# Patient Record
Sex: Female | Born: 1960 | Race: Black or African American | Hispanic: No | Marital: Single | Smoking: Never smoker
Health system: Southern US, Community
[De-identification: ages and names within clinical notes are randomized; demographics above are authoritative.]

## PROBLEM LIST (undated history)

## (undated) DIAGNOSIS — Z21 Asymptomatic human immunodeficiency virus [HIV] infection status: Secondary | ICD-10-CM

## (undated) DIAGNOSIS — M170 Bilateral primary osteoarthritis of knee: Secondary | ICD-10-CM

## (undated) DIAGNOSIS — I10 Essential (primary) hypertension: Secondary | ICD-10-CM

## (undated) DIAGNOSIS — J45909 Unspecified asthma, uncomplicated: Secondary | ICD-10-CM

## (undated) DIAGNOSIS — E785 Hyperlipidemia, unspecified: Secondary | ICD-10-CM

## (undated) DIAGNOSIS — M549 Dorsalgia, unspecified: Secondary | ICD-10-CM

## (undated) DIAGNOSIS — F419 Anxiety disorder, unspecified: Secondary | ICD-10-CM

## (undated) DIAGNOSIS — B20 Human immunodeficiency virus [HIV] disease: Secondary | ICD-10-CM

## (undated) DIAGNOSIS — R000 Tachycardia, unspecified: Secondary | ICD-10-CM

## (undated) DIAGNOSIS — F32A Depression, unspecified: Secondary | ICD-10-CM

## (undated) DIAGNOSIS — T7840XA Allergy, unspecified, initial encounter: Secondary | ICD-10-CM

## (undated) DIAGNOSIS — K219 Gastro-esophageal reflux disease without esophagitis: Secondary | ICD-10-CM

## (undated) HISTORY — DX: Anxiety disorder, unspecified: F41.9

## (undated) HISTORY — DX: Hyperlipidemia, unspecified: E78.5

## (undated) HISTORY — DX: Essential (primary) hypertension: I10

## (undated) HISTORY — DX: Depression, unspecified: F32.A

## (undated) HISTORY — DX: Allergy, unspecified, initial encounter: T78.40XA

## (undated) HISTORY — DX: Dorsalgia, unspecified: M54.9

## (undated) HISTORY — PX: NO PAST SURGERIES: SHX2092

## (undated) HISTORY — DX: Tachycardia, unspecified: R00.0

## (undated) HISTORY — DX: Human immunodeficiency virus (HIV) disease: B20

## (undated) HISTORY — DX: Asymptomatic human immunodeficiency virus (hiv) infection status: Z21

## (undated) HISTORY — DX: Bilateral primary osteoarthritis of knee: M17.0

---

## 2006-03-19 ENCOUNTER — Ambulatory Visit: Payer: Self-pay | Admitting: Infectious Diseases

## 2006-08-21 ENCOUNTER — Ambulatory Visit: Payer: Self-pay | Admitting: Infectious Diseases

## 2007-01-30 ENCOUNTER — Ambulatory Visit: Payer: Self-pay | Admitting: Infectious Diseases

## 2007-02-26 ENCOUNTER — Encounter: Payer: Self-pay | Admitting: Infectious Diseases

## 2007-02-28 ENCOUNTER — Encounter: Payer: Self-pay | Admitting: Infectious Diseases

## 2007-03-07 ENCOUNTER — Encounter: Admission: RE | Admit: 2007-03-07 | Discharge: 2007-03-07 | Payer: Self-pay | Admitting: Infectious Diseases

## 2007-03-07 ENCOUNTER — Ambulatory Visit: Payer: Self-pay | Admitting: Infectious Diseases

## 2007-03-07 ENCOUNTER — Encounter (INDEPENDENT_AMBULATORY_CARE_PROVIDER_SITE_OTHER): Payer: Self-pay | Admitting: *Deleted

## 2007-03-07 DIAGNOSIS — B20 Human immunodeficiency virus [HIV] disease: Secondary | ICD-10-CM | POA: Insufficient documentation

## 2007-03-07 LAB — CONVERTED CEMR LAB
ALT: 32 units/L (ref 0–35)
AST: 34 units/L (ref 0–37)
Basophils Relative: 0 % (ref 0–1)
CO2: 17 meq/L — ABNORMAL LOW (ref 19–32)
Chlamydia, Swab/Urine, PCR: POSITIVE — AB
Chloride: 107 meq/L (ref 96–112)
Cholesterol: 299 mg/dL — ABNORMAL HIGH (ref 0–200)
Creatinine, Ser: 0.82 mg/dL (ref 0.40–1.20)
HCT: 43.2 % (ref 36.0–46.0)
HIV-2 Ab: UNDETERMINED — AB
Hemoglobin: 14.5 g/dL (ref 12.0–15.0)
Hep B Core Total Ab: POSITIVE — AB
Ketones, ur: NEGATIVE mg/dL
Lymphs Abs: 2.9 10*3/uL (ref 0.7–3.3)
Monocytes Absolute: 0.5 10*3/uL (ref 0.2–0.7)
Monocytes Relative: 7 % (ref 3–11)
Platelets: 322 10*3/uL (ref 150–400)
Protein, ur: NEGATIVE mg/dL
RBC: 4.39 M/uL (ref 3.87–5.11)
RDW: 13.4 % (ref 11.5–14.0)
Specific Gravity, Urine: 1.031 (ref 1.005–1.03)
Total Bilirubin: 0.2 mg/dL — ABNORMAL LOW (ref 0.3–1.2)
Total Protein: 7.6 g/dL (ref 6.0–8.3)
Triglycerides: 379 mg/dL — ABNORMAL HIGH (ref <150)
Urine Glucose: NEGATIVE mg/dL
VLDL: 76 mg/dL — ABNORMAL HIGH (ref 0–40)
pH: 5.5 (ref 5.0–8.0)

## 2007-03-11 ENCOUNTER — Telehealth: Payer: Self-pay

## 2007-03-19 ENCOUNTER — Telehealth: Payer: Self-pay | Admitting: Infectious Diseases

## 2007-04-08 ENCOUNTER — Telehealth: Payer: Self-pay | Admitting: Infectious Diseases

## 2007-04-11 ENCOUNTER — Encounter (INDEPENDENT_AMBULATORY_CARE_PROVIDER_SITE_OTHER): Payer: Self-pay | Admitting: *Deleted

## 2007-04-11 ENCOUNTER — Telehealth: Payer: Self-pay | Admitting: Infectious Diseases

## 2007-04-15 ENCOUNTER — Ambulatory Visit: Payer: Self-pay | Admitting: Infectious Diseases

## 2007-04-15 DIAGNOSIS — I10 Essential (primary) hypertension: Secondary | ICD-10-CM | POA: Insufficient documentation

## 2007-04-15 DIAGNOSIS — F4310 Post-traumatic stress disorder, unspecified: Secondary | ICD-10-CM

## 2007-04-15 DIAGNOSIS — J45909 Unspecified asthma, uncomplicated: Secondary | ICD-10-CM

## 2007-04-15 DIAGNOSIS — F329 Major depressive disorder, single episode, unspecified: Secondary | ICD-10-CM | POA: Insufficient documentation

## 2007-04-17 ENCOUNTER — Telehealth: Payer: Self-pay | Admitting: Infectious Diseases

## 2007-04-23 ENCOUNTER — Telehealth: Payer: Self-pay | Admitting: Infectious Diseases

## 2007-05-07 ENCOUNTER — Ambulatory Visit: Payer: Self-pay

## 2007-05-07 ENCOUNTER — Encounter: Payer: Self-pay | Admitting: Infectious Diseases

## 2007-05-20 ENCOUNTER — Telehealth: Payer: Self-pay | Admitting: Infectious Diseases

## 2007-05-22 ENCOUNTER — Telehealth (INDEPENDENT_AMBULATORY_CARE_PROVIDER_SITE_OTHER): Payer: Self-pay | Admitting: *Deleted

## 2007-05-30 ENCOUNTER — Telehealth: Payer: Self-pay | Admitting: Infectious Diseases

## 2007-06-03 ENCOUNTER — Encounter: Payer: Self-pay | Admitting: Infectious Diseases

## 2007-06-03 ENCOUNTER — Telehealth: Payer: Self-pay | Admitting: Infectious Diseases

## 2007-06-26 ENCOUNTER — Telehealth: Payer: Self-pay | Admitting: Infectious Diseases

## 2007-07-10 ENCOUNTER — Telehealth (INDEPENDENT_AMBULATORY_CARE_PROVIDER_SITE_OTHER): Payer: Self-pay | Admitting: *Deleted

## 2007-07-22 ENCOUNTER — Telehealth: Payer: Self-pay | Admitting: Infectious Diseases

## 2007-07-22 ENCOUNTER — Ambulatory Visit: Payer: Self-pay | Admitting: Infectious Diseases

## 2007-07-22 ENCOUNTER — Encounter: Admission: RE | Admit: 2007-07-22 | Discharge: 2007-07-22 | Payer: Self-pay | Admitting: Infectious Diseases

## 2007-07-22 LAB — CONVERTED CEMR LAB
ALT: 30 units/L (ref 0–35)
AST: 25 units/L (ref 0–37)
Basophils Absolute: 0 10*3/uL (ref 0.0–0.1)
CO2: 23 meq/L (ref 19–32)
Glucose, Bld: 90 mg/dL (ref 70–99)
Lymphs Abs: 2.1 10*3/uL (ref 0.7–3.3)
MCHC: 33.3 g/dL (ref 30.0–36.0)
MCV: 96.4 fL (ref 78.0–100.0)
Monocytes Absolute: 0.3 10*3/uL (ref 0.2–0.7)
Monocytes Relative: 6 % (ref 3–11)
Potassium: 4.7 meq/L (ref 3.5–5.3)
RBC: 4.43 M/uL (ref 3.87–5.11)
RDW: 13.1 % (ref 11.5–14.0)
Sodium: 140 meq/L (ref 135–145)
WBC: 4.9 10*3/uL (ref 4.0–10.5)

## 2007-09-04 ENCOUNTER — Telehealth: Payer: Self-pay | Admitting: Infectious Diseases

## 2007-09-09 ENCOUNTER — Ambulatory Visit: Payer: Self-pay | Admitting: Infectious Diseases

## 2007-09-11 ENCOUNTER — Encounter: Payer: Self-pay | Admitting: Infectious Diseases

## 2007-09-17 ENCOUNTER — Telehealth: Payer: Self-pay | Admitting: Infectious Diseases

## 2007-09-20 ENCOUNTER — Inpatient Hospital Stay: Payer: Self-pay | Admitting: Vascular Surgery

## 2007-09-25 ENCOUNTER — Telehealth: Payer: Self-pay | Admitting: Infectious Diseases

## 2007-10-07 ENCOUNTER — Telehealth: Payer: Self-pay | Admitting: Infectious Diseases

## 2007-10-07 ENCOUNTER — Encounter: Payer: Self-pay | Admitting: Infectious Diseases

## 2007-11-08 ENCOUNTER — Telehealth: Payer: Self-pay | Admitting: Infectious Diseases

## 2007-11-25 ENCOUNTER — Encounter: Payer: Self-pay | Admitting: Infectious Diseases

## 2007-11-26 ENCOUNTER — Encounter (INDEPENDENT_AMBULATORY_CARE_PROVIDER_SITE_OTHER): Payer: Self-pay | Admitting: *Deleted

## 2008-01-29 ENCOUNTER — Ambulatory Visit: Payer: Self-pay | Admitting: Infectious Diseases

## 2008-01-29 ENCOUNTER — Encounter: Admission: RE | Admit: 2008-01-29 | Discharge: 2008-01-29 | Payer: Self-pay | Admitting: Infectious Diseases

## 2008-01-29 LAB — CONVERTED CEMR LAB: HIV-1 RNA Quant, Log: <1.7 (ref <1.70)

## 2008-01-31 LAB — CONVERTED CEMR LAB
AST: 27 units/L (ref 0–37)
Albumin: 4.3 g/dL (ref 3.5–5.2)
Alkaline Phosphatase: 77 units/L (ref 39–117)
BUN: 16 mg/dL (ref 6–23)
Calcium: 9.6 mg/dL (ref 8.4–10.5)
HDL: 89 mg/dL (ref >39)
Hemoglobin: 12.7 g/dL (ref 12.0–15.0)
LDL Cholesterol: 165 mg/dL — ABNORMAL HIGH (ref 0–99)
MCHC: 32.1 g/dL (ref 30.0–36.0)
Monocytes Absolute: 0.3 10*3/uL (ref 0.1–1.0)
Monocytes Relative: 5 % (ref 3–12)
Neutro Abs: 2.8 10*3/uL (ref 1.7–7.7)
Neutrophils Relative %: 51 % (ref 43–77)
Platelets: 305 10*3/uL (ref 150–400)
Total CHOL/HDL Ratio: 3.2
Total Protein: 7.2 g/dL (ref 6.0–8.3)
WBC: 5.5 10*3/uL (ref 4.0–10.5)

## 2008-02-14 ENCOUNTER — Encounter (INDEPENDENT_AMBULATORY_CARE_PROVIDER_SITE_OTHER): Payer: Self-pay | Admitting: *Deleted

## 2008-04-01 ENCOUNTER — Ambulatory Visit: Payer: Self-pay | Admitting: Infectious Diseases

## 2008-04-01 DIAGNOSIS — K219 Gastro-esophageal reflux disease without esophagitis: Secondary | ICD-10-CM

## 2008-04-14 ENCOUNTER — Encounter: Payer: Self-pay | Admitting: Infectious Diseases

## 2008-09-14 ENCOUNTER — Ambulatory Visit: Payer: Self-pay | Admitting: Infectious Diseases

## 2008-09-14 ENCOUNTER — Encounter: Payer: Self-pay | Admitting: Infectious Diseases

## 2008-09-14 ENCOUNTER — Ambulatory Visit: Payer: Self-pay | Admitting: Internal Medicine

## 2008-09-14 LAB — CONVERTED CEMR LAB
ALT: 36 units/L — ABNORMAL HIGH (ref 0–35)
Alkaline Phosphatase: 92 units/L (ref 39–117)
BUN: 14 mg/dL (ref 6–23)
Basophils Relative: 0 % (ref 0–1)
Calcium: 9.5 mg/dL (ref 8.4–10.5)
Chloride: 104 meq/L (ref 96–112)
Eosinophils Absolute: 0.2 10*3/uL (ref 0.0–0.7)
Eosinophils Relative: 3 % (ref 0–5)
Lymphocytes Relative: 50 % — ABNORMAL HIGH (ref 12–46)
MCHC: 34.6 g/dL (ref 30.0–36.0)
MCV: 94.2 fL (ref 78.0–100.0)
Platelets: 336 10*3/uL (ref 150–400)
Potassium: 4.7 meq/L (ref 3.5–5.3)
Total Bilirubin: 0.3 mg/dL (ref 0.3–1.2)

## 2008-09-28 ENCOUNTER — Encounter (INDEPENDENT_AMBULATORY_CARE_PROVIDER_SITE_OTHER): Payer: Self-pay | Admitting: *Deleted

## 2008-09-28 ENCOUNTER — Ambulatory Visit: Payer: Self-pay | Admitting: Infectious Diseases

## 2009-03-02 ENCOUNTER — Encounter: Payer: Self-pay | Admitting: Infectious Diseases

## 2009-04-06 ENCOUNTER — Telehealth: Payer: Self-pay | Admitting: Infectious Diseases

## 2009-04-14 ENCOUNTER — Ambulatory Visit: Payer: Self-pay | Admitting: Infectious Diseases

## 2009-04-14 LAB — CONVERTED CEMR LAB
AST: 34 units/L (ref 0–37)
Alkaline Phosphatase: 87 units/L (ref 39–117)
BUN: 16 mg/dL (ref 6–23)
Basophils Absolute: 0 10*3/uL (ref 0.0–0.1)
Basophils Relative: 0 % (ref 0–1)
Calcium: 9.7 mg/dL (ref 8.4–10.5)
Cholesterol: 281 mg/dL — ABNORMAL HIGH (ref 0–200)
Creatinine, Ser: 0.71 mg/dL (ref 0.40–1.20)
Eosinophils Absolute: 0.2 10*3/uL (ref 0.0–0.7)
GFR calc Af Amer: >60 mL/min (ref >60)
GFR calc non Af Amer: >60 mL/min (ref >60)
HCT: 40.9 % (ref 36.0–46.0)
HDL: 75 mg/dL (ref >39)
HIV 1 RNA Quant: <48 {copies}/mL
HIV-1 RNA Quant, Log: <1.68
LDL Cholesterol: 179 mg/dL — ABNORMAL HIGH (ref 0–99)
MCV: 96.5 fL (ref 78.0–100.0)
Neutro Abs: 3.6 10*3/uL (ref 1.7–7.7)
Platelets: 307 10*3/uL (ref 150–400)
Potassium: 4.9 meq/L (ref 3.5–5.3)
Sodium: 140 meq/L (ref 135–145)
Total Bilirubin: 0.2 mg/dL — ABNORMAL LOW (ref 0.3–1.2)
Total CHOL/HDL Ratio: 3.7
Triglycerides: 135 mg/dL (ref <150)
VLDL: 27 mg/dL (ref 0–40)

## 2009-04-20 ENCOUNTER — Telehealth (INDEPENDENT_AMBULATORY_CARE_PROVIDER_SITE_OTHER): Payer: Self-pay | Admitting: *Deleted

## 2009-05-03 ENCOUNTER — Ambulatory Visit: Payer: Self-pay | Admitting: Infectious Diseases

## 2009-05-06 ENCOUNTER — Telehealth: Payer: Self-pay | Admitting: Infectious Diseases

## 2009-09-24 ENCOUNTER — Encounter: Payer: Self-pay | Admitting: Infectious Diseases

## 2009-09-24 ENCOUNTER — Encounter (INDEPENDENT_AMBULATORY_CARE_PROVIDER_SITE_OTHER): Payer: Self-pay | Admitting: *Deleted

## 2009-09-24 ENCOUNTER — Ambulatory Visit: Payer: Self-pay | Admitting: Internal Medicine

## 2009-09-24 ENCOUNTER — Ambulatory Visit: Payer: Self-pay | Admitting: Infectious Diseases

## 2009-09-27 ENCOUNTER — Telehealth: Payer: Self-pay | Admitting: Infectious Diseases

## 2009-09-28 ENCOUNTER — Encounter: Payer: Self-pay | Admitting: Infectious Diseases

## 2009-09-29 ENCOUNTER — Encounter: Payer: Self-pay | Admitting: Infectious Diseases

## 2009-10-04 ENCOUNTER — Ambulatory Visit: Payer: Self-pay | Admitting: Infectious Diseases

## 2009-10-04 ENCOUNTER — Telehealth: Payer: Self-pay | Admitting: Infectious Diseases

## 2009-10-04 ENCOUNTER — Ambulatory Visit (HOSPITAL_COMMUNITY): Admission: RE | Admit: 2009-10-04 | Discharge: 2009-10-04 | Payer: Self-pay | Admitting: Infectious Diseases

## 2009-10-04 LAB — CONVERTED CEMR LAB
ALT: 25 units/L (ref 0–35)
AST: 19 units/L (ref 0–37)
Albumin: 4.3 g/dL (ref 3.5–5.2)
Alkaline Phosphatase: 83 units/L (ref 39–117)
Basophils Absolute: 0 10*3/uL (ref 0.0–0.1)
Basophils Relative: 0 % (ref 0–1)
Calcium: 9.4 mg/dL (ref 8.4–10.5)
Eosinophils Relative: 2 % (ref 0–5)
HCT: 39.9 % (ref 36.0–46.0)
HIV 1 RNA Quant: 55 copies/mL — ABNORMAL HIGH (ref <48)
Hemoglobin: 13.2 g/dL (ref 12.0–15.0)
MCV: 96.1 fL (ref >=78.0)
Potassium: 4.6 meq/L (ref 3.5–5.3)
RBC: 4.15 M/uL (ref 3.87–5.11)
Total Protein: 7 g/dL (ref 6.0–8.3)
WBC: 10.4 10*3/uL (ref 4.0–10.5)

## 2009-10-05 ENCOUNTER — Encounter (INDEPENDENT_AMBULATORY_CARE_PROVIDER_SITE_OTHER): Payer: Self-pay | Admitting: *Deleted

## 2009-10-15 ENCOUNTER — Telehealth (INDEPENDENT_AMBULATORY_CARE_PROVIDER_SITE_OTHER): Payer: Self-pay | Admitting: *Deleted

## 2009-10-18 ENCOUNTER — Ambulatory Visit: Payer: Self-pay | Admitting: Infectious Diseases

## 2009-10-25 ENCOUNTER — Telehealth: Payer: Self-pay | Admitting: Infectious Diseases

## 2009-10-29 ENCOUNTER — Encounter: Payer: Self-pay | Admitting: Infectious Diseases

## 2009-11-15 ENCOUNTER — Telehealth: Payer: Self-pay | Admitting: Infectious Diseases

## 2009-12-01 ENCOUNTER — Telehealth (INDEPENDENT_AMBULATORY_CARE_PROVIDER_SITE_OTHER): Payer: Self-pay | Admitting: *Deleted

## 2009-12-06 ENCOUNTER — Telehealth (INDEPENDENT_AMBULATORY_CARE_PROVIDER_SITE_OTHER): Payer: Self-pay | Admitting: *Deleted

## 2009-12-07 ENCOUNTER — Encounter: Payer: Self-pay | Admitting: Infectious Diseases

## 2009-12-29 ENCOUNTER — Telehealth (INDEPENDENT_AMBULATORY_CARE_PROVIDER_SITE_OTHER): Payer: Self-pay | Admitting: *Deleted

## 2009-12-31 ENCOUNTER — Encounter: Payer: Self-pay | Admitting: Infectious Diseases

## 2010-03-14 ENCOUNTER — Encounter: Payer: Self-pay | Admitting: Infectious Diseases

## 2010-04-27 ENCOUNTER — Ambulatory Visit: Payer: Self-pay | Admitting: Infectious Diseases

## 2010-04-27 LAB — CONVERTED CEMR LAB
ALT: 27 units/L (ref 0–35)
Albumin: 4.3 g/dL (ref 3.5–5.2)
Alkaline Phosphatase: 68 units/L (ref 39–117)
BUN: 11 mg/dL (ref 6–23)
CO2: 22 meq/L (ref 19–32)
Cholesterol: 297 mg/dL — ABNORMAL HIGH (ref 0–200)
Creatinine, Ser: 0.68 mg/dL (ref 0.40–1.20)
Eosinophils Relative: 3 % (ref 0–5)
Glucose, Bld: 123 mg/dL — ABNORMAL HIGH (ref 70–99)
Hemoglobin: 13 g/dL (ref 12.0–15.0)
Lymphs Abs: 3.3 10*3/uL (ref 0.7–4.0)
MCHC: 33.8 g/dL (ref 30.0–36.0)
MCV: 94.1 fL (ref 78.0–100.0)
Monocytes Relative: 6 % (ref 3–12)
Platelets: 308 10*3/uL (ref 150–400)
RBC: 4.09 M/uL (ref 3.87–5.11)
RDW: 12.7 % (ref 11.5–15.5)
Sodium: 138 meq/L (ref 135–145)
Total CHOL/HDL Ratio: 3.8
Total Protein: 6.8 g/dL (ref 6.0–8.3)
Triglycerides: 152 mg/dL — ABNORMAL HIGH (ref <150)
VLDL: 30 mg/dL (ref 0–40)

## 2010-05-23 ENCOUNTER — Ambulatory Visit: Payer: Self-pay | Admitting: Infectious Diseases

## 2010-05-23 DIAGNOSIS — E785 Hyperlipidemia, unspecified: Secondary | ICD-10-CM | POA: Insufficient documentation

## 2010-05-26 ENCOUNTER — Telehealth (INDEPENDENT_AMBULATORY_CARE_PROVIDER_SITE_OTHER): Payer: Self-pay | Admitting: *Deleted

## 2010-09-12 ENCOUNTER — Ambulatory Visit: Payer: Self-pay | Admitting: Infectious Diseases

## 2010-09-12 LAB — CONVERTED CEMR LAB
ALT: 25 units/L (ref 0–35)
AST: 21 units/L (ref 0–37)
BUN: 12 mg/dL (ref 6–23)
Basophils Absolute: 0 10*3/uL (ref 0.0–0.1)
CO2: 21 meq/L (ref 19–32)
Glucose, Bld: 98 mg/dL (ref 70–99)
HDL: 70 mg/dL (ref >39)
HIV 1 RNA Quant: <20 copies/mL (ref <20)
HIV-1 RNA Quant, Log: <1.3 (ref <1.30)
Hemoglobin: 13.5 g/dL (ref 12.0–15.0)
Lymphocytes Relative: 48 % — ABNORMAL HIGH (ref 12–46)
Lymphs Abs: 2.8 10*3/uL (ref 0.7–4.0)
MCHC: 34.1 g/dL (ref 30.0–36.0)
Monocytes Absolute: 0.4 10*3/uL (ref 0.1–1.0)
Neutrophils Relative %: 42 % — ABNORMAL LOW (ref 43–77)
Platelets: 315 10*3/uL (ref 150–400)
RBC: 4.2 M/uL (ref 3.87–5.11)
RDW: 12.9 % (ref 11.5–15.5)
Total Bilirubin: 0.2 mg/dL — ABNORMAL LOW (ref 0.3–1.2)
Total CHOL/HDL Ratio: 3.9
Total Protein: 6.5 g/dL (ref 6.0–8.3)
Triglycerides: 183 mg/dL — ABNORMAL HIGH (ref <150)
VLDL: 37 mg/dL (ref 0–40)

## 2010-09-16 ENCOUNTER — Ambulatory Visit: Payer: Self-pay | Admitting: Infectious Diseases

## 2010-09-22 ENCOUNTER — Encounter: Payer: Self-pay | Admitting: Infectious Diseases

## 2010-09-28 ENCOUNTER — Ambulatory Visit: Payer: Self-pay | Admitting: Infectious Diseases

## 2010-10-24 ENCOUNTER — Encounter (INDEPENDENT_AMBULATORY_CARE_PROVIDER_SITE_OTHER): Payer: Self-pay | Admitting: *Deleted

## 2010-11-09 ENCOUNTER — Encounter (INDEPENDENT_AMBULATORY_CARE_PROVIDER_SITE_OTHER): Payer: Self-pay | Admitting: *Deleted

## 2010-11-17 ENCOUNTER — Ambulatory Visit (HOSPITAL_COMMUNITY)
Admission: RE | Admit: 2010-11-17 | Discharge: 2010-11-17 | Payer: Self-pay | Source: Home / Self Care | Attending: Infectious Diseases | Admitting: Infectious Diseases

## 2010-12-19 ENCOUNTER — Encounter: Payer: Self-pay | Admitting: Infectious Diseases

## 2010-12-27 NOTE — Letter (Signed)
Summary: Results Follow-up Letter  [facility] for Infectious Disease  [address]   [place], [place] [address]   Phone: [phone]  Fax: [phone]          September 22, 2010  [address] [place], [place]  [address]  Dear Ms. Zegarra,   The following are the results of your recent test(s):  Test     Result     Pap Smear    Normal_XXX___  Not Normal_____  Comments:  Everything was normal.  I will see you in one year for your next PAP smear.    Thank you for coming to the Center for your care.  Sincerely,    Jennet Maduro [facility] for Infectious Disease

## 2010-12-27 NOTE — Consult Note (Signed)
Summary: [facility]   Imported By: Florinda Marker 12/07/2009 14:20:55  _____________________________________________________________________  External Attachment:    Type:   Image     Comment:   External Document

## 2010-12-27 NOTE — Progress Notes (Signed)
Summary: Referral for MNT/dmr  Phone Note Outgoing Call   Summary of Call: Tried calling patient to explain that Medicare benefits do not cover MNT for her diagnosis and invite her to attend free extremem makeover group meeting July 12th. no answer. Mailed letter with appointment day and time for MNT, likelihood of no insurance coverage and flyer for free class as well as [facility] phone number at [facility] for her to check to see if they have any services that could benefit her.

## 2010-12-27 NOTE — Miscellaneous (Signed)
Summary: RW Financial Update  Clinical Lists Changes  Observations: Added new observation of FINASSESSDT: 09/28/2010 (10/24/2010 13:55) Added new observation of YEARLYEXPEN: 0  (10/24/2010 13:55)

## 2010-12-27 NOTE — Assessment & Plan Note (Signed)
Summary: PAP SMEAR + "Cyst-like" growth on inner L thigh, ? Surg. Ref?   Vitals Entered By: Sheri Maduro RN (September 16, 2010 2:13 PM)  Patient Instructions: 1)  Please schedule a follow-up appointment in 1 year. 2)  I will send your results to in about a week. 3)  Thank you for coming to the Center for your care.  Sheri Davis  CC: PAP smear visit. Given condoms.  Given educastional materials re:  HIV and women, self esteem, BSE, diet, exercise and nutrition.  Pt. asked RN to look at "cyst" on her thigh.  It was a problem this summer when she was walking more on her vacation.  RN reminded pt. to talk with Dr. Ninetta Davis about this at her next visit.  Is Patient Diabetic? No  Prior Medications: ATRIPLA 600-200-300 MG TABS (EFAVIRENZ-EMTRICITAB-TENOFOVIR) Take 1 tablet by mouth at bedtime ATENOLOL 25 MG TABS (ATENOLOL) Take 1 tablet by mouth once a day TRIAMCINOLONE ACETONIDE 0.1 % CREA (TRIAMCINOLONE ACETONIDE) two times a day as needed to affected areas CROMOLYN SODIUM 4 % SOLN (CROMOLYN SODIUM) as needed for eye dyscomfort VENTOLIN HFA 108 (90 BASE) MCG/ACT AERS (ALBUTEROL SULFATE) 1 puff qid as needed for shortness of breath FLOVENT HFA 110 MCG/ACT AERO (FLUTICASONE PROPIONATE  HFA) once daily inhaled LORAZEPAM 1 MG  TABS (LORAZEPAM) One tablet by mouth every eight hours as needed PREVACID 30 MG CPDR (LANSOPRAZOLE) take two daily Current Allergies: No known allergies  Orders Added: 1)  T-PAP [facility]) [[id number]] 2)  Est. Patient Level I [[address]]

## 2010-12-27 NOTE — Assessment & Plan Note (Signed)
Summary: F/U [MKJ]   CC:  follow-up visit.  History of Present Illness: 50 yo F with HIV+ 2007.  Has been on atripla since dx. Also taking herbal supplements.  CD4 1100  VL <20, Chol 262 (09-12-10).  "I'm fine, getting fatter and fatter." no problems with ART. Has had cyst on her inner thigh for 20+ years, has not grown. Was irritating to her at her last vacation when she was up walking alot.   Preventive Screening-Counseling & Management  Alcohol-Tobacco     Alcohol drinks/day: 0     Smoking Status: quit     Packs/Day: 1     Year Quit: 2008  Caffeine-Diet-Exercise     Caffeine use/day: herbal tea     Does Patient Exercise: yes     Type of exercise: walking and going to the [facility]     Exercise (avg: min/session): 30-60     Times/week: 3  Hep-HIV-STD-Contraception     HIV Risk: no risk noted     HIV Risk Counseling: not indicated-no HIV risk noted  Safety-Violence-Falls     Seat Belt Use: yes  Comments: given condoms      Sexual History:  n/a.        Drug Use:  never.     Updated Prior Medication List: ATRIPLA 600-200-300 MG TABS (EFAVIRENZ-EMTRICITAB-TENOFOVIR) Take 1 tablet by mouth at bedtime ATENOLOL 25 MG TABS (ATENOLOL) Take 1 tablet by mouth once a day TRIAMCINOLONE ACETONIDE 0.1 % CREA (TRIAMCINOLONE ACETONIDE) two times a day as needed to affected areas CROMOLYN SODIUM 4 % SOLN (CROMOLYN SODIUM) as needed for eye dyscomfort VENTOLIN HFA 108 (90 BASE) MCG/ACT AERS (ALBUTEROL SULFATE) 1 puff qid as needed for shortness of breath FLOVENT HFA 110 MCG/ACT AERO (FLUTICASONE PROPIONATE  HFA) once daily inhaled LORAZEPAM 1 MG  TABS (LORAZEPAM) One tablet by mouth every eight hours as needed PREVACID 30 MG CPDR (LANSOPRAZOLE) take two daily  Current Allergies (reviewed today): No known allergies  Past History:  Past medical, surgical, family and social histories (including risk factors) reviewed, and no changes noted (except as noted below).  Past Medical  History: Reviewed history from 05/23/2010 and no changes required. Depression HIV disease Asthma Hypertension GERD Hyperlipidemia  Family History: Reviewed history from 10/18/2009 and no changes required. mother deceased with CHF, depression, bipolar. diabetes.   Social History: Reviewed history from 05/03/2009 and no changes required. Alcohol use-no Drug use-no Former Smoker Sexual History:  n/a Drug Use:  never  Vital Signs:  Patient profile:   50 year old female Menstrual status:  postmenopausal Height:      65 inches (165.10 cm) Weight:      233.25 pounds (106.02 kg) BMI:     38.96 Temp:     99.0 degrees F (37.22 degrees C) oral Pulse rate:   80 / minute BP sitting:   117 / 81  (right arm) Cuff size:   large  Vitals Entered By: Jennet Maduro RN (September 28, 2010 4:19 PM) CC: follow-up visit Is Patient Diabetic? No Pain Assessment Patient in pain? no      Nutritional Status BMI of > 30 = obese Nutritional Status Detail appetite "great"  Have you ever been in a relationship where you felt threatened, hurt or afraid?No   Does patient need assistance? Functional Status Self care Ambulation Normal Comments noi missed doses of rxes   Physical Exam  General:  well-developed, well-nourished, and well-hydrated.   Eyes:  pupils equal, pupils round, and pupils reactive  to light.   Mouth:  pharynx pink and moist and no exudates.   Neck:  no masses.   Lungs:  normal respiratory effort and normal breath sounds.   Heart:  normal rate, regular rhythm, and no murmur.   Abdomen:  soft, non-tender, and normal bowel sounds.   Skin:  2-3 cm protruding lesion on her L upper thigh. it is reducible and has a cystic feel. there is  no increase in heat or tenderness.         Medication Adherence: 09/28/2010   Adherence to medications reviewed with patient. Counseling to provide adequate adherence provided   Prevention For Positives: 09/28/2010   Safe sex practices  discussed with patient. Condoms offered.                             Impression & Recommendations:  Problem # 1:  HIV DISEASE (ICD-042)  she is doing well. she is given condoms ("i'm celebate"). will cont her ART. will refer her to surgery for eval of the lesion on her leg. return to clinic 5-6 months.   Orders: Surgical Referral (Surgery)Future Orders: T-TSH ([id number]) ... 12/27/2010  Problem # 2:  HYPERLIPIDEMIA (ICD-272.4)  she asks about wt loss- i encouraged her to exercise and watch her diet. cont to watch her lipids. wants TSH checked at next blood draw.    Future Orders: T-TSH [id number]) ... 12/27/2010  Problem # 3:  HYPERTENSION (ICD-401.9) he BP rx is refilled. will cont to watch.  Her updated medication list for this problem includes:    Atenolol 25 Mg Tabs (Atenolol) .Marland Kitchen... Take 1 tablet by mouth once a day  Problem # 4:  G E R D (ICD-530.81) her prevacid is refilled.  Her updated medication list for this problem includes:    Prevacid 30 Mg Cpdr (Lansoprazole) .Marland Kitchen... Take two daily  Medications Added to Medication List This Visit: 1)  Atenolol 25 Mg Tabs (Atenolol) .... Take 1 tablet by mouth once a day  Other Orders: Est. Patient Level IV (284[address]) Future Orders: T-CD4SP ([facility]) (CD4SP) ... 12/27/2010 T-HIV Viral Load [id number]) ... 12/27/2010 T-Comprehensive Metabolic Panel [id number]) ... 12/27/2010 T-CBC w/Diff ([id number]) ... 12/27/2010 T-RPR (Syphilis) [id number]) ... 12/27/2010 T-Lipid Profile [id number]) ... 12/27/2010  Prescriptions: ATENOLOL 25 MG TABS (ATENOLOL) Take 1 tablet by mouth once a day  #90 x 3   Entered and Authorized by:   Johny Sax MD   Signed by:   Johny Sax MD on 09/28/2010   Method used:   Electronically to        [facility] (retail)       [address]       [place], [place]  [address]       Ph: [phone]       Fax: [phone]   RxID:    [id number] ATRIPLA 600-200-300 MG TABS (EFAVIRENZ-EMTRICITAB-TENOFOVIR) Take 1 tablet by mouth at bedtime  #90 x 3   Entered and Authorized by:   Johny Sax MD   Signed by:   Johny Sax MD on 09/28/2010   Method used:   Electronically to        [facility] (retail)       [address]       [place], [place]  [address]       Ph: [phone]  Fax: [phone]   RxID:   [id number] PREVACID 30 MG CPDR (LANSOPRAZOLE) take two daily  #180 x 3   Entered and Authorized by:   Johny Sax MD   Signed by:   Johny Sax MD on 09/28/2010   Method used:   Electronically to        [facility] (retail)       [address]       [place], [place]  [address]       Ph: [phone]       Fax: [phone]   RxID:   [id number] LORAZEPAM 1 MG  TABS (LORAZEPAM) One tablet by mouth every eight hours as needed  #30 x 2   Entered by:   Jennet Maduro RN   Authorized by:   Johny Sax MD   Signed by:   Jennet Maduro RN on 09/28/2010   Method used:   Telephoned to ...       [facility] (retail)       [address]       [place], [place]  [address]       Ph: [phone]       Fax: [phone]   RxID:   [id number] FLOVENT HFA 110 MCG/ACT AERO (FLUTICASONE PROPIONATE  HFA) once daily inhaled  #30 days x 6   Entered by:   Jennet Maduro RN   Authorized by:   Johny Sax MD   Signed by:   Jennet Maduro RN on 09/28/2010   Method used:   Electronically to        [facility] (retail)       [address]       [place], [place]  [address]       Ph: [phone]       Fax: [phone]   RxID:   [id number] VENTOLIN HFA 108 (90 BASE) MCG/ACT AERS (ALBUTEROL SULFATE) 1 puff qid as needed for shortness of breath  #30 days x 6   Entered by:   Jennet Maduro RN   Authorized by:   Johny Sax MD    Signed by:   Jennet Maduro RN on 09/28/2010   Method used:   Electronically to        [facility] (retail)       [address]       [place], [place]  [address]       Ph: [phone]       Fax: [phone]   RxID:   [id number] TRIAMCINOLONE ACETONIDE 0.1 % CREA (TRIAMCINOLONE ACETONIDE) two times a day as needed to affected areas  #30 days x 1   Entered by:   Jennet Maduro RN   Authorized by:   Johny Sax MD   Signed by:   Jennet Maduro RN on 09/28/2010   Method used:   Electronically to        [facility] (retail)       [address]       [place], [place]  [address]       Ph: [phone]       Fax: [phone]   RxID:   [id number]   Process Orders Check Orders Results:     [facility]: Check successful  Tests Sent for requisitioning (September 28, 2010 4:57 PM):     12/27/2010: [facility] -- T-HIV Viral Load [id number] (signed)     12/27/2010: [facility] -- T-Comprehensive Metabolic Panel [[id number]] (signed)     12/27/2010: [facility] -- T-CBC w/Diff [[id number]] (signed)     12/27/2010: [facility] -- T-RPR (Syphilis) [id number] (signed)     12/27/2010: [facility] -- T-Lipid Profile [phone] (signed)     12/27/2010: [facility] -- T-TSH [phone] (signed)     Influenza Immunization History:    Influenza # 1:  Historical (09/12/2010)

## 2010-12-27 NOTE — Assessment & Plan Note (Signed)
Summary: 5 MONTH F/U VS   CC:  5 month follow up.  History of Present Illness: 50 yo F with HIV+ 2007.  Has been on atripla since dx. Also taking herbal supplements.  CD4 1260  VL <48, Chol 297, Glc 123 (05-25-10). Worried about wt gain, has started walking, is doing water aerobics, not watching diet.  worried her part time job will make her lose her benefits.     Preventive Screening-Counseling & Management  Alcohol-Tobacco     Alcohol drinks/day: 0     Smoking Status: quit     Packs/Day: 1     Year Quit: 2008  Caffeine-Diet-Exercise     Caffeine use/day: herbal tea     Does Patient Exercise: yes     Type of exercise: walking and going to the [facility]     Exercise (avg: min/session): 30-60     Times/week: 3  Safety-Violence-Falls     Seat Belt Use: yes  Allergies (verified): No Known Drug Allergies    Updated Prior Medication List: ATRIPLA 600-200-300 MG TABS (EFAVIRENZ-EMTRICITAB-TENOFOVIR) Take 1 tablet by mouth at bedtime ATENOLOL 25 MG TABS (ATENOLOL) Take 1 tablet by mouth once a day TRIAMCINOLONE ACETONIDE 0.1 % CREA (TRIAMCINOLONE ACETONIDE) two times a day as needed to affected areas CROMOLYN SODIUM 4 % SOLN (CROMOLYN SODIUM) as needed for eye dyscomfort ALBUTEROL  AERS (ALBUTEROL AERS) inhaled every 4-6 hours as needed FLOVENT HFA 110 MCG/ACT AERO (FLUTICASONE PROPIONATE  HFA) once daily inhaled LORAZEPAM 1 MG  TABS (LORAZEPAM) One tablet by mouth every eight hours as needed ACIPHEX 20 MG TBEC (RABEPRAZOLE SODIUM) Take 1 tablet by mouth once a day PREVACID 30 MG CPDR (LANSOPRAZOLE) take two daily  Current Allergies (reviewed today): No known allergies  Past History:  Past Medical History: Depression HIV disease Asthma Hypertension GERD Hyperlipidemia  Review of Systems       wt up 17#  Vital Signs:  Patient profile:   50 year old female Menstrual status:  postmenopausal Height:      65 inches (165.10 cm) Weight:      233.0 pounds (105.91  kg) BMI:     38.91 Temp:     98.4 degrees F (36.89 degrees C) oral Pulse rate:   79 / minute BP sitting:   104 / 65  (left arm)  Vitals Entered By: Baxter Hire) (May 23, 2010 3:55 PM)                    CC: 5 month follow up Pain Assessment Patient in pain? no      Nutritional Status BMI of > 30 = obese Nutritional Status Detail appetite is good per patient  Have you ever been in a relationship where you felt threatened, hurt or afraid?No   Does patient need assistance? Functional Status Self care Ambulation Normal        Medication Adherence: 05/23/2010   Adherence to medications reviewed with patient. Counseling to provide adequate adherence provided   Prevention For Positives: 05/23/2010   Safe sex practices discussed with patient. Condoms offered.                             Physical Exam  General:  well-developed, well-nourished, and overweight-appearing.   Eyes:  pupils equal, pupils round, and pupils reactive to light.   Mouth:  pharynx pink and moist and no exudates.   Neck:  no masses.   Lungs:  normal  respiratory effort and normal breath sounds.   Heart:  normal rate, regular rhythm, and no murmur.   Abdomen:  soft, non-tender, and normal bowel sounds.   Extremities:  no edema   Impression & Recommendations:  Problem # 1:  HIV DISEASE (ICD-042)  not sexually active. cont her currnet meds. doing well except for wt gain. get pap/mammo in October with flu shot. return to clinic 6 months with labs prior.   Orders: Nutrition Referral (Nutrition)  Problem # 2:  HYPERLIPIDEMIA (ICD-272.4)  refer to nutrition.cont exerecise.   Orders: Nutrition Referral (Nutrition)  Problem # 3:  GERD (ICD-530.81) will cont on PPI The following medications were removed from the medication list:    Aciphex 20 Mg Tbec (Rabeprazole sodium) .Marland Kitchen... Take 1 tablet by mouth once a day Her updated medication list for this problem includes:     Prevacid 30 Mg Cpdr (Lansoprazole) .Marland Kitchen... Take two daily  Problem # 4:  HYPERTENSION (ICD-401.9) she is juicing celery. taking atenolol every other day. bp well controlled Her updated medication list for this problem includes:    Atenolol 25 Mg Tabs (Atenolol) .Marland Kitchen... Take 1 tablet by mouth once a day  Problem # 5:  DEPRESSION (ICD-311) states that this is better while she is working . not using SSRI anymore.  Her updated medication list for this problem includes:    Lorazepam 1 Mg Tabs (Lorazepam) ..... One tablet by mouth every eight hours as needed  Medications Added to Medication List This Visit: 1)  Ventolin Hfa 108 (90 Base) Mcg/act Aers (Albuterol sulfate) .Marland Kitchen.. 1 puff qid as needed for shortness of breath  Other Orders: Est. Patient Level IV ([address]) Future Orders: T-CD4SP ([facility]) (CD4SP) ... 11/19/2010 T-HIV Viral Load [id number]) ... 11/19/2010 T-Comprehensive Metabolic Panel [id number]) ... 11/19/2010 T-CBC w/Diff ([id number]) ... 11/19/2010 T-Lipid Profile [id number]) ... 11/19/2010  Prescriptions: VENTOLIN HFA 108 (90 BASE) MCG/ACT AERS (ALBUTEROL SULFATE) 1 puff qid as needed for shortness of breath  #30 days x 3   Entered and Authorized by:   Johny Sax MD   Signed by:   Johny Sax MD on 05/23/2010   Method used:   Electronically to        [facility] (retail)       [address]       [place], [place]  [address]       Ph: [phone]       Fax: [phone]   RxID:   [id number] PREVACID 30 MG CPDR (LANSOPRAZOLE) take two daily  #60 x 11   Entered and Authorized by:   Johny Sax MD   Signed by:   Johny Sax MD on 05/23/2010   Method used:   Electronically to        [facility] (retail)       [address]       [place], [place]  [address]       Ph: [phone]       Fax: [phone]   RxID:   [id number] FLOVENT HFA 110 MCG/ACT AERO (FLUTICASONE  PROPIONATE  HFA) once daily inhaled  #30 days x 5   Entered and Authorized by:   Johny Sax MD   Signed by:   Johny Sax MD on 05/23/2010   Method used:   Electronically to        [facility] (retail)  [address]       [place], [place]  [address]       Ph: [phone]       Fax: [phone]   RxID:   [id number] CROMOLYN SODIUM 4 % SOLN (CROMOLYN SODIUM) as needed for eye dyscomfort  #30 days x 12   Entered and Authorized by:   Johny Sax MD   Signed by:   Johny Sax MD on 05/23/2010   Method used:   Electronically to        [facility] (retail)       [address]       [place], [place]  [address]       Ph: [phone]       Fax: [phone]   RxID:   [id number] TRIAMCINOLONE ACETONIDE 0.1 % CREA (TRIAMCINOLONE ACETONIDE) two times a day as needed to affected areas  #30 days x 1   Entered and Authorized by:   Johny Sax MD   Signed by:   Johny Sax MD on 05/23/2010   Method used:   Electronically to        [facility] (retail)       [address]       [place], [place]  [address]       Ph: [phone]       Fax: [phone]   RxID:   [id number] ATENOLOL 25 MG TABS (ATENOLOL) Take 1 tablet by mouth once a day  #60 x 33   Entered and Authorized by:   Johny Sax MD   Signed by:   Johny Sax MD on 05/23/2010   Method used:   Electronically to        [facility] (retail)       [address]       [place], [place]  [address]       Ph: [phone]       Fax: [phone]   RxID:   [id number]

## 2010-12-27 NOTE — Progress Notes (Signed)
  Phone Note Call from Patient   Caller: Patient Summary of Call: Patient called and left message stating that [facility] did not take the type insurance that the patient has. Spoke with patient on 12-01-09 @ 1:25 pm and told her that we received her message and called to verify and was told that they do take the patient insurance and to keep the appointment that has been already scheduled. Kathi Simpers [facility])  December 01, 2009 2:28 PM

## 2010-12-27 NOTE — Letter (Signed)
Summary: Pt. Assistance Program: Drug  Pt. Assistance Program: Drug   Imported By: Florinda Marker 10/10/2010 15:42:13  _____________________________________________________________________  External Attachment:    Type:   Image     Comment:   External Document

## 2010-12-27 NOTE — Progress Notes (Signed)
Summary: Prior authorization denied for Gen. Prevacid  Phone Note From Pharmacy   Caller: [facility] Request: Needs authorization from insurer Summary of Call: Received a fax prior authorization request for the MD office to contact patient's insurance company to get Lansoprazole 30mg  approved for taking 2 capsules a day.  Normally,medication is taken 1 capsule once a day.  Please call [employer] at [phone]. Her ID # is [id number].  Please contact pharmacy and let them know once its been approved or denied. Initial call taken by: Paulo Fruit  BS,CPht II,MPH,  December 29, 2009 3:24 PM  Follow-up for Phone Call        Tried to call patient's insurance company. Was told that wait time would be longer than normal due to inclement weather. After being on hold for 20 min, Was finally transferred to obtian the PA phone number that I need to call to get her medication approved. The phone number is [phone].  the representative transferred me to that number.   Representative gave a confirmation# [id number]. We will see a fax with the status according to rep within  24-48 hours. via fax to [phone]. Follow-up by: Paulo Fruit  BS,CPht II,MPH,  December 29, 2009 4:46 PM  Additional Follow-up for Phone Call Additional follow up Details #1::        Received the response from patient's insurance company that they denied the prior authorization for the Lansoprazole 30mg .  Her pharmacy was notified verbally [phone] and the denial scanned into EMR. Additional Follow-up by: Paulo Fruit  BS,CPht II,MPH,  January 04, 2010 10:40 AM

## 2010-12-27 NOTE — Miscellaneous (Signed)
Summary: labs  Clinical Lists Changes  Problems: Added new problem of ENCOUNTER FOR LONG-TERM USE OF OTHER MEDICATIONS (ICD-V58.69) Orders: Added new Test order of T-Lipid Profile [id number]) - Signed     Process Orders Check Orders Results:     [facility]: Check successful Order queued for requisitioning for [facility]: September 28, 2010 5:00 PM  Tests Sent for requisitioning (September 28, 2010 5:00 PM):     09/28/2010: [facility] -- T-Lipid Profile [phone] (signed)

## 2010-12-27 NOTE — Letter (Signed)
Summary: [facility]: Notice Of Denial Medicare RX  [facility]: Notice Of Denial Medicare RX   Imported By: Florinda Marker 01/05/2010 15:06:09  _____________________________________________________________________  External Attachment:    Type:   Image     Comment:   External Document

## 2010-12-27 NOTE — Miscellaneous (Signed)
Summary: [facility] Providers  HomeCare Providers   Imported By: Florinda Marker 03/22/2010 16:16:09  _____________________________________________________________________  External Attachment:    Type:   Image     Comment:   External Document

## 2010-12-27 NOTE — Progress Notes (Signed)
Summary: Correspondence from [facility]  Phone Note From Other Clinic   Summary of Call: Received fax from [facility] stating that patient was a "NO SHOW" for 12-03-09 appointment. Fax also states that they called patient and left message for patient to call and reschedule. Kathi Simpers [facility])  December 06, 2009 12:41 PM

## 2010-12-29 NOTE — Miscellaneous (Signed)
Summary: Probllem list update  Clinical Lists Changes  Problems: Added new problem of LONG-TERM (CURRENT) USE OF OTHER MEDICATIONS (ICD-V58.69)

## 2011-02-08 LAB — T-HELPER CELL (CD4) - (RCID CLINIC ONLY): CD4 T Cell Abs: 1100 uL (ref 400–2700)

## 2011-03-07 LAB — T-HELPER CELL (CD4) - (RCID CLINIC ONLY): CD4 T Cell Abs: 930 uL (ref 400–2700)

## 2011-03-15 ENCOUNTER — Other Ambulatory Visit: Payer: Self-pay

## 2011-03-16 ENCOUNTER — Other Ambulatory Visit (INDEPENDENT_AMBULATORY_CARE_PROVIDER_SITE_OTHER): Payer: Medicare PPO

## 2011-03-16 DIAGNOSIS — B20 Human immunodeficiency virus [HIV] disease: Secondary | ICD-10-CM

## 2011-03-16 DIAGNOSIS — R635 Abnormal weight gain: Secondary | ICD-10-CM

## 2011-03-16 DIAGNOSIS — Z113 Encounter for screening for infections with a predominantly sexual mode of transmission: Secondary | ICD-10-CM

## 2011-03-16 DIAGNOSIS — Z79899 Other long term (current) drug therapy: Secondary | ICD-10-CM

## 2011-03-17 LAB — LIPID PANEL
Cholesterol: 284 mg/dL — ABNORMAL HIGH (ref 0–200)
Total CHOL/HDL Ratio: 4.3 Ratio
Triglycerides: 204 mg/dL — ABNORMAL HIGH (ref <150)
VLDL: 41 mg/dL — ABNORMAL HIGH (ref 0–40)

## 2011-03-17 LAB — CBC WITH DIFFERENTIAL/PLATELET
Basophils Relative: 0 % (ref 0–1)
Eosinophils Absolute: 0.2 10*3/uL (ref 0.0–0.7)
HCT: 38.8 % (ref 36.0–46.0)
Hemoglobin: 13 g/dL (ref 12.0–15.0)
Monocytes Relative: 5 % (ref 3–12)
Neutro Abs: 3.3 10*3/uL (ref 1.7–7.7)
Neutrophils Relative %: 49 % (ref 43–77)
Platelets: 337 10*3/uL (ref 150–400)
RDW: 12.8 % (ref 11.5–15.5)
WBC: 6.7 10*3/uL (ref 4.0–10.5)

## 2011-03-17 LAB — COMPLETE METABOLIC PANEL WITH GFR
ALT: 26 U/L (ref 0–35)
BUN: 12 mg/dL (ref 6–23)
CO2: 24 mEq/L (ref 19–32)
Chloride: 105 mEq/L (ref 96–112)
Creat: 0.72 mg/dL (ref 0.40–1.20)
Sodium: 138 mEq/L (ref 135–145)
Total Bilirubin: 0.3 mg/dL (ref 0.3–1.2)

## 2011-03-17 LAB — T-HELPER CELL (CD4) - (RCID CLINIC ONLY): CD4 % Helper T Cell: 42 % (ref 33–55)

## 2011-03-17 LAB — HIV-1 RNA QUANT-NO REFLEX-BLD: HIV 1 RNA Quant: <20 copies/mL (ref <20)

## 2011-03-29 ENCOUNTER — Ambulatory Visit: Payer: Self-pay | Admitting: Infectious Diseases

## 2011-03-29 ENCOUNTER — Encounter: Payer: Self-pay | Admitting: Infectious Diseases

## 2011-03-29 ENCOUNTER — Ambulatory Visit (INDEPENDENT_AMBULATORY_CARE_PROVIDER_SITE_OTHER): Payer: Medicare PPO | Admitting: Infectious Diseases

## 2011-03-29 DIAGNOSIS — J45909 Unspecified asthma, uncomplicated: Secondary | ICD-10-CM

## 2011-03-29 DIAGNOSIS — E785 Hyperlipidemia, unspecified: Secondary | ICD-10-CM

## 2011-03-29 DIAGNOSIS — K219 Gastro-esophageal reflux disease without esophagitis: Secondary | ICD-10-CM

## 2011-03-29 DIAGNOSIS — Z531 Procedure and treatment not carried out because of patient's decision for reasons of belief and group pressure: Secondary | ICD-10-CM | POA: Insufficient documentation

## 2011-03-29 DIAGNOSIS — B20 Human immunodeficiency virus [HIV] disease: Secondary | ICD-10-CM

## 2011-03-29 NOTE — Progress Notes (Signed)
  Subjective:    Patient ID: Sheri Davis, female    DOB: [birth year], 50 y.o.   MRN: [id number]  HPI 50 yo F with HIV+ 2007.  Has been on atripla since dx. Also taking herbal supplements.  CD4 1210  VL <20, Chol 284 (03-16-11).  Wt is up, exercising "a little bit".   Review of Systems  Constitutional: Negative for appetite change and unexpected weight change.  HENT: Positive for postnasal drip and sinus pressure.   Respiratory: Negative for chest tightness and shortness of breath.   Gastrointestinal: Negative for diarrhea and constipation.  Genitourinary: Negative for difficulty urinating.       Objective:   Physical Exam  Constitutional: She appears well-developed and well-nourished.  Eyes: EOM are normal. Pupils are equal, round, and reactive to light.  Neck: Neck supple.  Cardiovascular: Normal rate, regular rhythm and normal heart sounds.   Pulmonary/Chest: Effort normal and breath sounds normal.  Abdominal: Soft. Bowel sounds are normal. She exhibits no distension.          Assessment & Plan:

## 2011-03-29 NOTE — Assessment & Plan Note (Signed)
Well controlled with bananas and prevacid.

## 2011-03-29 NOTE — Assessment & Plan Note (Signed)
Encouraged her to diet and exercise. She is going to restart water aerobics.

## 2011-03-29 NOTE — Assessment & Plan Note (Signed)
Well controlled 

## 2011-03-29 NOTE — Assessment & Plan Note (Signed)
She is doing very well. Offered condoms. She is not sexually active. Does not smoke or drink. Will see her back in 4-5 months , labs prior.

## 2011-05-09 ENCOUNTER — Other Ambulatory Visit: Payer: Self-pay | Admitting: *Deleted

## 2011-05-09 DIAGNOSIS — F419 Anxiety disorder, unspecified: Secondary | ICD-10-CM

## 2011-05-09 MED ORDER — LORAZEPAM 1 MG PO TABS: 1.0000 mg | ORAL_TABLET | Freq: Three times a day (TID) | ORAL | Status: DC | PRN

## 2011-08-21 LAB — T-HELPER CELL (CD4) - (RCID CLINIC ONLY)
CD4 % Helper T Cell: 36
CD4 T Cell Abs: 710

## 2011-09-08 ENCOUNTER — Other Ambulatory Visit (INDEPENDENT_AMBULATORY_CARE_PROVIDER_SITE_OTHER): Payer: Medicare PPO

## 2011-09-08 ENCOUNTER — Ambulatory Visit (INDEPENDENT_AMBULATORY_CARE_PROVIDER_SITE_OTHER): Payer: Medicare PPO | Admitting: *Deleted

## 2011-09-08 DIAGNOSIS — B20 Human immunodeficiency virus [HIV] disease: Secondary | ICD-10-CM

## 2011-09-08 DIAGNOSIS — Z23 Encounter for immunization: Secondary | ICD-10-CM

## 2011-09-08 LAB — T-HELPER CELL (CD4) - (RCID CLINIC ONLY)
CD4 % Helper T Cell: 42 % (ref 33–55)
CD4 % Helper T Cell: 33
CD4 T Cell Abs: 770

## 2011-09-08 LAB — CBC WITH DIFFERENTIAL/PLATELET
Eosinophils Relative: 4 % (ref 0–5)
HCT: 38 % (ref 36.0–46.0)
Lymphocytes Relative: 50 % — ABNORMAL HIGH (ref 12–46)
Lymphs Abs: 3.3 10*3/uL (ref 0.7–4.0)
MCV: 94.3 fL (ref 78.0–100.0)
Monocytes Absolute: 0.4 10*3/uL (ref 0.1–1.0)
Neutro Abs: 2.7 10*3/uL (ref 1.7–7.7)
Platelets: 324 10*3/uL (ref 150–400)
RBC: 4.03 MIL/uL (ref 3.87–5.11)
WBC: 6.6 10*3/uL (ref 4.0–10.5)

## 2011-09-08 LAB — COMPREHENSIVE METABOLIC PANEL
Albumin: 4.2 g/dL (ref 3.5–5.2)
Alkaline Phosphatase: 78 U/L (ref 39–117)
BUN: 18 mg/dL (ref 6–23)
Creat: 0.71 mg/dL (ref 0.50–1.10)
Glucose, Bld: 79 mg/dL (ref 70–99)
Potassium: 4.6 mEq/L (ref 3.5–5.3)
Total Bilirubin: 0.1 mg/dL — ABNORMAL LOW (ref 0.3–1.2)

## 2011-09-11 LAB — HIV-1 RNA QUANT-NO REFLEX-BLD
HIV 1 RNA Quant: <20 copies/mL (ref <20)
HIV-1 RNA Quant, Log: <1.3 {Log} (ref <1.30)

## 2011-09-22 ENCOUNTER — Ambulatory Visit: Payer: Medicare PPO

## 2011-09-22 ENCOUNTER — Other Ambulatory Visit: Payer: Self-pay | Admitting: *Deleted

## 2011-09-22 ENCOUNTER — Other Ambulatory Visit (HOSPITAL_COMMUNITY)
Admission: RE | Admit: 2011-09-22 | Discharge: 2011-09-22 | Disposition: A | Discharge status: Home / Self Care | Payer: Medicare PPO | Source: Ambulatory Visit | Attending: Infectious Diseases | Admitting: Infectious Diseases

## 2011-09-22 ENCOUNTER — Other Ambulatory Visit: Payer: Medicare PPO | Admitting: Infectious Diseases

## 2011-09-22 ENCOUNTER — Other Ambulatory Visit: Payer: Self-pay | Admitting: Infectious Diseases

## 2011-09-22 ENCOUNTER — Other Ambulatory Visit: Payer: Medicare PPO

## 2011-09-22 ENCOUNTER — Ambulatory Visit (INDEPENDENT_AMBULATORY_CARE_PROVIDER_SITE_OTHER): Payer: Medicare PPO | Admitting: Infectious Diseases

## 2011-09-22 ENCOUNTER — Encounter: Payer: Self-pay | Admitting: Infectious Diseases

## 2011-09-22 DIAGNOSIS — Z79899 Other long term (current) drug therapy: Secondary | ICD-10-CM

## 2011-09-22 DIAGNOSIS — B20 Human immunodeficiency virus [HIV] disease: Secondary | ICD-10-CM

## 2011-09-22 DIAGNOSIS — Z124 Encounter for screening for malignant neoplasm of cervix: Secondary | ICD-10-CM

## 2011-09-22 DIAGNOSIS — Z113 Encounter for screening for infections with a predominantly sexual mode of transmission: Secondary | ICD-10-CM

## 2011-09-22 DIAGNOSIS — F419 Anxiety disorder, unspecified: Secondary | ICD-10-CM

## 2011-09-22 DIAGNOSIS — Z789 Other specified health status: Secondary | ICD-10-CM

## 2011-09-22 MED ORDER — CROMOLYN SODIUM 4 % OP SOLN: 1.0000 [drp] | Freq: Four times a day (QID) | OPHTHALMIC | Status: DC | PRN

## 2011-09-22 MED ORDER — TRIAMCINOLONE ACETONIDE 0.1 % EX CREA: TOPICAL_CREAM | Freq: Two times a day (BID) | CUTANEOUS | Status: DC

## 2011-09-22 MED ORDER — LORAZEPAM 1 MG PO TABS: 1.0000 mg | ORAL_TABLET | Freq: Three times a day (TID) | ORAL | Status: DC | PRN

## 2011-09-22 NOTE — Progress Notes (Signed)
  Subjective:     Sheri Davis is a 50 y.o. woman who comes in today for a  pap smear.    Review of Systems   Objective:    There were no vitals taken for this visit. Pelvic Exam: . Pap smear obtained.  No menses for 5 years.  Assessment:    Screening pap smear.   Plan:    Follow up in one year, or as indicated by Pap results.  Subjective:     Sheri Davis is a 50 y.o. woman who comes in today for a  pap smear only.   Review of Systems    Objective:    There were no vitals taken for this visit. Pelvic Exam: . Pap smear obtained.   Assessment:    Screening pap smear.   Plan:    Follow up in one year, or as indicated by Pap results.

## 2011-09-22 NOTE — Assessment & Plan Note (Signed)
Will continue on prevacid.

## 2011-09-22 NOTE — Assessment & Plan Note (Signed)
BP well controlled today. Will continue to monitor.

## 2011-09-22 NOTE — Assessment & Plan Note (Signed)
She is doing very well. Will continue her current rx. Her PNVX and flu are uptodate. Offered condoms, not sexually active. Will see her back in 5-6 months with labs prior.

## 2011-09-22 NOTE — Progress Notes (Signed)
  Subjective:    Patient ID: Sheri Davis, female    DOB: [birth year], 50 y.o.   MRN: [id number]  HPI 50 yo F with HIV+ 2007.  Has been on atripla since dx. Also taking herbal supplements.  CD4 1380 VL <20 (09-08-11), Chol 284 (03-16-11). Has been feeling well, no problems with medicine.     Review of Systems  Constitutional: Negative for appetite change and unexpected weight change.  Gastrointestinal: Negative for nausea and diarrhea.  Genitourinary: Negative for dysuria.       Ammenorrhea, has hot flashes.        Objective:   Physical Exam  Constitutional: She appears well-developed and well-nourished.  Eyes: EOM are normal. Pupils are equal, round, and reactive to light.  Neck: Neck supple.  Cardiovascular: Normal rate, regular rhythm and normal heart sounds.   Pulmonary/Chest: Effort normal and breath sounds normal.  Abdominal: Soft. Bowel sounds are normal. There is no tenderness.  Lymphadenopathy:    She has no cervical adenopathy.          Assessment & Plan:

## 2011-09-22 NOTE — Assessment & Plan Note (Signed)
Continues to use prn ativan, refilled

## 2011-09-22 NOTE — Patient Instructions (Signed)
  Pt given educational materials re:  HIV and women, BSE, nutrition, diet, exercise, self-esteem and safe sex.  Pt given condoms.

## 2011-09-25 ENCOUNTER — Other Ambulatory Visit: Payer: Self-pay | Admitting: Infectious Diseases

## 2011-09-25 DIAGNOSIS — Z1231 Encounter for screening mammogram for malignant neoplasm of breast: Secondary | ICD-10-CM

## 2011-09-29 ENCOUNTER — Ambulatory Visit: Payer: Medicare PPO

## 2011-09-29 ENCOUNTER — Encounter: Payer: Self-pay | Admitting: *Deleted

## 2011-10-16 ENCOUNTER — Other Ambulatory Visit: Payer: Self-pay | Admitting: *Deleted

## 2011-10-16 DIAGNOSIS — K219 Gastro-esophageal reflux disease without esophagitis: Secondary | ICD-10-CM

## 2011-10-16 MED ORDER — LANSOPRAZOLE 30 MG PO CPDR: 30.0000 mg | DELAYED_RELEASE_CAPSULE | Freq: Two times a day (BID) | ORAL | Status: DC

## 2011-10-17 ENCOUNTER — Other Ambulatory Visit: Payer: Self-pay | Admitting: *Deleted

## 2011-10-17 DIAGNOSIS — K219 Gastro-esophageal reflux disease without esophagitis: Secondary | ICD-10-CM

## 2011-10-17 MED ORDER — LANSOPRAZOLE 30 MG PO CPDR: 30.0000 mg | DELAYED_RELEASE_CAPSULE | Freq: Two times a day (BID) | ORAL | Status: DC

## 2011-10-23 ENCOUNTER — Other Ambulatory Visit: Payer: Self-pay | Admitting: *Deleted

## 2011-10-23 DIAGNOSIS — J45909 Unspecified asthma, uncomplicated: Secondary | ICD-10-CM

## 2011-10-23 MED ORDER — FLUTICASONE PROPIONATE HFA 110 MCG/ACT IN AERO: 1.0000 | INHALATION_SPRAY | Freq: Every day | RESPIRATORY_TRACT | Status: DC

## 2011-11-07 ENCOUNTER — Other Ambulatory Visit: Payer: Self-pay | Admitting: Infectious Diseases

## 2011-11-07 DIAGNOSIS — Z1231 Encounter for screening mammogram for malignant neoplasm of breast: Secondary | ICD-10-CM

## 2011-11-30 ENCOUNTER — Ambulatory Visit
Admission: RE | Admit: 2011-11-30 | Discharge: 2011-11-30 | Disposition: A | Discharge status: Home / Self Care | Payer: Medicare PPO | Source: Ambulatory Visit | Attending: Infectious Diseases | Admitting: Infectious Diseases

## 2011-11-30 DIAGNOSIS — Z1231 Encounter for screening mammogram for malignant neoplasm of breast: Secondary | ICD-10-CM

## 2011-12-04 ENCOUNTER — Ambulatory Visit (HOSPITAL_COMMUNITY): Payer: Medicare PPO

## 2012-01-10 ENCOUNTER — Other Ambulatory Visit: Payer: Medicare PPO

## 2012-01-10 DIAGNOSIS — Z113 Encounter for screening for infections with a predominantly sexual mode of transmission: Secondary | ICD-10-CM

## 2012-01-10 DIAGNOSIS — Z79899 Other long term (current) drug therapy: Secondary | ICD-10-CM

## 2012-01-10 DIAGNOSIS — B20 Human immunodeficiency virus [HIV] disease: Secondary | ICD-10-CM

## 2012-01-10 LAB — CBC
HCT: 39.3 % (ref 36.0–46.0)
Hemoglobin: 13.1 g/dL (ref 12.0–15.0)
WBC: 6.6 10*3/uL (ref 4.0–10.5)

## 2012-01-10 LAB — LIPID PANEL
Cholesterol: 276 mg/dL — ABNORMAL HIGH (ref 0–200)
HDL: 69 mg/dL (ref >39)
Triglycerides: 164 mg/dL — ABNORMAL HIGH (ref <150)

## 2012-01-10 LAB — COMPREHENSIVE METABOLIC PANEL
Albumin: 4.1 g/dL (ref 3.5–5.2)
BUN: 17 mg/dL (ref 6–23)
Calcium: 9.4 mg/dL (ref 8.4–10.5)
Chloride: 106 mEq/L (ref 96–112)
Glucose, Bld: 92 mg/dL (ref 70–99)
Potassium: 4.9 mEq/L (ref 3.5–5.3)

## 2012-01-11 LAB — T-HELPER CELL (CD4) - (RCID CLINIC ONLY): CD4 % Helper T Cell: 41 % (ref 33–55)

## 2012-01-12 ENCOUNTER — Other Ambulatory Visit: Payer: Self-pay | Admitting: *Deleted

## 2012-01-12 DIAGNOSIS — B20 Human immunodeficiency virus [HIV] disease: Secondary | ICD-10-CM

## 2012-01-12 LAB — HIV-1 RNA QUANT-NO REFLEX-BLD: HIV-1 RNA Quant, Log: <1.3 {Log} (ref <1.30)

## 2012-01-12 MED ORDER — EFAVIRENZ-EMTRICITAB-TENOFOVIR 600-200-300 MG PO TABS: 1.0000 | ORAL_TABLET | Freq: Every day | ORAL | Status: DC

## 2012-01-15 ENCOUNTER — Other Ambulatory Visit: Payer: Medicare PPO

## 2012-01-29 ENCOUNTER — Other Ambulatory Visit: Payer: Self-pay | Admitting: *Deleted

## 2012-01-29 ENCOUNTER — Ambulatory Visit (INDEPENDENT_AMBULATORY_CARE_PROVIDER_SITE_OTHER): Payer: Medicare PPO | Admitting: Infectious Diseases

## 2012-01-29 ENCOUNTER — Encounter: Payer: Self-pay | Admitting: Infectious Diseases

## 2012-01-29 VITALS — BP 135/86 | HR 67 | Temp 97.8°F | Ht 65.0 in | Wt 222.5 lb

## 2012-01-29 DIAGNOSIS — B20 Human immunodeficiency virus [HIV] disease: Secondary | ICD-10-CM

## 2012-01-29 DIAGNOSIS — E785 Hyperlipidemia, unspecified: Secondary | ICD-10-CM

## 2012-01-29 DIAGNOSIS — Z789 Other specified health status: Secondary | ICD-10-CM

## 2012-01-29 DIAGNOSIS — F419 Anxiety disorder, unspecified: Secondary | ICD-10-CM

## 2012-01-29 DIAGNOSIS — Z23 Encounter for immunization: Secondary | ICD-10-CM

## 2012-01-29 DIAGNOSIS — J45909 Unspecified asthma, uncomplicated: Secondary | ICD-10-CM

## 2012-01-29 DIAGNOSIS — K219 Gastro-esophageal reflux disease without esophagitis: Secondary | ICD-10-CM

## 2012-01-29 MED ORDER — ATENOLOL 25 MG PO TABS: 25.0000 mg | ORAL_TABLET | Freq: Every day | ORAL | Status: DC

## 2012-01-29 MED ORDER — LORAZEPAM 1 MG PO TABS: 1.0000 mg | ORAL_TABLET | Freq: Three times a day (TID) | ORAL | Status: DC | PRN

## 2012-01-29 MED ORDER — PRAVASTATIN SODIUM 20 MG PO TABS: 20.0000 mg | ORAL_TABLET | Freq: Every evening | ORAL | Status: DC

## 2012-01-29 MED ORDER — CROMOLYN SODIUM 4 % OP SOLN: 1.0000 [drp] | Freq: Four times a day (QID) | OPHTHALMIC | Status: DC | PRN

## 2012-01-29 MED ORDER — EFAVIRENZ-EMTRICITAB-TENOFOVIR 600-200-300 MG PO TABS: 1.0000 | ORAL_TABLET | Freq: Every day | ORAL | Status: DC

## 2012-01-29 MED ORDER — FLUTICASONE PROPIONATE HFA 110 MCG/ACT IN AERO: 1.0000 | INHALATION_SPRAY | Freq: Every day | RESPIRATORY_TRACT | Status: DC

## 2012-01-29 MED ORDER — TRIAMCINOLONE ACETONIDE 0.1 % EX CREA: TOPICAL_CREAM | Freq: Two times a day (BID) | CUTANEOUS | Status: DC

## 2012-01-29 MED ORDER — LANSOPRAZOLE 30 MG PO CPDR: 30.0000 mg | DELAYED_RELEASE_CAPSULE | Freq: Two times a day (BID) | ORAL | Status: DC

## 2012-01-29 MED ORDER — ALBUTEROL SULFATE HFA 108 (90 BASE) MCG/ACT IN AERS: 1.0000 | INHALATION_SPRAY | RESPIRATORY_TRACT | Status: DC | PRN

## 2012-01-29 NOTE — Progress Notes (Signed)
  Subjective:    Patient ID: Sheri Davis, female    DOB: [birth year], 51 y.o.   MRN: [id number]  HPI 51 yo F with HIV+ 2007.  Has been on atripla since dx. Also taking herbal supplements.  CD4 1400 VL <20, Chol 276  (01-10-12). PAP normal (09-22-11), Mammo (11-30-11).  Has lost 14#, has cut back eating (portion control, less fried food). Would like to loose more. Has quit taking prozac and feels better. Still uses prn ativan.      Review of Systems  Constitutional: Negative for appetite change and unexpected weight change.  Gastrointestinal: Negative for diarrhea and constipation.  Genitourinary: Negative for dysuria.  Neurological: Negative for headaches.       Objective:   Physical Exam  Constitutional: She appears well-developed and well-nourished.  Eyes: EOM are normal. Pupils are equal, round, and reactive to light.  Neck: Neck supple.  Cardiovascular: Normal rate, regular rhythm and normal heart sounds.   Pulmonary/Chest: Effort normal and breath sounds normal.  Abdominal: Soft. Bowel sounds are normal. She exhibits no distension. There is no tenderness.  Lymphadenopathy:    She has no cervical adenopathy.          Assessment & Plan:

## 2012-01-29 NOTE — Progress Notes (Signed)
Addended by: Wendall Mola A on: 01/29/2012 12:41 PM   Modules accepted: Orders

## 2012-01-29 NOTE — Assessment & Plan Note (Signed)
Offer to start her on statin.

## 2012-01-29 NOTE — Assessment & Plan Note (Signed)
She is doing very well, offered condoms. Refuses. She wishes to get colonoscopy, wants to get PNVX shot early.  rtc 6 months.

## 2012-02-06 ENCOUNTER — Other Ambulatory Visit: Payer: Self-pay | Admitting: *Deleted

## 2012-02-06 DIAGNOSIS — E785 Hyperlipidemia, unspecified: Secondary | ICD-10-CM

## 2012-02-06 MED ORDER — ATENOLOL 25 MG PO TABS: 25.0000 mg | ORAL_TABLET | Freq: Every day | ORAL | Status: DC

## 2012-03-11 ENCOUNTER — Other Ambulatory Visit: Payer: Self-pay | Admitting: Licensed Clinical Social Worker

## 2012-03-11 ENCOUNTER — Telehealth: Payer: Self-pay | Admitting: *Deleted

## 2012-03-11 DIAGNOSIS — Z889 Allergy status to unspecified drugs, medicaments and biological substances status: Secondary | ICD-10-CM

## 2012-03-11 DIAGNOSIS — J302 Other seasonal allergic rhinitis: Secondary | ICD-10-CM

## 2012-03-11 MED ORDER — CROMOLYN SODIUM 5.2 MG/ACT NA AERS: 1.0000 | INHALATION_SPRAY | Freq: Three times a day (TID) | NASAL | Status: DC

## 2012-03-11 MED ORDER — FLUTICASONE PROPIONATE 50 MCG/ACT NA SUSP: 1.0000 | Freq: Every day | NASAL | Status: DC

## 2012-03-11 NOTE — Telephone Encounter (Signed)
Pt has been using old rx for "NasalChrom" for seasonal allergies.  This rx does not appear in EPIC or Cent[facility].  Pt requesting prescription for NasalChrom not NasalCort.  MD please advise.

## 2012-03-11 NOTE — Telephone Encounter (Signed)
Verbal order from Dr. Johny Sax for NasalCrom received.  Pt notified.

## 2012-03-13 ENCOUNTER — Encounter: Payer: Self-pay | Admitting: *Deleted

## 2012-03-13 NOTE — Progress Notes (Signed)
Patient ID: Sheri Davis, female   DOB: 1961/02/08, 51 y.o.   MRN: [id number]  Pt has appointment with Dr. Laural Benes at [facility] @ [address] on Monday, Apr 08, 2012 @ 11:00 am. Have sent letter to patient and left message as reminder of appointment. Tacey Heap RN

## 2012-03-25 ENCOUNTER — Other Ambulatory Visit: Payer: Self-pay | Admitting: Licensed Clinical Social Worker

## 2012-03-25 DIAGNOSIS — Z889 Allergy status to unspecified drugs, medicaments and biological substances status: Secondary | ICD-10-CM

## 2012-03-25 DIAGNOSIS — K219 Gastro-esophageal reflux disease without esophagitis: Secondary | ICD-10-CM

## 2012-03-25 DIAGNOSIS — E785 Hyperlipidemia, unspecified: Secondary | ICD-10-CM

## 2012-03-25 MED ORDER — PRAVASTATIN SODIUM 20 MG PO TABS: 20.0000 mg | ORAL_TABLET | Freq: Every evening | ORAL | Status: DC

## 2012-03-25 MED ORDER — FLUTICASONE PROPIONATE 50 MCG/ACT NA SUSP: 1.0000 | Freq: Every day | NASAL | Status: DC

## 2012-03-25 MED ORDER — LANSOPRAZOLE 30 MG PO CPDR: 30.0000 mg | DELAYED_RELEASE_CAPSULE | Freq: Two times a day (BID) | ORAL | Status: DC

## 2012-05-02 NOTE — Progress Notes (Signed)
Patient ID: Sheri Davis, female   DOB: December 11, 1960, 51 y.o.   MRN: [id number]  Pt no showed for this appt. Tacey Heap RN

## 2012-07-22 ENCOUNTER — Other Ambulatory Visit: Payer: Self-pay | Admitting: Infectious Diseases

## 2012-08-13 ENCOUNTER — Other Ambulatory Visit: Payer: Self-pay | Admitting: Infectious Diseases

## 2012-08-13 DIAGNOSIS — Z113 Encounter for screening for infections with a predominantly sexual mode of transmission: Secondary | ICD-10-CM

## 2012-08-14 ENCOUNTER — Ambulatory Visit (INDEPENDENT_AMBULATORY_CARE_PROVIDER_SITE_OTHER): Payer: Medicare PPO | Admitting: *Deleted

## 2012-08-14 ENCOUNTER — Other Ambulatory Visit: Payer: Medicare PPO

## 2012-08-14 ENCOUNTER — Other Ambulatory Visit (HOSPITAL_COMMUNITY)
Admission: RE | Admit: 2012-08-14 | Discharge: 2012-08-14 | Disposition: A | Discharge status: Home / Self Care | Payer: Medicare PPO | Source: Ambulatory Visit | Attending: Infectious Disease | Admitting: Infectious Disease

## 2012-08-14 ENCOUNTER — Other Ambulatory Visit (HOSPITAL_COMMUNITY)
Admission: RE | Admit: 2012-08-14 | Discharge: 2012-08-14 | Disposition: A | Discharge status: Home / Self Care | Payer: Medicare PPO | Source: Ambulatory Visit | Attending: Infectious Diseases | Admitting: Infectious Diseases

## 2012-08-14 DIAGNOSIS — Z23 Encounter for immunization: Secondary | ICD-10-CM

## 2012-08-14 DIAGNOSIS — Z124 Encounter for screening for malignant neoplasm of cervix: Secondary | ICD-10-CM | POA: Insufficient documentation

## 2012-08-14 DIAGNOSIS — Z113 Encounter for screening for infections with a predominantly sexual mode of transmission: Secondary | ICD-10-CM | POA: Insufficient documentation

## 2012-08-14 DIAGNOSIS — Z0000 Encounter for general adult medical examination without abnormal findings: Secondary | ICD-10-CM

## 2012-08-14 DIAGNOSIS — B20 Human immunodeficiency virus [HIV] disease: Secondary | ICD-10-CM

## 2012-08-14 LAB — COMPREHENSIVE METABOLIC PANEL
ALT: 25 U/L (ref 0–35)
AST: 22 U/L (ref 0–37)
CO2: 25 mEq/L (ref 19–32)
Chloride: 105 mEq/L (ref 96–112)
Sodium: 140 mEq/L (ref 135–145)
Total Bilirubin: 0.3 mg/dL (ref 0.3–1.2)
Total Protein: 7.3 g/dL (ref 6.0–8.3)

## 2012-08-14 LAB — CBC
MCH: 31.4 pg (ref 26.0–34.0)
Platelets: 348 10*3/uL (ref 150–400)
RBC: 4.3 MIL/uL (ref 3.87–5.11)
RDW: 13.3 % (ref 11.5–15.5)
WBC: 6.4 10*3/uL (ref 4.0–10.5)

## 2012-08-14 LAB — LIPID PANEL
LDL Cholesterol: 198 mg/dL — ABNORMAL HIGH (ref 0–99)
VLDL: 31 mg/dL (ref 0–40)

## 2012-08-14 NOTE — Progress Notes (Signed)
  Subjective:     Sheri Davis is a 51 y.o. woman who comes in today for a  pap smear only. Contraception: condoms  Objective:    There were no vitals taken for this visit. Pelvic Exam:Pap smear obtained.   Assessment:    Screening pap smear.   Plan:    Follow up in one year, or as indicated by Pap results.  Pt given condoms. Pt given educational materials re: HIV and women, heart disease, nutrition, diet, exercise, PAP smears, self-esteem, and partner protection. BSE card given.

## 2012-08-14 NOTE — Patient Instructions (Addendum)
Thank you for coming to the Center for your care.  Your results will be ready in about a week.  Dr. Ninetta Lights will share them with you at your visit on 09/02/12.  Angelique Blonder, RN

## 2012-08-15 LAB — HIV-1 RNA QUANT-NO REFLEX-BLD: HIV 1 RNA Quant: <20 copies/mL (ref <20)

## 2012-08-16 ENCOUNTER — Ambulatory Visit: Payer: Medicare PPO

## 2012-08-23 ENCOUNTER — Ambulatory Visit: Payer: Medicare PPO

## 2012-08-28 ENCOUNTER — Ambulatory Visit: Payer: Medicare PPO | Admitting: Infectious Diseases

## 2012-09-02 ENCOUNTER — Ambulatory Visit (INDEPENDENT_AMBULATORY_CARE_PROVIDER_SITE_OTHER): Payer: Medicare PPO | Admitting: Infectious Diseases

## 2012-09-02 ENCOUNTER — Other Ambulatory Visit: Payer: Self-pay | Admitting: Licensed Clinical Social Worker

## 2012-09-02 ENCOUNTER — Encounter: Payer: Self-pay | Admitting: Infectious Diseases

## 2012-09-02 VITALS — BP 170/104 | HR 85 | Temp 97.6°F | Ht 65.0 in | Wt 222.0 lb

## 2012-09-02 DIAGNOSIS — F419 Anxiety disorder, unspecified: Secondary | ICD-10-CM

## 2012-09-02 DIAGNOSIS — K219 Gastro-esophageal reflux disease without esophagitis: Secondary | ICD-10-CM

## 2012-09-02 DIAGNOSIS — F329 Major depressive disorder, single episode, unspecified: Secondary | ICD-10-CM

## 2012-09-02 DIAGNOSIS — Z531 Procedure and treatment not carried out because of patient's decision for reasons of belief and group pressure: Secondary | ICD-10-CM

## 2012-09-02 DIAGNOSIS — J45909 Unspecified asthma, uncomplicated: Secondary | ICD-10-CM

## 2012-09-02 DIAGNOSIS — E785 Hyperlipidemia, unspecified: Secondary | ICD-10-CM

## 2012-09-02 DIAGNOSIS — Z789 Other specified health status: Secondary | ICD-10-CM

## 2012-09-02 DIAGNOSIS — B20 Human immunodeficiency virus [HIV] disease: Secondary | ICD-10-CM

## 2012-09-02 DIAGNOSIS — I10 Essential (primary) hypertension: Secondary | ICD-10-CM

## 2012-09-02 DIAGNOSIS — Z889 Allergy status to unspecified drugs, medicaments and biological substances status: Secondary | ICD-10-CM

## 2012-09-02 MED ORDER — CROMOLYN SODIUM 4 % OP SOLN: 1.0000 [drp] | Freq: Four times a day (QID) | OPHTHALMIC | Status: DC | PRN

## 2012-09-02 MED ORDER — EFAVIRENZ-EMTRICITAB-TENOFOVIR 600-200-300 MG PO TABS: 1.0000 | ORAL_TABLET | Freq: Every day | ORAL | Status: DC

## 2012-09-02 MED ORDER — ATENOLOL 25 MG PO TABS: 25.0000 mg | ORAL_TABLET | Freq: Every day | ORAL | Status: DC

## 2012-09-02 MED ORDER — TRIAMCINOLONE ACETONIDE 0.1 % EX CREA: TOPICAL_CREAM | Freq: Two times a day (BID) | CUTANEOUS | Status: DC

## 2012-09-02 MED ORDER — FLUTICASONE PROPIONATE HFA 110 MCG/ACT IN AERO: 1.0000 | INHALATION_SPRAY | Freq: Every day | RESPIRATORY_TRACT | Status: DC

## 2012-09-02 MED ORDER — ALBUTEROL SULFATE HFA 108 (90 BASE) MCG/ACT IN AERS: 1.0000 | INHALATION_SPRAY | RESPIRATORY_TRACT | Status: DC | PRN

## 2012-09-02 MED ORDER — LANSOPRAZOLE 30 MG PO CPDR: 30.0000 mg | DELAYED_RELEASE_CAPSULE | Freq: Two times a day (BID) | ORAL | Status: DC

## 2012-09-02 MED ORDER — LORAZEPAM 1 MG PO TABS: 1.0000 mg | ORAL_TABLET | Freq: Three times a day (TID) | ORAL | Status: DC | PRN

## 2012-09-02 MED ORDER — FLUTICASONE PROPIONATE 50 MCG/ACT NA SUSP: 1.0000 | Freq: Every day | NASAL | Status: DC

## 2012-09-02 NOTE — Assessment & Plan Note (Signed)
She re-iterates this today.

## 2012-09-02 NOTE — Assessment & Plan Note (Signed)
She is doing very well. Will cont her current art. She gets flu shot today. She is offered/refuses condoms. I agreed to be her PCP. Will see her back in 3-4 months.

## 2012-09-02 NOTE — Assessment & Plan Note (Signed)
She has quit taking her pravastatin as she is afraid it will give her DM. Will try to get her to restart in future.

## 2012-09-02 NOTE — Assessment & Plan Note (Signed)
She becomes easily tearful today with sadness that her mother has passed (she was her sole support for her HIV and she feels isolated). I offered to refer her for counseling. She declines.

## 2012-09-02 NOTE — Progress Notes (Signed)
  Subjective:    Patient ID: Sheri Davis, female    DOB: Jun 12, 1961, 51 y.o.   MRN: [id number]  HPI 51 yo F with HIV+ 2007.  Has been on atripla since dx. Also taking herbal supplements Did not take her anti-htn rx this am. Thinks her BP may be up due to rushing to get here today.  Having issues with scratchy throat due to weather.   HIV 1 RNA Quant (copies/mL)  Date Value  08/14/2012 <20   01/10/2012 <20   09/08/2011 <20      CD4 T Cell Abs (cmm)  Date Value  08/14/2012 1160   01/10/2012 1400   09/08/2011 1380    Lab Results  Component Value Date   CHOL 302* 08/14/2012   HDL 73 08/14/2012   LDLCALC 198* 08/14/2012   TRIG 157* 08/14/2012   CHOLHDL 4.1 08/14/2012      Would like me to her PCP stressful time of year for her- 7 years ago her mom died, she found out she had HIV at same time, 12 years ago her niece died.  Has been taking ibuprofen for muscle/shoulder pain.  Has not been taking pravastatin.  Hasn't been getting good rest.  Review of Systems  Respiratory: Negative for shortness of breath.   Cardiovascular: Negative for chest pain and leg swelling.  Neurological: Negative for headaches.  Psychiatric/Behavioral: Positive for dysphoric mood.       Objective:   Physical Exam  Constitutional: She appears well-developed and well-nourished.  HENT:  Mouth/Throat: No oropharyngeal exudate.  Eyes: EOM are normal. Pupils are equal, round, and reactive to light.  Neck: Neck supple.  Cardiovascular: Normal rate, regular rhythm and normal heart sounds.   Pulmonary/Chest: Effort normal and breath sounds normal.  Abdominal: Soft. Bowel sounds are normal. There is no tenderness.  Lymphadenopathy:    She has no cervical adenopathy.          Assessment & Plan:

## 2012-09-02 NOTE — Assessment & Plan Note (Signed)
She is asx, is aware she needs to take her rx. She will restart. Will see her back in 3-4 months for BP f/u.

## 2012-10-01 ENCOUNTER — Encounter: Payer: Self-pay | Admitting: Infectious Diseases

## 2012-10-01 ENCOUNTER — Ambulatory Visit (INDEPENDENT_AMBULATORY_CARE_PROVIDER_SITE_OTHER): Payer: Medicare PPO | Admitting: Infectious Diseases

## 2012-10-01 ENCOUNTER — Telehealth: Payer: Self-pay | Admitting: *Deleted

## 2012-10-01 VITALS — BP 131/86 | HR 77 | Temp 98.5°F | Wt 222.0 lb

## 2012-10-01 DIAGNOSIS — I10 Essential (primary) hypertension: Secondary | ICD-10-CM

## 2012-10-01 DIAGNOSIS — E785 Hyperlipidemia, unspecified: Secondary | ICD-10-CM

## 2012-10-01 DIAGNOSIS — B20 Human immunodeficiency virus [HIV] disease: Secondary | ICD-10-CM

## 2012-10-01 MED ORDER — PRAVASTATIN SODIUM 20 MG PO TABS: 20.0000 mg | ORAL_TABLET | Freq: Every day | ORAL | Status: DC

## 2012-10-01 NOTE — Assessment & Plan Note (Signed)
Well controlled. No change in meds, she believes that her BP is being controlled by her herbals.

## 2012-10-01 NOTE — Assessment & Plan Note (Signed)
She is doing very well. Will continue her current ART. She is offered/refused condoms. Will see her back in January with labs, as scheduled.

## 2012-10-01 NOTE — Telephone Encounter (Signed)
Patient called and advised she has an abcess in her mouth causing her lots of pain and neausea. She is on the waiting list for dental and did speak to them this morning. They are trying to get her in this month but she is in pain and wants to be seen. Offered her an appt with Dr Ninetta Lights for 10/01/12 at 315 and she accepted the appt.

## 2012-10-01 NOTE — Assessment & Plan Note (Signed)
Will restart her statin as she now agrees to take.

## 2012-10-01 NOTE — Progress Notes (Signed)
  Subjective:    Patient ID: Sheri Davis, female    DOB: May 20, 1961, 51 y.o.   MRN: [id number]  HPI 51 yo F with HIV+ 2007, HTN, hyperlipidemia, depression.   Has been on atripla since dx. Also taking herbal supplements (hawthorne berry, garlic, horsetail, mytaki/shitaki mushroom, astragulis root).   Has been taking atenolol.  HIV 1 RNA Quant (copies/mL)  Date Value  08/14/2012 <20   01/10/2012 <20   09/08/2011 <20      CD4 T Cell Abs (cmm)  Date Value  08/14/2012 1160   01/10/2012 1400   09/08/2011 1380     Has area in the roof of her mouth that she is worried is an abscess. Does not hurt.  Not problems with atripla. Not sexually active.  Mood has been fine. Had PAP, will have mammo in January.    Review of Systems     Objective:   Physical Exam  Constitutional: She appears well-developed and well-nourished.  HENT:  Mouth/Throat: No oropharyngeal exudate.    Eyes: EOM are normal. Pupils are equal, round, and reactive to light.  Cardiovascular: Normal rate, regular rhythm and normal heart sounds.   Pulmonary/Chest: Effort normal and breath sounds normal.  Abdominal: Soft. Bowel sounds are normal. She exhibits no distension. There is no tenderness.          Assessment & Plan:

## 2012-12-04 ENCOUNTER — Other Ambulatory Visit (HOSPITAL_COMMUNITY)
Admission: RE | Admit: 2012-12-04 | Discharge: 2012-12-04 | Disposition: A | Discharge status: Home / Self Care | Payer: Medicare PPO | Source: Ambulatory Visit | Attending: Infectious Diseases | Admitting: Infectious Diseases

## 2012-12-04 ENCOUNTER — Other Ambulatory Visit (INDEPENDENT_AMBULATORY_CARE_PROVIDER_SITE_OTHER): Payer: Medicare PPO

## 2012-12-04 DIAGNOSIS — Z79899 Other long term (current) drug therapy: Secondary | ICD-10-CM

## 2012-12-04 DIAGNOSIS — Z113 Encounter for screening for infections with a predominantly sexual mode of transmission: Secondary | ICD-10-CM | POA: Insufficient documentation

## 2012-12-04 DIAGNOSIS — R82998 Other abnormal findings in urine: Secondary | ICD-10-CM

## 2012-12-04 DIAGNOSIS — B20 Human immunodeficiency virus [HIV] disease: Secondary | ICD-10-CM

## 2012-12-04 LAB — CBC WITH DIFFERENTIAL/PLATELET
Basophils Absolute: 0 10*3/uL (ref 0.0–0.1)
Basophils Relative: 0 % (ref 0–1)
Eosinophils Relative: 4 % (ref 0–5)
HCT: 38 % (ref 36.0–46.0)
Lymphocytes Relative: 36 % (ref 12–46)
MCHC: 35.5 g/dL (ref 30.0–36.0)
Monocytes Absolute: 0.3 10*3/uL (ref 0.1–1.0)
Neutro Abs: 3.6 10*3/uL (ref 1.7–7.7)
Platelets: 301 10*3/uL (ref 150–400)
RDW: 13.7 % (ref 11.5–15.5)
WBC: 6.6 10*3/uL (ref 4.0–10.5)

## 2012-12-04 LAB — LIPID PANEL
HDL: 91 mg/dL (ref >39)
LDL Cholesterol: 151 mg/dL — ABNORMAL HIGH (ref 0–99)
Total CHOL/HDL Ratio: 3 Ratio

## 2012-12-04 LAB — RPR

## 2012-12-04 LAB — COMPREHENSIVE METABOLIC PANEL
ALT: 466 U/L — ABNORMAL HIGH (ref 0–35)
AST: 109 U/L — ABNORMAL HIGH (ref 0–37)
Alkaline Phosphatase: 214 U/L — ABNORMAL HIGH (ref 39–117)
BUN: 15 mg/dL (ref 6–23)
Calcium: 9.6 mg/dL (ref 8.4–10.5)
Chloride: 107 mEq/L (ref 96–112)
Creat: 0.67 mg/dL (ref 0.50–1.10)
Potassium: 4.7 mEq/L (ref 3.5–5.3)

## 2012-12-05 LAB — T-HELPER CELL (CD4) - (RCID CLINIC ONLY): CD4 % Helper T Cell: 41 % (ref 33–55)

## 2012-12-05 LAB — HIV-1 RNA QUANT-NO REFLEX-BLD: HIV 1 RNA Quant: <20 copies/mL (ref <20)

## 2012-12-09 ENCOUNTER — Other Ambulatory Visit: Payer: Self-pay | Admitting: Infectious Diseases

## 2012-12-09 DIAGNOSIS — Z1231 Encounter for screening mammogram for malignant neoplasm of breast: Secondary | ICD-10-CM

## 2012-12-18 ENCOUNTER — Ambulatory Visit: Payer: Medicare PPO | Admitting: Infectious Diseases

## 2013-01-01 ENCOUNTER — Ambulatory Visit (HOSPITAL_COMMUNITY): Payer: Medicare PPO

## 2013-01-08 ENCOUNTER — Encounter: Payer: Self-pay | Admitting: Infectious Diseases

## 2013-01-08 ENCOUNTER — Other Ambulatory Visit: Payer: Self-pay | Admitting: *Deleted

## 2013-01-08 ENCOUNTER — Ambulatory Visit
Admission: RE | Admit: 2013-01-08 | Discharge: 2013-01-08 | Disposition: A | Discharge status: Home / Self Care | Payer: Medicare PPO | Source: Ambulatory Visit | Attending: Infectious Diseases | Admitting: Infectious Diseases

## 2013-01-08 ENCOUNTER — Ambulatory Visit (INDEPENDENT_AMBULATORY_CARE_PROVIDER_SITE_OTHER): Payer: Medicare PPO | Admitting: Infectious Diseases

## 2013-01-08 VITALS — BP 121/82 | HR 69 | Temp 98.2°F | Ht 64.0 in | Wt 227.0 lb

## 2013-01-08 DIAGNOSIS — S93409A Sprain of unspecified ligament of unspecified ankle, initial encounter: Secondary | ICD-10-CM | POA: Insufficient documentation

## 2013-01-08 DIAGNOSIS — R739 Hyperglycemia, unspecified: Secondary | ICD-10-CM | POA: Insufficient documentation

## 2013-01-08 DIAGNOSIS — B20 Human immunodeficiency virus [HIV] disease: Secondary | ICD-10-CM

## 2013-01-08 DIAGNOSIS — F419 Anxiety disorder, unspecified: Secondary | ICD-10-CM

## 2013-01-08 DIAGNOSIS — Z9109 Other allergy status, other than to drugs and biological substances: Secondary | ICD-10-CM

## 2013-01-08 DIAGNOSIS — J45909 Unspecified asthma, uncomplicated: Secondary | ICD-10-CM

## 2013-01-08 DIAGNOSIS — Z21 Asymptomatic human immunodeficiency virus [HIV] infection status: Secondary | ICD-10-CM

## 2013-01-08 DIAGNOSIS — Z889 Allergy status to unspecified drugs, medicaments and biological substances status: Secondary | ICD-10-CM

## 2013-01-08 DIAGNOSIS — E785 Hyperlipidemia, unspecified: Secondary | ICD-10-CM

## 2013-01-08 DIAGNOSIS — I10 Essential (primary) hypertension: Secondary | ICD-10-CM

## 2013-01-08 DIAGNOSIS — K219 Gastro-esophageal reflux disease without esophagitis: Secondary | ICD-10-CM

## 2013-01-08 DIAGNOSIS — R7309 Other abnormal glucose: Secondary | ICD-10-CM

## 2013-01-08 DIAGNOSIS — F411 Generalized anxiety disorder: Secondary | ICD-10-CM

## 2013-01-08 LAB — COMPLETE METABOLIC PANEL WITH GFR
ALT: 31 U/L (ref 0–35)
CO2: 25 mEq/L (ref 19–32)
Calcium: 9.7 mg/dL (ref 8.4–10.5)
Chloride: 106 mEq/L (ref 96–112)
Creat: 0.68 mg/dL (ref 0.50–1.10)
GFR, Est African American: >89 mL/min
Total Protein: 6.9 g/dL (ref 6.0–8.3)

## 2013-01-08 LAB — HEMOGLOBIN A1C: Mean Plasma Glucose: 134 mg/dL — ABNORMAL HIGH (ref <117)

## 2013-01-08 MED ORDER — EFAVIRENZ-EMTRICITAB-TENOFOVIR 600-200-300 MG PO TABS: 1.0000 | ORAL_TABLET | Freq: Every day | ORAL | Status: DC

## 2013-01-08 MED ORDER — LANSOPRAZOLE 30 MG PO CPDR: 30.0000 mg | DELAYED_RELEASE_CAPSULE | Freq: Two times a day (BID) | ORAL | Status: DC

## 2013-01-08 MED ORDER — ATENOLOL 25 MG PO TABS: 25.0000 mg | ORAL_TABLET | Freq: Every day | ORAL | Status: DC

## 2013-01-08 MED ORDER — LORAZEPAM 1 MG PO TABS: 1.0000 mg | ORAL_TABLET | Freq: Three times a day (TID) | ORAL | Status: DC | PRN

## 2013-01-08 MED ORDER — FLUTICASONE PROPIONATE HFA 110 MCG/ACT IN AERO: 1.0000 | INHALATION_SPRAY | Freq: Every day | RESPIRATORY_TRACT | Status: DC

## 2013-01-08 MED ORDER — ALBUTEROL SULFATE HFA 108 (90 BASE) MCG/ACT IN AERS: 1.0000 | INHALATION_SPRAY | RESPIRATORY_TRACT | Status: DC | PRN

## 2013-01-08 MED ORDER — FLUTICASONE PROPIONATE 50 MCG/ACT NA SUSP: 1.0000 | Freq: Every day | NASAL | Status: DC

## 2013-01-08 NOTE — Assessment & Plan Note (Signed)
Will recheck Glc, check HgBA1C

## 2013-01-08 NOTE — Assessment & Plan Note (Signed)
Will check her plain films. Continue OTCs, ice

## 2013-01-08 NOTE — Progress Notes (Signed)
  Subjective:    Patient ID: Sheri Davis, female    DOB: 05/27/1961, 52 y.o.   MRN: [id number]  HPI 52 yo F with HIV+ 2007, HTN (on atenolol), hyperlipidemia (on pravastatin), depression.  Has been on atripla since dx. Also taking herbal supplements (hawthorne berry, garlic, horsetail, mytaki/shitaki mushroom, astragulis root).   At prev visit, her labs showed Glc 154 and elevated LFTs. She has not been taking statin. Has pain in L ankle ater falling down some steps 6 days ago. Has been swollen and painful. Ibuprofen has been upsetting her stomach. BP elevated today.   HIV 1 RNA Quant (copies/mL)  Date Value  12/04/2012 <20   08/14/2012 <20   01/10/2012 <20      CD4 T Cell Abs (cmm)  Date Value  12/04/2012 1000   08/14/2012 1160   01/10/2012 1400       Review of Systems  Constitutional: Negative for fever, chills, appetite change, fatigue and unexpected weight change.  Cardiovascular: Negative for chest pain.  Gastrointestinal: Negative for diarrhea and constipation.  Endocrine: Negative for polyuria.  Genitourinary: Negative for dysuria.  Musculoskeletal: Negative for arthralgias.  Neurological: Negative for headaches.       Objective:   Physical Exam  Constitutional: She appears well-developed and well-nourished.  HENT:  Mouth/Throat: No oropharyngeal exudate.  Eyes: EOM are normal. Pupils are equal, round, and reactive to light.  Neck: Neck supple.  Cardiovascular: Normal rate, regular rhythm and normal heart sounds.   Pulmonary/Chest: Effort normal and breath sounds normal. No respiratory distress.  Abdominal: Soft. Bowel sounds are normal. There is no tenderness.  Musculoskeletal:       Feet:  Lymphadenopathy:    She has no cervical adenopathy.          Assessment & Plan:

## 2013-01-08 NOTE — Assessment & Plan Note (Signed)
Has improved, she attributes this to herbals. Not taking statin.

## 2013-01-08 NOTE — Assessment & Plan Note (Signed)
Improves on re-check in clinic. Continue on atenolol.

## 2013-01-08 NOTE — Assessment & Plan Note (Addendum)
She appears to be doing well. Counseled her about her drop in CD4 (non-significant). Offered condoms. Offered colonoscopy (refuses, too scared). Will see her back in 3 months. Will recheck her LFTs

## 2013-01-08 NOTE — Addendum Note (Signed)
Addended by: Azarah Dacy C on: 01/08/2013 10:54 AM   Modules accepted: Orders

## 2013-01-14 ENCOUNTER — Telehealth: Payer: Self-pay | Admitting: *Deleted

## 2013-01-14 NOTE — Telephone Encounter (Signed)
Called patient and notified of appt with PCP, Dr. Nehemiah Settle, [name] at [facility] for 02/12/13 at 11:00 AM. Also her nurse visit for the colonoscopy with Dr. Laural Benes also at [facility] for 03/05/13 at 2:45 pm.  I let her know that if she no shows, Dr. Laural Benes will not see her again as she has had 2 prior no shows. Sheri Davis

## 2013-01-22 ENCOUNTER — Ambulatory Visit (HOSPITAL_COMMUNITY)
Admission: RE | Admit: 2013-01-22 | Discharge: 2013-01-22 | Disposition: A | Discharge status: Home / Self Care | Payer: Medicare PPO | Source: Ambulatory Visit | Attending: Infectious Diseases | Admitting: Infectious Diseases

## 2013-01-22 DIAGNOSIS — Z1231 Encounter for screening mammogram for malignant neoplasm of breast: Secondary | ICD-10-CM | POA: Insufficient documentation

## 2013-01-27 ENCOUNTER — Other Ambulatory Visit: Payer: Self-pay | Admitting: Infectious Diseases

## 2013-02-11 ENCOUNTER — Other Ambulatory Visit: Payer: Self-pay | Admitting: *Deleted

## 2013-02-11 DIAGNOSIS — Z789 Other specified health status: Secondary | ICD-10-CM

## 2013-02-11 MED ORDER — CROMOLYN SODIUM 4 % OP SOLN: 1.0000 [drp] | Freq: Four times a day (QID) | OPHTHALMIC | Status: DC | PRN

## 2013-02-13 ENCOUNTER — Other Ambulatory Visit: Payer: Self-pay | Admitting: *Deleted

## 2013-02-13 DIAGNOSIS — Z789 Other specified health status: Secondary | ICD-10-CM

## 2013-02-13 DIAGNOSIS — J45909 Unspecified asthma, uncomplicated: Secondary | ICD-10-CM

## 2013-02-13 DIAGNOSIS — F419 Anxiety disorder, unspecified: Secondary | ICD-10-CM

## 2013-02-13 DIAGNOSIS — Z889 Allergy status to unspecified drugs, medicaments and biological substances status: Secondary | ICD-10-CM

## 2013-02-13 DIAGNOSIS — K219 Gastro-esophageal reflux disease without esophagitis: Secondary | ICD-10-CM

## 2013-02-13 MED ORDER — LANSOPRAZOLE 30 MG PO CPDR: 30.0000 mg | DELAYED_RELEASE_CAPSULE | Freq: Two times a day (BID) | ORAL | Status: DC

## 2013-02-13 MED ORDER — LORAZEPAM 1 MG PO TABS: 1.0000 mg | ORAL_TABLET | Freq: Three times a day (TID) | ORAL | Status: DC | PRN

## 2013-02-13 MED ORDER — FLUTICASONE PROPIONATE 50 MCG/ACT NA SUSP: 1.0000 | Freq: Every day | NASAL | Status: DC

## 2013-02-13 MED ORDER — CROMOLYN SODIUM 4 % OP SOLN: 1.0000 [drp] | Freq: Four times a day (QID) | OPHTHALMIC | Status: DC | PRN

## 2013-02-13 MED ORDER — FLUTICASONE PROPIONATE HFA 110 MCG/ACT IN AERO: 1.0000 | INHALATION_SPRAY | Freq: Every day | RESPIRATORY_TRACT | Status: DC

## 2013-03-05 ENCOUNTER — Other Ambulatory Visit: Payer: Self-pay | Admitting: Gastroenterology

## 2013-03-28 ENCOUNTER — Encounter (HOSPITAL_COMMUNITY): Payer: Self-pay | Admitting: *Deleted

## 2013-04-02 ENCOUNTER — Other Ambulatory Visit: Payer: Medicare PPO

## 2013-04-02 ENCOUNTER — Other Ambulatory Visit: Payer: Self-pay | Admitting: Infectious Diseases

## 2013-04-02 ENCOUNTER — Other Ambulatory Visit: Payer: Self-pay | Admitting: *Deleted

## 2013-04-02 DIAGNOSIS — Z79899 Other long term (current) drug therapy: Secondary | ICD-10-CM

## 2013-04-02 DIAGNOSIS — B20 Human immunodeficiency virus [HIV] disease: Secondary | ICD-10-CM

## 2013-04-02 LAB — CBC WITH DIFFERENTIAL/PLATELET
Basophils Absolute: 0 10*3/uL (ref 0.0–0.1)
Basophils Relative: 0 % (ref 0–1)
Eosinophils Absolute: 0.2 10*3/uL (ref 0.0–0.7)
Eosinophils Relative: 3 % (ref 0–5)
HCT: 35.6 % — ABNORMAL LOW (ref 36.0–46.0)
MCHC: 34.6 g/dL (ref 30.0–36.0)
MCV: 89.4 fL (ref 78.0–100.0)
Monocytes Absolute: 0.4 10*3/uL (ref 0.1–1.0)
Platelets: 303 10*3/uL (ref 150–400)
RDW: 13.5 % (ref 11.5–15.5)

## 2013-04-02 LAB — HEPATIC FUNCTION PANEL
ALT: 120 U/L — ABNORMAL HIGH (ref 0–35)
AST: 24 U/L (ref 0–37)
Albumin: 4.1 g/dL (ref 3.5–5.2)
Alkaline Phosphatase: 131 U/L — ABNORMAL HIGH (ref 39–117)
Total Protein: 6.7 g/dL (ref 6.0–8.3)

## 2013-04-02 LAB — COMPLETE METABOLIC PANEL WITH GFR
AST: 24 U/L (ref 0–37)
Albumin: 4.1 g/dL (ref 3.5–5.2)
Alkaline Phosphatase: 131 U/L — ABNORMAL HIGH (ref 39–117)
Calcium: 9 mg/dL (ref 8.4–10.5)
Chloride: 105 mEq/L (ref 96–112)
Glucose, Bld: 98 mg/dL (ref 70–99)
Potassium: 5 mEq/L (ref 3.5–5.3)
Sodium: 136 mEq/L (ref 135–145)
Total Protein: 6.7 g/dL (ref 6.0–8.3)

## 2013-04-09 ENCOUNTER — Encounter (HOSPITAL_COMMUNITY): Payer: Self-pay | Admitting: Pharmacy Technician

## 2013-04-16 ENCOUNTER — Encounter: Payer: Self-pay | Admitting: Infectious Diseases

## 2013-04-16 ENCOUNTER — Ambulatory Visit (INDEPENDENT_AMBULATORY_CARE_PROVIDER_SITE_OTHER): Payer: Medicare PPO | Admitting: Infectious Diseases

## 2013-04-16 VITALS — BP 119/84 | HR 58 | Temp 98.2°F | Ht 65.0 in | Wt 226.0 lb

## 2013-04-16 DIAGNOSIS — E785 Hyperlipidemia, unspecified: Secondary | ICD-10-CM

## 2013-04-16 DIAGNOSIS — R739 Hyperglycemia, unspecified: Secondary | ICD-10-CM

## 2013-04-16 DIAGNOSIS — I10 Essential (primary) hypertension: Secondary | ICD-10-CM

## 2013-04-16 DIAGNOSIS — B20 Human immunodeficiency virus [HIV] disease: Secondary | ICD-10-CM

## 2013-04-16 DIAGNOSIS — R7309 Other abnormal glucose: Secondary | ICD-10-CM

## 2013-04-16 DIAGNOSIS — S93409A Sprain of unspecified ligament of unspecified ankle, initial encounter: Secondary | ICD-10-CM

## 2013-04-16 NOTE — Progress Notes (Signed)
  Subjective:    Patient ID: Sheri Davis, female    DOB: 1961/11/15, 52 y.o.   MRN: [id number]  HPI 52 yo F with HIV+ 2007, HTN (on atenolol), hyperlipidemia (off pravachol), depression.  Has been on atripla since dx. Also taking herbal supplements (hawthorne berry, garlic, horsetail, mytaki/shitaki mushroom, astragulis root).  Has been noted to have increased ALT and Alk Phos at last labs. She has also had increased Glc, A1C (6.3%). Has been trying to watch her diet and exercise more.  Is scheduled for colonoscopy 04-22-13.   HIV 1 RNA Quant (copies/mL)  Date Value  04/02/2013 <20   12/04/2012 <20   08/14/2012 <20      CD4 T Cell Abs (cmm)  Date Value  04/02/2013 1160   12/04/2012 1000   08/14/2012 1160       Review of Systems  Constitutional: Negative for appetite change and unexpected weight change.  Eyes: Negative for visual disturbance.  Gastrointestinal: Negative for diarrhea and constipation.  Genitourinary: Negative for difficulty urinating and menstrual problem.       Amenorrheic  Neurological: Negative for headaches.       Objective:   Physical Exam  Constitutional: She appears well-developed and well-nourished.  HENT:  Mouth/Throat: No oropharyngeal exudate.  Eyes: EOM are normal. Pupils are equal, round, and reactive to light.  Neck: Neck supple.  Cardiovascular: Normal rate, regular rhythm and normal heart sounds.   Pulmonary/Chest: Effort normal and breath sounds normal.  Abdominal: Soft. Bowel sounds are normal. There is no tenderness. There is no rebound.  Musculoskeletal: She exhibits edema.  Trace edema.   Lymphadenopathy:    She has no cervical adenopathy.          Assessment & Plan:

## 2013-04-16 NOTE — Assessment & Plan Note (Signed)
Well controlled today.

## 2013-04-16 NOTE — Assessment & Plan Note (Signed)
Still some swelling but her films were (-). Will mark as resolved.

## 2013-04-16 NOTE — Assessment & Plan Note (Signed)
She has f/u appt with Dr Nehemiah Settle. I greatly appreciate him partnering with [place]. I encouraged her to diet and exercise.

## 2013-04-16 NOTE — Assessment & Plan Note (Signed)
She is doing very well. She is concerned that the [place] is increasing her LFTs. I asked her to stop taking the supplements and we will recheck her LFTs after that. She will rtc in 6 months. She is offered/refuses condoms.

## 2013-04-16 NOTE — Assessment & Plan Note (Signed)
She is off statin. Her Cholesterol has improved despite this.

## 2013-04-22 ENCOUNTER — Encounter (HOSPITAL_COMMUNITY)
Admission: RE | Disposition: A | Discharge status: Home / Self Care | Payer: Self-pay | Source: Ambulatory Visit | Attending: Gastroenterology

## 2013-04-22 ENCOUNTER — Encounter (HOSPITAL_COMMUNITY): Payer: Self-pay | Admitting: Registered Nurse

## 2013-04-22 ENCOUNTER — Encounter (HOSPITAL_COMMUNITY): Payer: Self-pay | Admitting: *Deleted

## 2013-04-22 ENCOUNTER — Ambulatory Visit (HOSPITAL_COMMUNITY)
Admission: RE | Admit: 2013-04-22 | Discharge: 2013-04-22 | Disposition: A | Discharge status: Home / Self Care | Payer: Medicare PPO | Source: Ambulatory Visit | Attending: Gastroenterology | Admitting: Gastroenterology

## 2013-04-22 ENCOUNTER — Ambulatory Visit (HOSPITAL_COMMUNITY): Payer: Medicare PPO | Admitting: Registered Nurse

## 2013-04-22 DIAGNOSIS — Z79899 Other long term (current) drug therapy: Secondary | ICD-10-CM | POA: Insufficient documentation

## 2013-04-22 DIAGNOSIS — J45909 Unspecified asthma, uncomplicated: Secondary | ICD-10-CM | POA: Insufficient documentation

## 2013-04-22 DIAGNOSIS — Z1211 Encounter for screening for malignant neoplasm of colon: Secondary | ICD-10-CM | POA: Insufficient documentation

## 2013-04-22 DIAGNOSIS — E669 Obesity, unspecified: Secondary | ICD-10-CM | POA: Insufficient documentation

## 2013-04-22 DIAGNOSIS — Z21 Asymptomatic human immunodeficiency virus [HIV] infection status: Secondary | ICD-10-CM | POA: Insufficient documentation

## 2013-04-22 DIAGNOSIS — K219 Gastro-esophageal reflux disease without esophagitis: Secondary | ICD-10-CM | POA: Insufficient documentation

## 2013-04-22 DIAGNOSIS — I10 Essential (primary) hypertension: Secondary | ICD-10-CM | POA: Insufficient documentation

## 2013-04-22 HISTORY — DX: Gastro-esophageal reflux disease without esophagitis: K21.9

## 2013-04-22 HISTORY — DX: Unspecified asthma, uncomplicated: J45.909

## 2013-04-22 HISTORY — PX: COLONOSCOPY WITH PROPOFOL: SHX5780

## 2013-04-22 SURGERY — COLONOSCOPY WITH PROPOFOL
Anesthesia: Monitor Anesthesia Care

## 2013-04-22 MED ORDER — SODIUM CHLORIDE 0.9 % IV SOLN: INTRAVENOUS | Status: DC

## 2013-04-22 MED ORDER — LACTATED RINGERS IV SOLN
INTRAVENOUS | Status: DC | PRN
  Administered 2013-04-22: 07:00:00 via INTRAVENOUS

## 2013-04-22 MED ORDER — FENTANYL CITRATE 0.05 MG/ML IJ SOLN
INTRAMUSCULAR | Status: DC | PRN
  Administered 2013-04-22: 50 ug via INTRAVENOUS

## 2013-04-22 MED ORDER — PROPOFOL INFUSION 10 MG/ML OPTIME
INTRAVENOUS | Status: DC | PRN
  Administered 2013-04-22: 75 ug/kg/min via INTRAVENOUS

## 2013-04-22 MED ORDER — LIDOCAINE HCL (CARDIAC) 20 MG/ML IV SOLN
INTRAVENOUS | Status: DC | PRN
  Administered 2013-04-22: 100 mg via INTRAVENOUS

## 2013-04-22 MED ORDER — KETAMINE HCL 10 MG/ML IJ SOLN
INTRAMUSCULAR | Status: DC | PRN
  Administered 2013-04-22: 30 mg via INTRAVENOUS

## 2013-04-22 MED ORDER — MIDAZOLAM HCL 5 MG/5ML IJ SOLN
INTRAMUSCULAR | Status: DC | PRN
  Administered 2013-04-22: 2 mg via INTRAVENOUS

## 2013-04-22 SURGICAL SUPPLY — 22 items

## 2013-04-22 NOTE — Transfer of Care (Signed)
Immediate Anesthesia Transfer of Care Note  Patient: Sheri Davis  Procedure(s) Performed: Procedure(s): COLONOSCOPY WITH PROPOFOL (N/A)  Patient Location: PACU and Endoscopy Unit  Anesthesia Type:MAC  Level of Consciousness: awake, alert , oriented and patient cooperative  Airway & Oxygen Therapy: Patient Spontanous Breathing and Patient connected to face mask oxygen  Post-op Assessment: Report given to PACU RN, Post -op Vital signs reviewed and stable and Patient moving all extremities  Post vital signs: Reviewed and stable  Complications: No apparent anesthesia complications

## 2013-04-22 NOTE — Op Note (Signed)
Procedure: Baseline screening colonoscopy  Endoscopist: Danise Edge  Premedication: Propofol administered by anesthesia  Procedure: The patient was placed in the left lateral decubitus position. Anal inspection and digital rectal exam were normal. The Pentax pediatric colonoscope was introduced into the rectum and easily advanced to the cecum. A normal-appearing ileocecal valve and appendiceal orifice were identified. Colonic preparation for the exam today was good.  Rectum. Normal. Retroflexed view of the distal rectum normal.  Sigmoid colon and descending colon. Normal.  Splenic flexure. Normal.  Transverse colon. Normal.  Hepatic flexure. Normal.  Ascending colon. Normal.  Cecum and ileocecal valve. Normal.  Assessment: Normal baseline screening proctocolonoscopy to the cecum  Recommendations: Schedule repeat screening colonoscopy in 10 years.

## 2013-04-22 NOTE — Preoperative (Signed)
Beta Blockers   Reason not to administer Beta Blockers:Atenolol taken 2300 04-21-13

## 2013-04-22 NOTE — Anesthesia Preprocedure Evaluation (Addendum)
Anesthesia Evaluation  Patient identified by MRN, date of birth, ID band Patient awake    Reviewed: Allergy & Precautions, H&P , NPO status , Patient's Chart, lab work & pertinent test results, reviewed documented beta blocker date and time   Airway Mallampati: II TM Distance: >3 FB Neck ROM: full    Dental no notable dental hx. (+) Teeth Intact and Dental Advisory Given   Pulmonary asthma ,  breath sounds clear to auscultation  Pulmonary exam normal       Cardiovascular hypertension, Pt. on home beta blockers Rhythm:regular Rate:Normal     Neuro/Psych negative neurological ROS  negative psych ROS   GI/Hepatic negative GI ROS, Neg liver ROS, GERD-  Medicated and Controlled,  Endo/Other  negative endocrine ROS  Renal/GU negative Renal ROS  negative genitourinary   Musculoskeletal   Abdominal   Peds  Hematology  (+) HIV, JEHOVAH'S WITNESS  Anesthesia Other Findings   Reproductive/Obstetrics negative OB ROS                        Anesthesia Physical Anesthesia Plan  ASA: III  Anesthesia Plan: MAC   Post-op Pain Management:    Induction:   Airway Management Planned:   Additional Equipment:   Intra-op Plan:   Post-operative Plan:   Informed Consent: I have reviewed the patients History and Physical, chart, labs and discussed the procedure including the risks, benefits and alternatives for the proposed anesthesia with the patient or authorized representative who has indicated his/her understanding and acceptance.   Dental Advisory Given  Plan Discussed with: CRNA and Surgeon  Anesthesia Plan Comments:         Anesthesia Quick Evaluation

## 2013-04-22 NOTE — Anesthesia Postprocedure Evaluation (Signed)
  Anesthesia Post-op Note  Patient: Sheri Davis  Procedure(s) Performed: Procedure(s) (LRB): COLONOSCOPY WITH PROPOFOL (N/A)  Patient Location: PACU  Anesthesia Type: MAC  Level of Consciousness: awake and alert   Airway and Oxygen Therapy: Patient Spontanous Breathing  Post-op Pain: mild  Post-op Assessment: Post-op Vital signs reviewed, Patient's Cardiovascular Status Stable, Respiratory Function Stable, Patent Airway and No signs of Nausea or vomiting  Last Vitals:  Filed Vitals:   04/22/13 0705  BP: 149/95  Pulse: 57  Temp: 36.7 C  Resp: 10    Post-op Vital Signs: stable   Complications: No apparent anesthesia complications

## 2013-04-22 NOTE — H&P (Signed)
  Procedure: Colonoscopy  History: The patient is a 52 year old female born 15-Aug-1961. The patient is scheduled to undergo her first screening colonoscopy with polypectomy to prevent colon cancer.   Chronic medications: Atenolol. Lorazepam. Atripla. AcipHex. Albuterol. Flonase. Prevacid. Pravachol.  Past medical and surgical history: HIV positive since 2007. Hypertension. Asthma. Gastroesophageal reflux. Depression. Obesity.  Habits: The patient is never smoked cigarettes. She consumes alcohol in moderation.  Exam: The patient is alert and lying comfortably on the endoscopy stretcher. Abdomen is soft, flat, and nontender to palpation in all quadrants. Cardiac exam reveals a regular rhythm. Lungs are clear to auscultation.  Plan: Proceed with baseline screening colonoscopy.

## 2013-05-19 ENCOUNTER — Telehealth: Payer: Self-pay | Admitting: *Deleted

## 2013-05-19 NOTE — Telephone Encounter (Signed)
Pt continues to have left ankle pain and swelling from injury in February 2014.  Requesting appt.

## 2013-05-27 ENCOUNTER — Ambulatory Visit: Payer: Medicare PPO | Admitting: Internal Medicine

## 2013-06-24 ENCOUNTER — Other Ambulatory Visit: Payer: Self-pay | Admitting: Infectious Diseases

## 2013-06-25 ENCOUNTER — Other Ambulatory Visit: Payer: Self-pay | Admitting: Licensed Clinical Social Worker

## 2013-06-25 DIAGNOSIS — Z789 Other specified health status: Secondary | ICD-10-CM

## 2013-06-25 DIAGNOSIS — F419 Anxiety disorder, unspecified: Secondary | ICD-10-CM

## 2013-06-25 MED ORDER — TRIAMCINOLONE ACETONIDE 0.1 % EX CREA: TOPICAL_CREAM | Freq: Two times a day (BID) | CUTANEOUS | Status: DC

## 2013-06-25 MED ORDER — LORAZEPAM 1 MG PO TABS: 1.0000 mg | ORAL_TABLET | Freq: Three times a day (TID) | ORAL | Status: DC | PRN

## 2013-07-08 ENCOUNTER — Other Ambulatory Visit: Payer: Self-pay | Admitting: Infectious Diseases

## 2013-07-21 ENCOUNTER — Other Ambulatory Visit: Payer: Self-pay | Admitting: Infectious Diseases

## 2013-08-06 ENCOUNTER — Other Ambulatory Visit: Payer: Self-pay | Admitting: Infectious Diseases

## 2013-08-06 DIAGNOSIS — Z789 Other specified health status: Secondary | ICD-10-CM

## 2013-09-01 ENCOUNTER — Ambulatory Visit: Payer: Medicare PPO

## 2013-09-02 ENCOUNTER — Ambulatory Visit: Payer: Medicare PPO

## 2013-09-04 ENCOUNTER — Telehealth: Payer: Self-pay | Admitting: *Deleted

## 2013-09-04 NOTE — Telephone Encounter (Signed)
Made new appt for 09/29/13.

## 2013-09-09 ENCOUNTER — Other Ambulatory Visit: Payer: Self-pay | Admitting: Infectious Diseases

## 2013-09-10 ENCOUNTER — Other Ambulatory Visit: Payer: Self-pay | Admitting: Infectious Diseases

## 2013-09-10 DIAGNOSIS — F411 Generalized anxiety disorder: Secondary | ICD-10-CM

## 2013-09-27 ENCOUNTER — Other Ambulatory Visit: Payer: Self-pay | Admitting: Infectious Diseases

## 2013-09-29 ENCOUNTER — Other Ambulatory Visit (INDEPENDENT_AMBULATORY_CARE_PROVIDER_SITE_OTHER): Payer: Medicare PPO

## 2013-09-29 ENCOUNTER — Other Ambulatory Visit (HOSPITAL_COMMUNITY)
Admission: RE | Admit: 2013-09-29 | Discharge: 2013-09-29 | Disposition: A | Discharge status: Home / Self Care | Payer: Medicare PPO | Source: Ambulatory Visit | Attending: Infectious Diseases | Admitting: Infectious Diseases

## 2013-09-29 ENCOUNTER — Ambulatory Visit (INDEPENDENT_AMBULATORY_CARE_PROVIDER_SITE_OTHER): Payer: Medicare PPO | Admitting: *Deleted

## 2013-09-29 DIAGNOSIS — H6120 Impacted cerumen, unspecified ear: Secondary | ICD-10-CM

## 2013-09-29 DIAGNOSIS — H6123 Impacted cerumen, bilateral: Secondary | ICD-10-CM

## 2013-09-29 DIAGNOSIS — B20 Human immunodeficiency virus [HIV] disease: Secondary | ICD-10-CM

## 2013-09-29 DIAGNOSIS — Z124 Encounter for screening for malignant neoplasm of cervix: Secondary | ICD-10-CM | POA: Insufficient documentation

## 2013-09-29 LAB — COMPREHENSIVE METABOLIC PANEL
ALT: 56 U/L — ABNORMAL HIGH (ref 0–35)
AST: 19 U/L (ref 0–37)
BUN: 16 mg/dL (ref 6–23)
CO2: 24 mEq/L (ref 19–32)
Calcium: 9.6 mg/dL (ref 8.4–10.5)
Chloride: 103 mEq/L (ref 96–112)
Creat: 0.67 mg/dL (ref 0.50–1.10)
Total Bilirubin: 0.2 mg/dL — ABNORMAL LOW (ref 0.3–1.2)

## 2013-09-29 LAB — CBC
Hemoglobin: 13.5 g/dL (ref 12.0–15.0)
MCH: 32.1 pg (ref 26.0–34.0)
MCHC: 35.2 g/dL (ref 30.0–36.0)
MCV: 91.2 fL (ref 78.0–100.0)
RBC: 4.21 MIL/uL (ref 3.87–5.11)

## 2013-09-29 NOTE — Progress Notes (Signed)
  Subjective:     Sheri Davis is a 52 y.o. woman who comes in today for a  pap smear only.  Previous abnormal Pap smears: no. Contraception: condoms.  Pt states that her right ear is "popping and seems like there is water in it.  Left ear has wax."  Assessed ear canal.  Wax present. Objective:    There were no vitals taken for this visit. Pelvic Exam:  Pap smear obtained.   Assessment:    Screening pap smear.   Plan:    Follow up in one year, or as indicated by Pap results.  Pt given educational materials re: HIV and women, self-esteem, nutrition and diet management, PAP smears and partner safety. Pt given condoms.   Obtained order for ear wax removal from Dr. Ninetta Lights.

## 2013-09-30 LAB — HIV-1 RNA QUANT-NO REFLEX-BLD: HIV 1 RNA Quant: <20 copies/mL (ref <20)

## 2013-09-30 LAB — T-HELPER CELL (CD4) - (RCID CLINIC ONLY)
CD4 % Helper T Cell: 44 % (ref 33–55)
CD4 T Cell Abs: 1420 /uL (ref 400–2700)

## 2013-10-01 ENCOUNTER — Encounter: Payer: Self-pay | Admitting: *Deleted

## 2013-10-08 ENCOUNTER — Other Ambulatory Visit: Payer: Medicare PPO

## 2013-10-20 ENCOUNTER — Encounter: Payer: Self-pay | Admitting: Infectious Diseases

## 2013-10-20 ENCOUNTER — Other Ambulatory Visit: Payer: Self-pay | Admitting: *Deleted

## 2013-10-20 ENCOUNTER — Ambulatory Visit (INDEPENDENT_AMBULATORY_CARE_PROVIDER_SITE_OTHER): Payer: Medicare PPO | Admitting: Infectious Diseases

## 2013-10-20 VITALS — BP 126/86 | HR 60 | Temp 98.0°F | Ht 64.5 in | Wt 224.0 lb

## 2013-10-20 DIAGNOSIS — F4310 Post-traumatic stress disorder, unspecified: Secondary | ICD-10-CM

## 2013-10-20 DIAGNOSIS — Z79899 Other long term (current) drug therapy: Secondary | ICD-10-CM

## 2013-10-20 DIAGNOSIS — F411 Generalized anxiety disorder: Secondary | ICD-10-CM

## 2013-10-20 DIAGNOSIS — J45909 Unspecified asthma, uncomplicated: Secondary | ICD-10-CM

## 2013-10-20 DIAGNOSIS — B20 Human immunodeficiency virus [HIV] disease: Secondary | ICD-10-CM

## 2013-10-20 DIAGNOSIS — I10 Essential (primary) hypertension: Secondary | ICD-10-CM

## 2013-10-20 DIAGNOSIS — Z113 Encounter for screening for infections with a predominantly sexual mode of transmission: Secondary | ICD-10-CM

## 2013-10-20 MED ORDER — LORAZEPAM 1 MG PO TABS: ORAL_TABLET | ORAL | Status: DC

## 2013-10-20 MED ORDER — ATENOLOL 25 MG PO TABS: ORAL_TABLET | ORAL | Status: DC

## 2013-10-20 MED ORDER — TEMAZEPAM 15 MG PO CAPS: 15.0000 mg | ORAL_CAPSULE | Freq: Every evening | ORAL | Status: DC | PRN

## 2013-10-20 MED ORDER — FLUTICASONE PROPIONATE HFA 110 MCG/ACT IN AERO: 1.0000 | INHALATION_SPRAY | Freq: Every day | RESPIRATORY_TRACT | Status: DC | PRN

## 2013-10-20 MED ORDER — ALBUTEROL SULFATE HFA 108 (90 BASE) MCG/ACT IN AERS: 1.0000 | INHALATION_SPRAY | RESPIRATORY_TRACT | Status: DC | PRN

## 2013-10-20 NOTE — Progress Notes (Signed)
  Subjective:    Patient ID: Sheri Davis, female    DOB: 1961/10/10, 52 y.o.   MRN: [id number]  HPI 52 yo F with HIV+ 2007, HTN (on atenolol), hyperlipidemia (off pravachol), depression.  Has been on atripla since dx. Also taking herbal supplements (hawthorne berry, garlic, horsetail, mytaki/shitaki mushroom, astragulis root).  Missed clinic appts, was cancelled by her PMD.  No problems with ART. Has lost 3#.   HIV 1 RNA Quant (copies/mL)  Date Value  09/29/2013 <20   04/02/2013 <20   12/04/2012 <20      CD4 T Cell Abs (/uL)  Date Value  09/29/2013 1420   04/02/2013 1160   12/04/2012 1000     Review of Systems  Constitutional: Negative for appetite change and unexpected weight change.  Gastrointestinal: Negative for diarrhea and constipation.  Genitourinary: Negative for difficulty urinating.  normal PAP 09-2013.  Normal colonoscopy 03-2013 Mammogram due January.      Objective:   Physical Exam  Constitutional: She appears well-developed and well-nourished.  HENT:  Mouth/Throat: No oropharyngeal exudate.  Eyes: EOM are normal. Pupils are equal, round, and reactive to light.  Neck: Neck supple.  Cardiovascular: Normal rate, regular rhythm and normal heart sounds.   Pulmonary/Chest: Effort normal and breath sounds normal.  Abdominal: Soft. Bowel sounds are normal. She exhibits no distension. There is no tenderness.  Lymphadenopathy:    She has no cervical adenopathy.          Assessment & Plan:

## 2013-10-20 NOTE — Assessment & Plan Note (Signed)
Will refill her benzo, add restoril as well as this appears to be her major issue (sleep).

## 2013-10-20 NOTE — Assessment & Plan Note (Signed)
Continue beta blocker, she will try to get back into PCP.

## 2013-10-20 NOTE — Assessment & Plan Note (Signed)
She is doing well. vax are up to date (she does need PNVX til 66). Offered/refused condoms. Will see her back in 6 months. Has mamo scheduled.

## 2013-10-20 NOTE — Assessment & Plan Note (Signed)
Will refill her meds, try to get back into PCP.

## 2013-10-22 ENCOUNTER — Ambulatory Visit: Payer: Medicare PPO | Admitting: Infectious Diseases

## 2013-11-04 ENCOUNTER — Other Ambulatory Visit: Payer: Self-pay | Admitting: Infectious Diseases

## 2013-11-04 DIAGNOSIS — B20 Human immunodeficiency virus [HIV] disease: Secondary | ICD-10-CM

## 2013-12-10 ENCOUNTER — Other Ambulatory Visit: Payer: Self-pay | Admitting: Infectious Diseases

## 2013-12-10 DIAGNOSIS — F32A Depression, unspecified: Secondary | ICD-10-CM

## 2013-12-10 DIAGNOSIS — F329 Major depressive disorder, single episode, unspecified: Secondary | ICD-10-CM

## 2014-01-07 ENCOUNTER — Other Ambulatory Visit: Payer: Self-pay | Admitting: Infectious Diseases

## 2014-01-18 ENCOUNTER — Emergency Department: Payer: Self-pay | Admitting: Emergency Medicine

## 2014-02-02 ENCOUNTER — Other Ambulatory Visit: Payer: Self-pay | Admitting: Infectious Diseases

## 2014-02-02 DIAGNOSIS — F411 Generalized anxiety disorder: Secondary | ICD-10-CM

## 2014-02-09 ENCOUNTER — Other Ambulatory Visit: Payer: Self-pay | Admitting: Infectious Diseases

## 2014-02-13 ENCOUNTER — Other Ambulatory Visit: Payer: Self-pay | Admitting: Licensed Clinical Social Worker

## 2014-02-13 DIAGNOSIS — Z789 Other specified health status: Secondary | ICD-10-CM

## 2014-02-13 MED ORDER — CROMOLYN SODIUM 4 % OP SOLN
OPHTHALMIC | Status: DC
Start: 2014-02-13 — End: 2014-03-04

## 2014-03-02 ENCOUNTER — Other Ambulatory Visit: Payer: Self-pay | Admitting: Infectious Diseases

## 2014-03-04 ENCOUNTER — Other Ambulatory Visit: Payer: Self-pay | Admitting: Infectious Diseases

## 2014-03-23 ENCOUNTER — Other Ambulatory Visit: Payer: Self-pay | Admitting: Infectious Diseases

## 2014-03-25 ENCOUNTER — Other Ambulatory Visit: Payer: Self-pay | Admitting: Infectious Diseases

## 2014-03-25 DIAGNOSIS — Z1231 Encounter for screening mammogram for malignant neoplasm of breast: Secondary | ICD-10-CM

## 2014-04-01 ENCOUNTER — Ambulatory Visit (HOSPITAL_COMMUNITY): Payer: Medicare PPO

## 2014-04-16 ENCOUNTER — Ambulatory Visit (HOSPITAL_COMMUNITY): Payer: Medicare PPO

## 2014-04-23 ENCOUNTER — Ambulatory Visit (HOSPITAL_COMMUNITY): Payer: Medicare PPO

## 2014-04-27 ENCOUNTER — Other Ambulatory Visit: Payer: Self-pay | Admitting: Infectious Diseases

## 2014-04-27 MED ORDER — LANSOPRAZOLE 30 MG PO CPDR: 30.0000 mg | DELAYED_RELEASE_CAPSULE | Freq: Every day | ORAL | Status: DC

## 2014-04-28 ENCOUNTER — Other Ambulatory Visit: Payer: Self-pay | Admitting: Infectious Diseases

## 2014-04-29 ENCOUNTER — Other Ambulatory Visit: Payer: Medicare PPO

## 2014-04-30 ENCOUNTER — Ambulatory Visit (HOSPITAL_COMMUNITY): Payer: Medicare PPO

## 2014-04-30 ENCOUNTER — Other Ambulatory Visit (INDEPENDENT_AMBULATORY_CARE_PROVIDER_SITE_OTHER): Payer: Medicare PPO

## 2014-04-30 ENCOUNTER — Ambulatory Visit (HOSPITAL_COMMUNITY)
Admission: RE | Admit: 2014-04-30 | Discharge: 2014-04-30 | Disposition: A | Discharge status: Home / Self Care | Payer: Medicare PPO | Source: Ambulatory Visit | Attending: Infectious Diseases | Admitting: Infectious Diseases

## 2014-04-30 DIAGNOSIS — Z113 Encounter for screening for infections with a predominantly sexual mode of transmission: Secondary | ICD-10-CM

## 2014-04-30 DIAGNOSIS — Z1231 Encounter for screening mammogram for malignant neoplasm of breast: Secondary | ICD-10-CM | POA: Insufficient documentation

## 2014-04-30 DIAGNOSIS — Z79899 Other long term (current) drug therapy: Secondary | ICD-10-CM

## 2014-04-30 DIAGNOSIS — B20 Human immunodeficiency virus [HIV] disease: Secondary | ICD-10-CM

## 2014-04-30 LAB — COMPLETE METABOLIC PANEL WITH GFR
ALBUMIN: 4.3 g/dL (ref 3.5–5.2)
ALT: 20 U/L (ref 0–35)
AST: 19 U/L (ref 0–37)
Alkaline Phosphatase: 73 U/L (ref 39–117)
BUN: 13 mg/dL (ref 6–23)
CALCIUM: 9.6 mg/dL (ref 8.4–10.5)
CO2: 25 meq/L (ref 19–32)
CREATININE: 0.65 mg/dL (ref 0.50–1.10)
Chloride: 104 mEq/L (ref 96–112)
GFR, Est African American: >89 mL/min
GFR, Est Non African American: >89 mL/min
Glucose, Bld: 96 mg/dL (ref 70–99)
Potassium: 4.4 mEq/L (ref 3.5–5.3)
Sodium: 139 mEq/L (ref 135–145)
Total Bilirubin: 0.3 mg/dL (ref 0.2–1.2)
Total Protein: 7.1 g/dL (ref 6.0–8.3)

## 2014-04-30 LAB — LIPID PANEL
Cholesterol: 274 mg/dL — ABNORMAL HIGH (ref 0–200)
HDL: 64 mg/dL (ref >39)
LDL Cholesterol: 173 mg/dL — ABNORMAL HIGH (ref 0–99)
Total CHOL/HDL Ratio: 4.3 Ratio
Triglycerides: 185 mg/dL — ABNORMAL HIGH (ref <150)
VLDL: 37 mg/dL (ref 0–40)

## 2014-04-30 LAB — CBC WITH DIFFERENTIAL/PLATELET
Basophils Absolute: 0 10*3/uL (ref 0.0–0.1)
Basophils Relative: 0 % (ref 0–1)
EOS PCT: 2 % (ref 0–5)
Eosinophils Absolute: 0.1 10*3/uL (ref 0.0–0.7)
HEMATOCRIT: 39.2 % (ref 36.0–46.0)
Hemoglobin: 13.5 g/dL (ref 12.0–15.0)
LYMPHS PCT: 46 % (ref 12–46)
Lymphs Abs: 2.7 10*3/uL (ref 0.7–4.0)
MCH: 31.5 pg (ref 26.0–34.0)
MCHC: 34.4 g/dL (ref 30.0–36.0)
MCV: 91.4 fL (ref 78.0–100.0)
Monocytes Absolute: 0.3 10*3/uL (ref 0.1–1.0)
Monocytes Relative: 5 % (ref 3–12)
Neutro Abs: 2.8 10*3/uL (ref 1.7–7.7)
Neutrophils Relative %: 47 % (ref 43–77)
Platelets: 319 10*3/uL (ref 150–400)
RBC: 4.29 MIL/uL (ref 3.87–5.11)
RDW: 13.2 % (ref 11.5–15.5)
WBC: 5.9 10*3/uL (ref 4.0–10.5)

## 2014-05-01 LAB — T-HELPER CELL (CD4) - (RCID CLINIC ONLY)
CD4 T CELL ABS: 1140 /uL (ref 400–2700)
CD4 T CELL HELPER: 44 % (ref 33–55)

## 2014-05-01 LAB — RPR

## 2014-05-02 LAB — HIV-1 RNA QUANT-NO REFLEX-BLD
HIV 1 RNA QUANT: 23 {copies}/mL — AB (ref <20)
HIV-1 RNA QUANT, LOG: 1.36 {Log} — AB (ref <1.30)

## 2014-05-07 ENCOUNTER — Other Ambulatory Visit: Payer: Self-pay | Admitting: Infectious Diseases

## 2014-05-18 ENCOUNTER — Ambulatory Visit (INDEPENDENT_AMBULATORY_CARE_PROVIDER_SITE_OTHER): Payer: Medicare PPO | Admitting: Infectious Diseases

## 2014-05-18 ENCOUNTER — Encounter: Payer: Self-pay | Admitting: Infectious Diseases

## 2014-05-18 VITALS — BP 142/96 | HR 76 | Temp 98.3°F | Ht 65.0 in | Wt 230.2 lb

## 2014-05-18 DIAGNOSIS — F411 Generalized anxiety disorder: Secondary | ICD-10-CM

## 2014-05-18 DIAGNOSIS — L309 Dermatitis, unspecified: Secondary | ICD-10-CM

## 2014-05-18 DIAGNOSIS — B20 Human immunodeficiency virus [HIV] disease: Secondary | ICD-10-CM

## 2014-05-18 DIAGNOSIS — I10 Essential (primary) hypertension: Secondary | ICD-10-CM

## 2014-05-18 DIAGNOSIS — R739 Hyperglycemia, unspecified: Secondary | ICD-10-CM

## 2014-05-18 DIAGNOSIS — E785 Hyperlipidemia, unspecified: Secondary | ICD-10-CM

## 2014-05-18 DIAGNOSIS — J45909 Unspecified asthma, uncomplicated: Secondary | ICD-10-CM

## 2014-05-18 DIAGNOSIS — R7309 Other abnormal glucose: Secondary | ICD-10-CM

## 2014-05-18 DIAGNOSIS — F4310 Post-traumatic stress disorder, unspecified: Secondary | ICD-10-CM

## 2014-05-18 DIAGNOSIS — L259 Unspecified contact dermatitis, unspecified cause: Secondary | ICD-10-CM

## 2014-05-18 MED ORDER — LORAZEPAM 1 MG PO TABS: ORAL_TABLET | ORAL | Status: DC

## 2014-05-18 MED ORDER — CROMOLYN SODIUM 4 % OP SOLN: OPHTHALMIC | Status: DC

## 2014-05-18 MED ORDER — ALBUTEROL SULFATE HFA 108 (90 BASE) MCG/ACT IN AERS: INHALATION_SPRAY | RESPIRATORY_TRACT | Status: DC

## 2014-05-18 MED ORDER — EFAVIRENZ-EMTRICITAB-TENOFOVIR 600-200-300 MG PO TABS: ORAL_TABLET | ORAL | Status: DC

## 2014-05-18 MED ORDER — FLUTICASONE PROPIONATE HFA 110 MCG/ACT IN AERO: 1.0000 | INHALATION_SPRAY | Freq: Every day | RESPIRATORY_TRACT | Status: DC | PRN

## 2014-05-18 MED ORDER — ATENOLOL 25 MG PO TABS: ORAL_TABLET | ORAL | Status: DC

## 2014-05-18 MED ORDER — LANSOPRAZOLE 30 MG PO CPDR: 30.0000 mg | DELAYED_RELEASE_CAPSULE | Freq: Every day | ORAL | Status: DC

## 2014-05-18 MED ORDER — TRIAMCINOLONE ACETONIDE 0.1 % EX CREA: TOPICAL_CREAM | CUTANEOUS | Status: DC

## 2014-05-18 MED ORDER — FLUTICASONE PROPIONATE 50 MCG/ACT NA SUSP: NASAL | Status: DC

## 2014-05-18 NOTE — Addendum Note (Signed)
Addended by: HATCHER, JEFFREY C on: 05/18/2014 04:05 PM   Modules accepted: Orders

## 2014-05-18 NOTE — Assessment & Plan Note (Signed)
Will refill her benzodiazepine.

## 2014-05-18 NOTE — Assessment & Plan Note (Signed)
She is doing very well. Will have PAP in fall. She is offered/refused condoms. Hep B immune 2008. Will see her back in 6 months with labs prior.

## 2014-05-18 NOTE — Assessment & Plan Note (Signed)
Last Glc normal.  

## 2014-05-18 NOTE — Progress Notes (Signed)
   Subjective:    Patient ID: Sheri Davis, female    DOB: 10/20/1961, 53 y.o.   MRN: [id number]  HPI 53 yo F with HIV+ 2007, HTN (on atenolol), hyperlipidemia (off pravachol), depression.  Has been on atripla since dx. Denies missed ART. Previously taking herbal supplements (hawthorne berry, garlic, horsetail, mytaki/shitaki mushroom, astragulis root). Had elevated LFTs and advised to stop. Has been juicing and cleansing.   HIV 1 RNA Quant (copies/mL)  Date Value  04/30/2014 23*  09/29/2013 <20   04/02/2013 <20      CD4 T Cell Abs (/uL)  Date Value  04/30/2014 1140   09/29/2013 1420   04/02/2013 1160    Lab Results  Component Value Date   CHOL 274* 04/30/2014   HDL 64 04/30/2014   LDLCALC 782173* 04/30/2014   TRIG 185* 04/30/2014   CHOLHDL 4.3 04/30/2014     Has gained 6#, which she does not like.  Has been stressed from her neighbors being too loud.  Had colonoscopy 2014 (nl per pt).  Will have PAP in October, Mammo 04-2014 (-).   Review of Systems  Constitutional: Negative for appetite change and unexpected weight change.  Gastrointestinal: Negative for diarrhea and constipation.  Genitourinary: Negative for difficulty urinating.       Objective:   Physical Exam  Constitutional: She appears well-developed and well-nourished.  HENT:  Mouth/Throat: No oropharyngeal exudate.  Eyes: EOM are normal. Pupils are equal, round, and reactive to light.  Neck: Neck supple.  Cardiovascular: Normal rate, regular rhythm and normal heart sounds.   Pulmonary/Chest: Effort normal and breath sounds normal.  Abdominal: Soft. Bowel sounds are normal. She exhibits no distension. There is no tenderness.  Lymphadenopathy:    She has no cervical adenopathy.          Assessment & Plan:

## 2014-05-18 NOTE — Assessment & Plan Note (Signed)
She does not want to take statin. Will continue to monitor.

## 2014-05-18 NOTE — Assessment & Plan Note (Addendum)
Will get her into new PCP. Has been doing well.

## 2014-05-20 ENCOUNTER — Ambulatory Visit: Payer: Medicare PPO | Admitting: Infectious Diseases

## 2014-06-17 ENCOUNTER — Other Ambulatory Visit: Payer: Self-pay | Admitting: *Deleted

## 2014-06-17 DIAGNOSIS — J45909 Unspecified asthma, uncomplicated: Secondary | ICD-10-CM

## 2014-06-17 MED ORDER — CROMOLYN SODIUM 4 % OP SOLN: OPHTHALMIC | Status: DC

## 2014-06-18 ENCOUNTER — Other Ambulatory Visit: Payer: Self-pay | Admitting: *Deleted

## 2014-06-18 DIAGNOSIS — J45909 Unspecified asthma, uncomplicated: Secondary | ICD-10-CM

## 2014-06-18 MED ORDER — CROMOLYN SODIUM 4 % OP SOLN: OPHTHALMIC | Status: DC

## 2014-06-19 ENCOUNTER — Ambulatory Visit: Payer: Medicare PPO | Admitting: Family Medicine

## 2014-06-22 ENCOUNTER — Other Ambulatory Visit: Payer: Self-pay | Admitting: *Deleted

## 2014-06-22 DIAGNOSIS — J45909 Unspecified asthma, uncomplicated: Secondary | ICD-10-CM

## 2014-06-30 ENCOUNTER — Ambulatory Visit: Payer: Medicare PPO | Admitting: Family Medicine

## 2014-07-21 ENCOUNTER — Other Ambulatory Visit: Payer: Self-pay | Admitting: Licensed Clinical Social Worker

## 2014-07-21 DIAGNOSIS — J45909 Unspecified asthma, uncomplicated: Secondary | ICD-10-CM

## 2014-07-21 MED ORDER — CROMOLYN SODIUM 4 % OP SOLN: OPHTHALMIC | Status: DC

## 2014-07-28 ENCOUNTER — Other Ambulatory Visit: Payer: Self-pay | Admitting: Licensed Clinical Social Worker

## 2014-07-28 DIAGNOSIS — J45909 Unspecified asthma, uncomplicated: Secondary | ICD-10-CM

## 2014-07-28 MED ORDER — CROMOLYN SODIUM 4 % OP SOLN: OPHTHALMIC | Status: DC

## 2014-08-17 ENCOUNTER — Other Ambulatory Visit: Payer: Self-pay | Admitting: *Deleted

## 2014-08-17 DIAGNOSIS — I10 Essential (primary) hypertension: Secondary | ICD-10-CM

## 2014-08-17 MED ORDER — ATENOLOL 25 MG PO TABS: ORAL_TABLET | ORAL | Status: DC

## 2014-08-18 ENCOUNTER — Other Ambulatory Visit: Payer: Self-pay | Admitting: Infectious Diseases

## 2014-08-19 ENCOUNTER — Other Ambulatory Visit: Payer: Self-pay | Admitting: *Deleted

## 2014-08-19 DIAGNOSIS — I10 Essential (primary) hypertension: Secondary | ICD-10-CM

## 2014-08-19 MED ORDER — ATENOLOL 25 MG PO TABS: ORAL_TABLET | ORAL | Status: DC

## 2014-08-26 ENCOUNTER — Encounter: Payer: Self-pay | Admitting: Family Medicine

## 2014-08-26 ENCOUNTER — Ambulatory Visit (INDEPENDENT_AMBULATORY_CARE_PROVIDER_SITE_OTHER): Payer: Medicare PPO | Admitting: Family Medicine

## 2014-08-26 VITALS — BP 129/77 | HR 65 | Temp 98.6°F | Resp 16 | Ht 64.75 in | Wt 222.0 lb

## 2014-08-26 DIAGNOSIS — J45909 Unspecified asthma, uncomplicated: Secondary | ICD-10-CM

## 2014-08-26 DIAGNOSIS — H938X1 Other specified disorders of right ear: Secondary | ICD-10-CM

## 2014-08-26 DIAGNOSIS — F329 Major depressive disorder, single episode, unspecified: Secondary | ICD-10-CM

## 2014-08-26 DIAGNOSIS — E785 Hyperlipidemia, unspecified: Secondary | ICD-10-CM

## 2014-08-26 DIAGNOSIS — J302 Other seasonal allergic rhinitis: Secondary | ICD-10-CM

## 2014-08-26 DIAGNOSIS — B20 Human immunodeficiency virus [HIV] disease: Secondary | ICD-10-CM

## 2014-08-26 DIAGNOSIS — F3289 Other specified depressive episodes: Secondary | ICD-10-CM

## 2014-08-26 DIAGNOSIS — I10 Essential (primary) hypertension: Secondary | ICD-10-CM

## 2014-08-26 DIAGNOSIS — K219 Gastro-esophageal reflux disease without esophagitis: Secondary | ICD-10-CM

## 2014-08-26 DIAGNOSIS — J309 Allergic rhinitis, unspecified: Secondary | ICD-10-CM

## 2014-08-26 DIAGNOSIS — Z23 Encounter for immunization: Secondary | ICD-10-CM

## 2014-08-26 DIAGNOSIS — Z0000 Encounter for general adult medical examination without abnormal findings: Secondary | ICD-10-CM

## 2014-08-26 DIAGNOSIS — H938X9 Other specified disorders of ear, unspecified ear: Secondary | ICD-10-CM

## 2014-08-26 MED ORDER — CETIRIZINE HCL 10 MG PO TABS: 10.0000 mg | ORAL_TABLET | Freq: Every day | ORAL | Status: DC

## 2014-08-26 NOTE — Patient Instructions (Addendum)
Low carbohydrate, lowfat diet divided over 6 small meals Recommend 3 days cardiovascular  DASH Eating Plan DASH stands for "Dietary Approaches to Stop Hypertension." The DASH eating plan is a healthy eating plan that has been shown to reduce high blood pressure (hypertension). Additional health benefits may include reducing the risk of type 2 diabetes mellitus, heart disease, and stroke. The DASH eating plan may also help with weight loss. WHAT DO I NEED TO KNOW ABOUT THE DASH EATING PLAN? For the DASH eating plan, you will follow these general guidelines:  Choose foods with a percent daily value for sodium of less than 5% (as listed on the food label).  Use salt-free seasonings or herbs instead of table salt or sea salt.  Check with your health care provider or pharmacist before using salt substitutes.  Eat lower-sodium products, often labeled as "lower sodium" or "no salt added."  Eat fresh foods.  Eat more vegetables, fruits, and low-fat dairy products.  Choose whole grains. Look for the word "whole" as the first word in the ingredient list.  Choose fish and skinless chicken or [place] more often than red meat. Limit fish, poultry, and meat to 6 oz (170 g) each day.  Limit sweets, desserts, sugars, and sugary drinks.  Choose heart-healthy fats.  Limit cheese to 1 oz (28 g) per day.  Eat more home-cooked food and less restaurant, buffet, and fast food.  Limit fried foods.  Cook foods using methods other than frying.  Limit canned vegetables. If you do use them, rinse them well to decrease the sodium.  When eating at a restaurant, ask that your food be prepared with less salt, or no salt if possible. WHAT FOODS CAN I EAT? Seek help from a dietitian for individual calorie needs. Grains Whole grain or whole wheat bread. Brown rice. Whole grain or whole wheat pasta. Quinoa, bulgur, and whole grain cereals. Low-sodium cereals. Corn or whole wheat flour tortillas. Whole grain  cornbread. Whole grain crackers. Low-sodium crackers. Vegetables Fresh or frozen vegetables (raw, steamed, roasted, or grilled). Low-sodium or reduced-sodium tomato and vegetable juices. Low-sodium or reduced-sodium tomato sauce and paste. Low-sodium or reduced-sodium canned vegetables.  Fruits All fresh, canned (in natural juice), or frozen fruits. Meat and Other Protein Products Ground beef (85% or leaner), grass-fed beef, or beef trimmed of fat. Skinless chicken or [place]. Ground chicken or [place]. Pork trimmed of fat. All fish and seafood. Eggs. Dried beans, peas, or lentils. Unsalted nuts and seeds. Unsalted canned beans. Dairy Low-fat dairy products, such as skim or 1% milk, 2% or reduced-fat cheeses, low-fat ricotta or cottage cheese, or plain low-fat yogurt. Low-sodium or reduced-sodium cheeses. Fats and Oils Tub margarines without trans fats. Light or reduced-fat mayonnaise and salad dressings (reduced sodium). Avocado. Safflower, olive, or canola oils. Natural peanut or almond butter. Other Unsalted popcorn and pretzels. The items listed above may not be a complete list of recommended foods or beverages. Contact your dietitian for more options. WHAT FOODS ARE NOT RECOMMENDED? Grains White bread. White pasta. White rice. Refined cornbread. Bagels and croissants. Crackers that contain trans fat. Vegetables Creamed or fried vegetables. Vegetables in a cheese sauce. Regular canned vegetables. Regular canned tomato sauce and paste. Regular tomato and vegetable juices. Fruits Dried fruits. Canned fruit in light or heavy syrup. Fruit juice. Meat and Other Protein Products Fatty cuts of meat. Ribs, chicken wings, bacon, sausage, bologna, salami, chitterlings, fatback, hot dogs, bratwurst, and packaged luncheon meats. Salted nuts and seeds. Canned beans with salt. Dairy  Whole or 2% milk, cream, half-and-half, and cream cheese. Whole-fat or sweetened yogurt. Full-fat cheeses or blue cheese.  Nondairy creamers and whipped toppings. Processed cheese, cheese spreads, or cheese curds. Condiments Onion and garlic salt, seasoned salt, table salt, and sea salt. Canned and packaged gravies. Worcestershire sauce. Tartar sauce. Barbecue sauce. Teriyaki sauce. Soy sauce, including reduced sodium. Steak sauce. Fish sauce. Oyster sauce. Cocktail sauce. Horseradish. Ketchup and mustard. Meat flavorings and tenderizers. Bouillon cubes. Hot sauce. Tabasco sauce. Marinades. Taco seasonings. Relishes. Fats and Oils Butter, stick margarine, lard, shortening, ghee, and bacon fat. Coconut, palm kernel, or palm oils. Regular salad dressings. Other Pickles and olives. Salted popcorn and pretzels. The items listed above may not be a complete list of foods and beverages to avoid. Contact your dietitian for more information. WHERE CAN I FIND MORE INFORMATION? National Heart, Lung, and Blood Institute: [employer] Document Released: 11/02/2011 Document Revised: 03/30/2014 Document Reviewed: 09/17/2013 [facility] Patient Information 2015 [place], [place]. This information is not intended to replace advice given to you by your health care provider. Make sure you discuss any questions you have with your health care provider. Hypertension Hypertension, commonly called high blood pressure, is when the force of blood pumping through your arteries is too strong. Your arteries are the blood vessels that carry blood from your heart throughout your body. A blood pressure reading consists of a higher number over a lower number, such as 110/72. The higher number (systolic) is the pressure inside your arteries when your heart pumps. The lower number (diastolic) is the pressure inside your arteries when your heart relaxes. Ideally you want your blood pressure below 120/80. Hypertension forces your heart to work harder to pump blood. Your arteries may become narrow or stiff. Having hypertension  puts you at risk for heart disease, stroke, and other problems.  RISK FACTORS Some risk factors for high blood pressure are controllable. Others are not.  Risk factors you cannot control include:   Race. You may be at higher risk if you are African American.  Age. Risk increases with age.  Gender. Men are at higher risk than women before age 87 years. After age 4, women are at higher risk than men. Risk factors you can control include:  Not getting enough exercise or physical activity.  Being overweight.  Getting too much fat, sugar, calories, or salt in your diet.  Drinking too much alcohol. SIGNS AND SYMPTOMS Hypertension does not usually cause signs or symptoms. Extremely high blood pressure (hypertensive crisis) may cause headache, anxiety, shortness of breath, and nosebleed. DIAGNOSIS  To check if you have hypertension, your health care provider will measure your blood pressure while you are seated, with your arm held at the level of your heart. It should be measured at least twice using the same arm. Certain conditions can cause a difference in blood pressure between your right and left arms. A blood pressure reading that is higher than normal on one occasion does not mean that you need treatment. If one blood pressure reading is high, ask your health care provider about having it checked again. TREATMENT  Treating high blood pressure includes making lifestyle changes and possibly taking medicine. Living a healthy lifestyle can help lower high blood pressure. You may need to change some of your habits. Lifestyle changes may include:  Following the DASH diet. This diet is high in fruits, vegetables, and whole grains. It is low in salt, red meat, and added sugars.  Getting at least 2 hours of brisk  physical activity every week.  Losing weight if necessary.  Not smoking.  Limiting alcoholic beverages.  Learning ways to reduce stress. If lifestyle changes are not enough to  get your blood pressure under control, your health care provider may prescribe medicine. You may need to take more than one. Work closely with your health care provider to understand the risks and benefits. HOME CARE INSTRUCTIONS  Have your blood pressure rechecked as directed by your health care provider.   Take medicines only as directed by your health care provider. Follow the directions carefully. Blood pressure medicines must be taken as prescribed. The medicine does not work as well when you skip doses. Skipping doses also puts you at risk for problems.   Do not smoke.   Monitor your blood pressure at home as directed by your health care provider. SEEK MEDICAL CARE IF:   You think you are having a reaction to medicines taken.  You have recurrent headaches or feel dizzy.  You have swelling in your ankles.  You have trouble with your vision. SEEK IMMEDIATE MEDICAL CARE IF:  You develop a severe headache or confusion.  You have unusual weakness, numbness, or feel faint.  You have severe chest or abdominal pain.  You vomit repeatedly.  You have trouble breathing. MAKE SURE YOU:   Understand these instructions.  Will watch your condition.  Will get help right away if you are not doing well or get worse. Document Released: 11/13/2005 Document Revised: 03/30/2014 Document Reviewed: 09/05/2013 [facility] Patient Information 2015 [place]. This information is not intended to replace advice given to you by your health care provider. Make sure you discuss any questions you have with your health care provider.

## 2014-08-26 NOTE — Progress Notes (Signed)
Subjective:    Patient ID: Sheri Davis, female    DOB: 1961-01-07, 53 y.o.   MRN: [id number]  HPI Sheri Davis  presents to establish care. She states that she has been followed by Sheri Davis for HIV since Apr 13, 2005. She states that she feels well and consistently takes medications. Her last appointment was in June and she is typically followed every 6 months.  She denies fever, fatigue, skin changes, nausea, vomiting, or diarrhea.   Patient here for follow-up of elevated blood pressure. She is not exercising and is not adherent to low salt diet.  Blood pressure is well controlled at home.  Patient denies chest pain, dyspnea, fatigue, lower extremity edema, near-syncope, palpitations and syncope.  Cardiovascular risk factors: hypertension, obesity (BMI >= 30 kg/m2) and sedentary lifestyle.   Past Medical History  Diagnosis Date  . HIV infection   . Hypertension   . Hyperlipidemia   . GERD (gastroesophageal reflux disease)   . Asthma       Review of Systems  Constitutional: Negative for fatigue.  HENT: Positive for sinus pressure. Negative for ear discharge.   Eyes: Negative.   Respiratory: Negative.   Cardiovascular: Negative.   Gastrointestinal: Negative.   Musculoskeletal: Negative.   Allergic/Immunologic: Negative.   Neurological: Negative.   Hematological: Negative.   Psychiatric/Behavioral: Negative.        Objective:   Physical Exam  Constitutional: She is oriented to person, place, and time. She appears well-developed and well-nourished.  HENT:  Head: Normocephalic and atraumatic.  Right Ear: Hearing and external ear normal. No drainage. A middle ear effusion is present. No decreased hearing is noted.  Left Ear: External ear normal. No drainage. No decreased hearing is noted.  Mouth/Throat: Oropharynx is clear and moist.  Right TM-Dull gray with minimal fluid   Eyes: Conjunctivae are normal. Pupils are equal, round, and reactive to light.  Neck: Normal  range of motion. Neck supple.  Cardiovascular: Normal rate, regular rhythm and normal heart sounds.   Abdominal: Soft. Bowel sounds are normal.  Musculoskeletal: Normal range of motion.  Neurological: She is alert and oriented to person, place, and time.  Skin: Skin is warm and dry.  Psychiatric: She has a normal mood and affect. Her behavior is normal. Judgment and thought content normal.         BP 129/77  Pulse 65  Temp(Src) 98.6 F (37 C) (Oral)  Resp 16  Ht 5' 4.75" (1.645 m)  Wt 222 lb (100.699 kg)  BMI 37.21 kg/m2 Assessment & Plan:   1. HIV DISEASE Stable on current medication regimen. She states that is followed every 6 months.    2. Seasonal allergies - cetirizine (ZYRTEC) 10 MG tablet; Take 1 tablet (10 mg total) by mouth daily.  Dispense: 30 tablet; Refill: 2  3. Ear pressure, right  - cetirizine (ZYRTEC) 10 MG tablet; Take 1 tablet (10 mg total) by mouth daily.  Dispense: 30 tablet; Refill: 2 4. Hypertension Stable on current medication regimen  5. Gastroesophageal reflux disease without esophagitis -Stable on current medication regimen  6. DEPRESSION Stable; She states that she typically gets depressed in the month of October due to the fact that her mother passed away in Apr 13, 2005. Also, niece was killed in October.   7. ASTHMA Stable; She states that she had to use albuterol inhaler 2 weeks ago. She had a sinus infection.   8. HYPERLIPIDEMIA Reviewed lipid panel from June.   9. Need for immunization against influenza  -  Flu Vaccine QUAD 36+ mos IM (Fluarix)  10. Need for Tdap vaccination  - Tdap vaccine greater than or equal to 7yo IM   11. Annual physical exam  - TSH; Future - Hemoglobin A1c; Future - COMPLETE METABOLIC PANEL WITH GFR; Future - CBC with Differential; Future - Lipid Panel; Future   Mammogram up to date Pap smear: November 2014 Last eye exam: March 2015 Last colonoscopy: 1 year ago RTC: CPE w/pap with Sheri Davis.

## 2014-09-29 ENCOUNTER — Other Ambulatory Visit: Payer: Medicare PPO

## 2014-10-05 ENCOUNTER — Other Ambulatory Visit: Payer: Self-pay | Admitting: Infectious Diseases

## 2014-10-05 ENCOUNTER — Ambulatory Visit: Payer: Medicare PPO | Admitting: Internal Medicine

## 2014-10-07 ENCOUNTER — Other Ambulatory Visit: Payer: Medicare PPO

## 2014-10-09 ENCOUNTER — Other Ambulatory Visit (INDEPENDENT_AMBULATORY_CARE_PROVIDER_SITE_OTHER): Payer: Medicare PPO

## 2014-10-09 DIAGNOSIS — E785 Hyperlipidemia, unspecified: Secondary | ICD-10-CM

## 2014-10-09 LAB — CBC WITH DIFFERENTIAL/PLATELET
BASOS ABS: 0 10*3/uL (ref 0.0–0.1)
Basophils Relative: 0 % (ref 0–1)
Eosinophils Absolute: 0.2 10*3/uL (ref 0.0–0.7)
Eosinophils Relative: 3 % (ref 0–5)
HEMATOCRIT: 38.7 % (ref 36.0–46.0)
Hemoglobin: 13.2 g/dL (ref 12.0–15.0)
LYMPHS PCT: 52 % — AB (ref 12–46)
Lymphs Abs: 2.8 10*3/uL (ref 0.7–4.0)
MCH: 31.6 pg (ref 26.0–34.0)
MCHC: 34.1 g/dL (ref 30.0–36.0)
MCV: 92.6 fL (ref 78.0–100.0)
Monocytes Absolute: 0.3 10*3/uL (ref 0.1–1.0)
Monocytes Relative: 6 % (ref 3–12)
NEUTROS ABS: 2.1 10*3/uL (ref 1.7–7.7)
Neutrophils Relative %: 39 % — ABNORMAL LOW (ref 43–77)
PLATELETS: 320 10*3/uL (ref 150–400)
RBC: 4.18 MIL/uL (ref 3.87–5.11)
RDW: 13.2 % (ref 11.5–15.5)
WBC: 5.3 10*3/uL (ref 4.0–10.5)

## 2014-10-09 LAB — TSH: TSH: 0.469 u[IU]/mL (ref 0.350–4.500)

## 2014-10-10 LAB — COMPLETE METABOLIC PANEL WITH GFR
ALBUMIN: 4.3 g/dL (ref 3.5–5.2)
ALK PHOS: 68 U/L (ref 39–117)
ALT: 24 U/L (ref 0–35)
AST: 22 U/L (ref 0–37)
BUN: 16 mg/dL (ref 6–23)
CALCIUM: 9.3 mg/dL (ref 8.4–10.5)
CO2: 20 mEq/L (ref 19–32)
Chloride: 105 mEq/L (ref 96–112)
Creat: 0.64 mg/dL (ref 0.50–1.10)
GFR, Est African American: >89 mL/min
GLUCOSE: 79 mg/dL (ref 70–99)
POTASSIUM: 4.4 meq/L (ref 3.5–5.3)
Sodium: 138 mEq/L (ref 135–145)
Total Bilirubin: 0.3 mg/dL (ref 0.2–1.2)
Total Protein: 7 g/dL (ref 6.0–8.3)

## 2014-10-10 LAB — HEMOGLOBIN A1C
Hgb A1c MFr Bld: 6.5 % — ABNORMAL HIGH (ref <5.7)
Mean Plasma Glucose: 140 mg/dL — ABNORMAL HIGH (ref <117)

## 2014-10-10 LAB — LIPID PANEL
Cholesterol: 266 mg/dL — ABNORMAL HIGH (ref 0–200)
HDL: 74 mg/dL (ref >39)
LDL Cholesterol: 175 mg/dL — ABNORMAL HIGH (ref 0–99)
TRIGLYCERIDES: 86 mg/dL (ref <150)
Total CHOL/HDL Ratio: 3.6 Ratio
VLDL: 17 mg/dL (ref 0–40)

## 2014-10-13 ENCOUNTER — Other Ambulatory Visit: Payer: Self-pay | Admitting: Infectious Diseases

## 2014-10-13 ENCOUNTER — Ambulatory Visit: Payer: Medicare PPO | Admitting: Family Medicine

## 2014-10-16 ENCOUNTER — Encounter: Payer: Self-pay | Admitting: Family Medicine

## 2014-10-16 ENCOUNTER — Ambulatory Visit (INDEPENDENT_AMBULATORY_CARE_PROVIDER_SITE_OTHER): Payer: Medicare PPO | Admitting: Family Medicine

## 2014-10-16 ENCOUNTER — Ambulatory Visit (HOSPITAL_COMMUNITY)
Admission: RE | Admit: 2014-10-16 | Discharge: 2014-10-16 | Disposition: A | Discharge status: Home / Self Care | Payer: Medicare PPO | Source: Ambulatory Visit | Attending: Family Medicine | Admitting: Family Medicine

## 2014-10-16 VITALS — BP 136/90 | HR 62 | Temp 98.1°F | Resp 16 | Ht 64.5 in | Wt 219.0 lb

## 2014-10-16 DIAGNOSIS — M25431 Effusion, right wrist: Secondary | ICD-10-CM | POA: Insufficient documentation

## 2014-10-16 DIAGNOSIS — Y92009 Unspecified place in unspecified non-institutional (private) residence as the place of occurrence of the external cause: Secondary | ICD-10-CM | POA: Insufficient documentation

## 2014-10-16 DIAGNOSIS — R7309 Other abnormal glucose: Secondary | ICD-10-CM

## 2014-10-16 DIAGNOSIS — E8881 Metabolic syndrome: Secondary | ICD-10-CM

## 2014-10-16 DIAGNOSIS — E785 Hyperlipidemia, unspecified: Secondary | ICD-10-CM

## 2014-10-16 DIAGNOSIS — M25531 Pain in right wrist: Secondary | ICD-10-CM | POA: Insufficient documentation

## 2014-10-16 DIAGNOSIS — S6991XA Unspecified injury of right wrist, hand and finger(s), initial encounter: Secondary | ICD-10-CM

## 2014-10-16 DIAGNOSIS — Z0000 Encounter for general adult medical examination without abnormal findings: Secondary | ICD-10-CM

## 2014-10-16 DIAGNOSIS — F411 Generalized anxiety disorder: Secondary | ICD-10-CM

## 2014-10-16 DIAGNOSIS — Z8709 Personal history of other diseases of the respiratory system: Secondary | ICD-10-CM

## 2014-10-16 DIAGNOSIS — W010XXA Fall on same level from slipping, tripping and stumbling without subsequent striking against object, initial encounter: Secondary | ICD-10-CM | POA: Insufficient documentation

## 2014-10-16 DIAGNOSIS — F4310 Post-traumatic stress disorder, unspecified: Secondary | ICD-10-CM

## 2014-10-16 DIAGNOSIS — B20 Human immunodeficiency virus [HIV] disease: Secondary | ICD-10-CM

## 2014-10-16 DIAGNOSIS — I10 Essential (primary) hypertension: Secondary | ICD-10-CM

## 2014-10-16 DIAGNOSIS — R7303 Prediabetes: Secondary | ICD-10-CM

## 2014-10-16 MED ORDER — LORAZEPAM 1 MG PO TABS: ORAL_TABLET | ORAL | Status: DC

## 2014-10-16 NOTE — Patient Instructions (Addendum)
Diabetes Mellitus and Food It is important for you to manage your blood sugar (glucose) level. Your blood glucose level can be greatly affected by what you eat. Eating healthier foods in the appropriate amounts throughout the day at about the same time each day will help you control your blood glucose level. It can also help slow or prevent worsening of your diabetes mellitus. Healthy eating may even help you improve the level of your blood pressure and reach or maintain a healthy weight.  HOW CAN FOOD AFFECT ME? Carbohydrates Carbohydrates affect your blood glucose level more than any other type of food. Your dietitian will help you determine how many carbohydrates to eat at each meal and teach you how to count carbohydrates. Counting carbohydrates is important to keep your blood glucose at a healthy level, especially if you are using insulin or taking certain medicines for diabetes mellitus. Alcohol Alcohol can cause sudden decreases in blood glucose (hypoglycemia), especially if you use insulin or take certain medicines for diabetes mellitus. Hypoglycemia can be a life-threatening condition. Symptoms of hypoglycemia (sleepiness, dizziness, and disorientation) are similar to symptoms of having too much alcohol.  If your health care provider has given you approval to drink alcohol, do so in moderation and use the following guidelines:  Women should not have more than one drink per day, and men should not have more than two drinks per day. One drink is equal to:  12 oz of beer.  5 oz of wine.  1 oz of hard liquor.  Do not drink on an empty stomach.  Keep yourself hydrated. Have water, diet soda, or unsweetened iced tea.  Regular soda, juice, and other mixers might contain a lot of carbohydrates and should be counted. WHAT FOODS ARE NOT RECOMMENDED? As you make food choices, it is important to remember that all foods are not the same. Some foods have fewer nutrients per serving than other  foods, even though they might have the same number of calories or carbohydrates. It is difficult to get your body what it needs when you eat foods with fewer nutrients. Examples of foods that you should avoid that are high in calories and carbohydrates but low in nutrients include:  Trans fats (most processed foods list trans fats on the Nutrition Facts label).  Regular soda.  Juice.  Candy.  Sweets, such as cake, pie, doughnuts, and cookies.  Fried foods. WHAT FOODS CAN I EAT? Have nutrient-rich foods, which will nourish your body and keep you healthy. The food you should eat also will depend on several factors, including:  The calories you need.  The medicines you take.  Your weight.  Your blood glucose level.  Your blood pressure level.  Your cholesterol level. You also should eat a variety of foods, including:  Protein, such as meat, poultry, fish, tofu, nuts, and seeds (lean animal proteins are best).  Fruits.  Vegetables.  Dairy products, such as milk, cheese, and yogurt (low fat is best).  Breads, grains, pasta, cereal, rice, and beans.  Fats such as olive oil, trans fat-free margarine, canola oil, avocado, and olives. DOES EVERYONE WITH DIABETES MELLITUS HAVE THE SAME MEAL PLAN? Because every person with diabetes mellitus is different, there is not one meal plan that works for everyone. It is very important that you meet with a dietitian who will help you create a meal plan that is just right for you. Document Released: 08/10/2005 Document Revised: 11/18/2013 Document Reviewed: 10/10/2013 ExitCare Patient Information 2015 [employer]. This   information is not intended to replace advice given to you by your health care provider. Make sure you discuss any questions you have with your health care provider. Basic Carbohydrate Counting for Diabetes Mellitus Carbohydrate counting is a method for keeping track of the amount of carbohydrates you eat. Eating  carbohydrates naturally increases the level of sugar (glucose) in your blood, so it is important for you to know the amount that is okay for you to have in every meal. Carbohydrate counting helps keep the level of glucose in your blood within normal limits. The amount of carbohydrates allowed is different for every person. A dietitian can help you calculate the amount that is right for you. Once you know the amount of carbohydrates you can have, you can count the carbohydrates in the foods you want to eat. Carbohydrates are found in the following foods:  Grains, such as breads and cereals.  Dried beans and soy products.  Starchy vegetables, such as potatoes, peas, and corn.  Fruit and fruit juices.  Milk and yogurt.  Sweets and snack foods, such as cake, cookies, candy, chips, soft drinks, and fruit drinks. CARBOHYDRATE COUNTING There are two ways to count the carbohydrates in your food. You can use either of the methods or a combination of both. Reading the "Nutrition Facts" on Packaged Food The "Nutrition Facts" is an area that is included on the labels of almost all packaged food and beverages in the [place]. It includes the serving size of that food or beverage and information about the nutrients in each serving of the food, including the grams (g) of carbohydrate per serving.  Decide the number of servings of this food or beverage that you will be able to eat or drink. Multiply that number of servings by the number of grams of carbohydrate that is listed on the label for that serving. The total will be the amount of carbohydrates you will be having when you eat or drink this food or beverage. Learning Standard Serving Sizes of Food When you eat food that is not packaged or does not include "Nutrition Facts" on the label, you need to measure the servings in order to count the amount of carbohydrates.A serving of most carbohydrate-rich foods contains about 15 g of carbohydrates. The  following list includes serving sizes of carbohydrate-rich foods that provide 15 g ofcarbohydrate per serving:   1 slice of bread (1 oz) or 1 six-inch tortilla.    of a hamburger bun or English muffin.  4-6 crackers.   cup unsweetened dry cereal.    cup hot cereal.   cup rice or pasta.    cup mashed potatoes or  of a large baked potato.  1 cup fresh fruit or one small piece of fruit.    cup canned or frozen fruit or fruit juice.  1 cup milk.   cup plain fat-free yogurt or yogurt sweetened with artificial sweeteners.   cup cooked dried beans or starchy vegetable, such as peas, corn, or potatoes.  Decide the number of standard-size servings that you will eat. Multiply that number of servings by 15 (the grams of carbohydrates in that serving). For example, if you eat 2 cups of strawberries, you will have eaten 2 servings and 30 g of carbohydrates (2 servings x 15 g = 30 g). For foods such as soups and casseroles, in which more than one food is mixed in, you will need to count the carbohydrates in each food that is included. EXAMPLE OF CARBOHYDRATE   COUNTING Sample Dinner  3 oz chicken breast.   cup of brown rice.   cup of corn.  1 cup milk.   1 cup strawberries with sugar-free whipped topping.  Carbohydrate Calculation Step 1: Identify the foods that contain carbohydrates:   Rice.   Corn.   Milk.   Strawberries. Step 2:Calculate the number of servings eaten of each:   2 servings of rice.   1 serving of corn.   1 serving of milk.   1 serving of strawberries. Step 3: Multiply each of those number of servings by 15 g:   2 servings of rice x 15 g = 30 g.   1 serving of corn x 15 g = 15 g.   1 serving of milk x 15 g = 15 g.   1 serving of strawberries x 15 g = 15 g. Step 4: Add together all of the amounts to find the total grams of carbohydrates eaten: 30 g + 15 g + 15 g + 15 g = 75 g. Document Released: 11/13/2005 Document  Revised: 03/30/2014 Document Reviewed: 10/10/2013 ExitCare Patient Information 2015 [employer]. This information is not intended to replace advice given to you by your health care provider. Make sure you discuss any questions you have with your health care provider.  

## 2014-10-16 NOTE — Progress Notes (Signed)
Subjective:    Patient ID: Sheri Davis, female    DOB: 09/13/1961, 53 y.o.   MRN: [id number]  HPI [name] is a patient with a history of HIV and hypertension that presents with a complete physical examination. She reports that she feels well and has minimal complaints. She states that she has been taking medications consistently. She is not sexually active and has had a recent pap smear. Patient denies post-menopausal vaginal bleeding.  The patient wears seatbelts. She does not have regular exercise.  Patient also complains of right wrist pain. This is evaluated as a personal injury. Patient reports that she fell on right wrist 4 days ago, while attempting to break a fall. The pain began 4 days ago.  She describes as 3-4/10 and  the symptoms as intermittent aching. Symptoms improve with rest and compression. The symptoms are worse with use.  Past Medical History  Diagnosis Date  . HIV infection   . Hypertension   . Hyperlipidemia   . GERD (gastroesophageal reflux disease)   . Asthma     .Review of Systems  Constitutional: Negative for fever and fatigue.  HENT: Negative.   Eyes: Negative.   Respiratory: Negative.  Negative for shortness of breath and wheezing.   Cardiovascular: Negative.   Gastrointestinal: Negative.   Endocrine: Negative.   Genitourinary: Negative.   Musculoskeletal: Positive for myalgias (right wrist pain).  Skin: Negative.   Allergic/Immunologic: Positive for environmental allergies.  Neurological: Negative.   Hematological: Negative.   Psychiatric/Behavioral: The patient is nervous/anxious (Patient reports that she gets anxious with new situations ).        Objective:   Physical Exam  Constitutional: She is oriented to person, place, and time. She appears well-developed and well-nourished. She appears distressed.  HENT:  Head: Normocephalic and atraumatic.  Right Ear: External ear normal.  Eyes: Conjunctivae are normal. Pupils are  equal, round, and reactive to light.  Neck: Normal range of motion. Neck supple.  Cardiovascular: Normal rate, regular rhythm, normal heart sounds and intact distal pulses.   No murmur heard. Pulmonary/Chest: Effort normal and breath sounds normal.  Abdominal: Soft. Bowel sounds are normal.  Musculoskeletal:       Right wrist: She exhibits decreased range of motion, tenderness and swelling. She exhibits no bony tenderness.  Neurological: She is alert and oriented to person, place, and time. She has normal reflexes.  Skin: Skin is warm and dry. She is not diaphoretic.  Psychiatric: She has a normal mood and affect. Her behavior is normal. Judgment and thought content normal.         BP 136/90 mmHg  Pulse 62  Temp(Src) 98.1 F (36.7 C) (Oral)  Resp 16  Ht 5' 4.5" (1.638 m)  Wt 219 lb (99.338 kg)  BMI 37.02 kg/m2 Assessment & Plan:    1. Annual physical exam Patient is up to date on vaccinations. I reviewed last mammogram from 04/30/2014. I recommend a bone density test during next mammogram. She is also up to date on pap smear. We reviewed last laboratory results.   2. Right wrist pain She complains of aching to right wrist after sustaining a fall. Recommend ice pack for 20 minutes 4 times daily, rest, and continue to wrap in compression bandage.  - DG Wrist Complete Right; Future  3. Right wrist injury, initial encounter  - DG Wrist Complete Right; Future  4. HIV Stable; she reports that she continues to take medications consistently. She has an appointment scheduled with  Dr. Ninetta LightsHatcher in 3 weeks.   5. Hypertension Stable on current medication regimen  6. DISORDER, POSTTRAUMATIC STRESS She reports that she has been dealing with the loss of her mother and niece. She has a strong support system to help. She denies suicidal or homicidal ideations. I recommend counseling, patient states that she would prefer not to go to counseling at this time. She reports that she will continue  to rely on her support system.   7. History of asthma She reports that it has been several months since she has had to utilize albuterol inhaler.   8. Anxiety state She reports that her anxiety is controlled with Ativan as needed. She states that she occasionally gets anxious when she spends too much time focusing on her diagnosis. She also states that she becomes anxious when approaching new situations. I will continue Ativan.  - LORazepam (ATIVAN) 1 MG tablet; ONE TABLET EVERY 8 HOURS AS NEEDED  Dispense: 30 tablet; Refill: 1   9. Metabolic syndrome Reviewed current hemoglobin A1C and lipid panel. Patient reports that she does not exercise on a regular basis and eats sweets. Patient needs more diet education.   10. Prediabetes Current hemoglobin A1C is 6.5. Recommend an 1800 calorie low fat, low carbohydrate diet divided over 6 small meals, Patient was given written information pertaining to diet, portion sizes, and low carbohydrates.   11. Dyslipidemia Reviewed lipid panel.  Recommend dietary modification, , exercise, and weight control. ASCVD risk is 8.71%.     RTC: Return to clinic in 3 months  Emelee Rodocker M, FNP

## 2014-11-04 ENCOUNTER — Other Ambulatory Visit: Payer: Medicare PPO

## 2014-11-05 ENCOUNTER — Other Ambulatory Visit: Payer: Medicare PPO

## 2014-11-05 DIAGNOSIS — B20 Human immunodeficiency virus [HIV] disease: Secondary | ICD-10-CM

## 2014-11-05 LAB — COMPREHENSIVE METABOLIC PANEL
ALBUMIN: 4.3 g/dL (ref 3.5–5.2)
ALK PHOS: 89 U/L (ref 39–117)
ALT: 31 U/L (ref 0–35)
AST: 27 U/L (ref 0–37)
BILIRUBIN TOTAL: 0.2 mg/dL (ref 0.2–1.2)
BUN: 13 mg/dL (ref 6–23)
CO2: 26 mEq/L (ref 19–32)
Calcium: 9.2 mg/dL (ref 8.4–10.5)
Chloride: 103 mEq/L (ref 96–112)
Creat: 0.66 mg/dL (ref 0.50–1.10)
Glucose, Bld: 90 mg/dL (ref 70–99)
POTASSIUM: 4.9 meq/L (ref 3.5–5.3)
Sodium: 137 mEq/L (ref 135–145)
Total Protein: 6.8 g/dL (ref 6.0–8.3)

## 2014-11-05 LAB — CBC
HCT: 38.6 % (ref 36.0–46.0)
Hemoglobin: 13.5 g/dL (ref 12.0–15.0)
MCH: 31.6 pg (ref 26.0–34.0)
MCHC: 35 g/dL (ref 30.0–36.0)
MCV: 90.4 fL (ref 78.0–100.0)
MPV: 9.9 fL (ref 9.4–12.4)
PLATELETS: 334 10*3/uL (ref 150–400)
RBC: 4.27 MIL/uL (ref 3.87–5.11)
RDW: 13.4 % (ref 11.5–15.5)
WBC: 6.3 10*3/uL (ref 4.0–10.5)

## 2014-11-06 LAB — T-HELPER CELL (CD4) - (RCID CLINIC ONLY)
CD4 % Helper T Cell: 42 % (ref 33–55)
CD4 T Cell Abs: 970 /uL (ref 400–2700)

## 2014-11-06 LAB — HIV-1 RNA QUANT-NO REFLEX-BLD
HIV 1 RNA Quant: 30 copies/mL — ABNORMAL HIGH (ref <20)
HIV-1 RNA Quant, Log: 1.48 {Log} — ABNORMAL HIGH (ref <1.30)

## 2014-11-17 ENCOUNTER — Encounter: Payer: Self-pay | Admitting: *Deleted

## 2014-11-17 ENCOUNTER — Other Ambulatory Visit: Payer: Self-pay | Admitting: *Deleted

## 2014-11-17 ENCOUNTER — Ambulatory Visit (INDEPENDENT_AMBULATORY_CARE_PROVIDER_SITE_OTHER): Payer: Medicare PPO | Admitting: Infectious Diseases

## 2014-11-17 ENCOUNTER — Encounter: Payer: Self-pay | Admitting: Infectious Diseases

## 2014-11-17 VITALS — BP 132/92 | HR 60 | Temp 98.4°F | Wt 224.0 lb

## 2014-11-17 DIAGNOSIS — B20 Human immunodeficiency virus [HIV] disease: Secondary | ICD-10-CM

## 2014-11-17 DIAGNOSIS — F32A Depression, unspecified: Secondary | ICD-10-CM

## 2014-11-17 DIAGNOSIS — I10 Essential (primary) hypertension: Secondary | ICD-10-CM

## 2014-11-17 DIAGNOSIS — F329 Major depressive disorder, single episode, unspecified: Secondary | ICD-10-CM

## 2014-11-17 DIAGNOSIS — Z113 Encounter for screening for infections with a predominantly sexual mode of transmission: Secondary | ICD-10-CM

## 2014-11-17 DIAGNOSIS — Z79899 Other long term (current) drug therapy: Secondary | ICD-10-CM

## 2014-11-17 MED ORDER — EFAVIRENZ-EMTRICITAB-TENOFOVIR 600-200-300 MG PO TABS: ORAL_TABLET | ORAL | Status: DC

## 2014-11-17 NOTE — Assessment & Plan Note (Signed)
Well controlled today.

## 2014-11-17 NOTE — Assessment & Plan Note (Signed)
She is stressed by her life situations. Will have her seen by [facility]. Could consider having her get plugged in with [facility] as she has no support.

## 2014-11-17 NOTE — Progress Notes (Signed)
   Subjective:    Patient ID: Sheri Davis, female    DOB: 10/16/1961, 53 y.o.   MRN: [id number]  HPI 53 yo F with HIV+ 2007, HTN (on atenolol), hyperlipidemia (off pravachol), depression.  Has been on atripla since dx.  Has been feeling well. No missed ART.  Mood has been "ok", trying to stay positive.   HIV 1 RNA QUANT (copies/mL)  Date Value  11/05/2014 30*  04/30/2014 23*  09/29/2013 <20   CD4 T CELL ABS (/uL)  Date Value  11/05/2014 970  04/30/2014 1140  09/29/2013 1420   Lab Results  Component Value Date   CHOL 266* 10/09/2014   HDL 74 10/09/2014   LDLCALC 175* 10/09/2014   TRIG 86 10/09/2014   CHOLHDL 3.6 10/09/2014    Attributes her VL and CD4 to stress. Family, life.    Review of Systems  Constitutional: Negative for appetite change and unexpected weight change.  Gastrointestinal: Negative for diarrhea and constipation.  Genitourinary: Negative for difficulty urinating.  Psychiatric/Behavioral: Negative for sleep disturbance. The patient is nervous/anxious.        Objective:   Physical Exam  Constitutional: She appears well-developed and well-nourished.  HENT:  Mouth/Throat: No oropharyngeal exudate.  Eyes: EOM are normal. Pupils are equal, round, and reactive to light.  Neck: Neck supple.  Cardiovascular: Normal rate, regular rhythm and normal heart sounds.   Pulmonary/Chest: Effort normal and breath sounds normal.  Abdominal: Soft. Bowel sounds are normal. She exhibits no distension. There is no tenderness.  Lymphadenopathy:    She has no cervical adenopathy.  Psychiatric: Her behavior is normal. Thought content normal. Her mood appears not anxious. Her affect is blunt. Her affect is not labile and not inappropriate. Her speech is not rapid and/or pressured. She does not exhibit a depressed mood.          Assessment & Plan:

## 2014-11-17 NOTE — Assessment & Plan Note (Signed)
She is doing ok, her CD4 is down but still well into normal range. She is offered/refuses condoms  Will see her back in 6 months. Hep B immune. Flu and pnvx up to date.

## 2014-11-26 ENCOUNTER — Other Ambulatory Visit: Payer: Self-pay | Admitting: Infectious Diseases

## 2014-12-02 ENCOUNTER — Ambulatory Visit: Payer: Medicare PPO | Admitting: Dietician

## 2014-12-09 ENCOUNTER — Ambulatory Visit: Payer: Medicare PPO

## 2015-01-01 ENCOUNTER — Ambulatory Visit: Payer: Medicare PPO

## 2015-01-05 ENCOUNTER — Telehealth: Payer: Self-pay | Admitting: Internal Medicine

## 2015-01-05 DIAGNOSIS — F411 Generalized anxiety disorder: Secondary | ICD-10-CM

## 2015-01-06 MED ORDER — LORAZEPAM 1 MG PO TABS: ORAL_TABLET | ORAL | Status: DC

## 2015-01-06 NOTE — Telephone Encounter (Signed)
Meds ordered this encounter  Medications  . LORazepam (ATIVAN) 1 MG tablet    Sig: ONE TABLET EVERY 8 HOURS AS NEEDED    Dispense:  30 tablet    Refill:  0    Order Specific Question:  Supervising Provider    Answer:  Marthann SchillerMATTHEWS, MICHELLE A [[id number]]  Reviewed [place] Substance Reporting system prior to reorder Massie MaroonHollis,Karizma Cheek M, FNP

## 2015-01-06 NOTE — Telephone Encounter (Signed)
Refill request for ativan 1mg . LOV 10/16/2014. Please advise. Thanks!

## 2015-01-08 ENCOUNTER — Ambulatory Visit: Payer: Medicare PPO

## 2015-01-15 ENCOUNTER — Ambulatory Visit: Payer: Medicare PPO

## 2015-01-18 ENCOUNTER — Ambulatory Visit: Payer: Medicare PPO | Admitting: Family Medicine

## 2015-01-19 ENCOUNTER — Other Ambulatory Visit: Payer: Self-pay | Admitting: Infectious Diseases

## 2015-01-28 ENCOUNTER — Ambulatory Visit: Payer: Medicare PPO | Admitting: Family Medicine

## 2015-01-29 ENCOUNTER — Other Ambulatory Visit (HOSPITAL_COMMUNITY)
Admission: RE | Admit: 2015-01-29 | Discharge: 2015-01-29 | Disposition: A | Discharge status: Home / Self Care | Payer: Medicare PPO | Source: Ambulatory Visit | Attending: Infectious Diseases | Admitting: Infectious Diseases

## 2015-01-29 ENCOUNTER — Ambulatory Visit (INDEPENDENT_AMBULATORY_CARE_PROVIDER_SITE_OTHER): Payer: Medicare PPO | Admitting: *Deleted

## 2015-01-29 ENCOUNTER — Ambulatory Visit: Payer: Medicare PPO | Admitting: Family Medicine

## 2015-01-29 DIAGNOSIS — Z124 Encounter for screening for malignant neoplasm of cervix: Secondary | ICD-10-CM

## 2015-01-29 DIAGNOSIS — B20 Human immunodeficiency virus [HIV] disease: Secondary | ICD-10-CM | POA: Diagnosis not present

## 2015-01-29 NOTE — Patient Instructions (Signed)
Pt needs to return on Monday, March 7 to have TB skin test read.  Pap smear results will be ready in about a week.  I will mail them to you.  Thank you for coming to the [facility] for your care.  Angelique Blonderenise, RN

## 2015-01-29 NOTE — Progress Notes (Signed)
  Subjective:     Sheri Davis is a 54 y.o. woman who comes in today for a  pap smear only.  Previous abnormal Pap smears: no. Contraception: condoms.  Whitish vaginal discharge.  No symptoms.  Pt is not currently having intercourse.   Objective:    There were no vitals taken for this visit. Pelvic Exam:  Pap smear obtained.   Assessment:    Screening pap smear.   Plan:    Follow up in one year, or as indicated by Pap results.  Pt given educational materials re: HIV and women, self-esteem, BSE, nutrition and diet management, PAP smears and partner safety. Pt given condoms.

## 2015-02-01 ENCOUNTER — Ambulatory Visit: Payer: Medicare PPO | Admitting: *Deleted

## 2015-02-01 LAB — TB SKIN TEST: TB Skin Test: NEGATIVE

## 2015-02-02 LAB — CYTOLOGY - PAP

## 2015-02-04 ENCOUNTER — Encounter: Payer: Self-pay | Admitting: *Deleted

## 2015-02-05 ENCOUNTER — Ambulatory Visit: Payer: Medicare PPO | Admitting: Family Medicine

## 2015-02-09 ENCOUNTER — Other Ambulatory Visit: Payer: Self-pay | Admitting: Infectious Diseases

## 2015-02-12 ENCOUNTER — Ambulatory Visit: Payer: Medicare PPO | Admitting: Family Medicine

## 2015-02-26 ENCOUNTER — Encounter: Payer: Self-pay | Admitting: Family Medicine

## 2015-02-26 ENCOUNTER — Ambulatory Visit (INDEPENDENT_AMBULATORY_CARE_PROVIDER_SITE_OTHER): Payer: Medicare PPO | Admitting: Family Medicine

## 2015-02-26 VITALS — BP 136/71 | HR 74 | Temp 99.6°F | Resp 16 | Ht 65.5 in | Wt 230.0 lb

## 2015-02-26 DIAGNOSIS — B20 Human immunodeficiency virus [HIV] disease: Secondary | ICD-10-CM

## 2015-02-26 DIAGNOSIS — R7303 Prediabetes: Secondary | ICD-10-CM | POA: Insufficient documentation

## 2015-02-26 DIAGNOSIS — R7309 Other abnormal glucose: Secondary | ICD-10-CM | POA: Diagnosis not present

## 2015-02-26 DIAGNOSIS — L309 Dermatitis, unspecified: Secondary | ICD-10-CM

## 2015-02-26 DIAGNOSIS — I10 Essential (primary) hypertension: Secondary | ICD-10-CM | POA: Diagnosis not present

## 2015-02-26 DIAGNOSIS — F411 Generalized anxiety disorder: Secondary | ICD-10-CM

## 2015-02-26 DIAGNOSIS — E785 Hyperlipidemia, unspecified: Secondary | ICD-10-CM

## 2015-02-26 DIAGNOSIS — J302 Other seasonal allergic rhinitis: Secondary | ICD-10-CM

## 2015-02-26 LAB — BASIC METABOLIC PANEL
BUN: 15 mg/dL (ref 6–23)
CALCIUM: 9.1 mg/dL (ref 8.4–10.5)
CO2: 21 mEq/L (ref 19–32)
Chloride: 107 mEq/L (ref 96–112)
Creat: 0.68 mg/dL (ref 0.50–1.10)
Glucose, Bld: 108 mg/dL — ABNORMAL HIGH (ref 70–99)
Potassium: 3.9 mEq/L (ref 3.5–5.3)
Sodium: 139 mEq/L (ref 135–145)

## 2015-02-26 MED ORDER — CROMOLYN SODIUM 4 % OP SOLN: OPHTHALMIC | Status: DC

## 2015-02-26 MED ORDER — LORAZEPAM 1 MG PO TABS: ORAL_TABLET | ORAL | Status: DC

## 2015-02-26 MED ORDER — LEVOCETIRIZINE DIHYDROCHLORIDE 5 MG PO TABS: 2.5000 mg | ORAL_TABLET | Freq: Every evening | ORAL | Status: DC

## 2015-02-26 MED ORDER — TRIAMCINOLONE ACETONIDE 0.1 % EX CREA
TOPICAL_CREAM | CUTANEOUS | Status: DC
Start: 2015-02-26 — End: 2015-04-29

## 2015-02-26 NOTE — Progress Notes (Signed)
Subjective:    Sheri Davis ID: Sheri Davis, female    DOB: 10/13/1961, 54 y.o.   MRN: [id number]  HPI Sheri Davis is a 54 year old female with a history of hypertension, prediabetes, and hyperlipidemia presents for a 6 month follow up and medication refills. . She states that she feels well and is without complaints.   Sheri Davis is here for follow up of hypertension and hyperlipidemia.  She is not exercising and is not adherent to low salt diet.  Sheri Davis denies dizziness,  chest pain, dyspnea, fatigue, lower extremity edema, orthopnea, palpitations, syncope and tachypnea.  Cardiovascular risk factors: dyslipidemia, obesity (BMI >= 30 kg/m2) and sedentary lifestyle.   Sheri Davis is also following up on prediabetes. Last hemoglobin A1C was 6.5. Sheri Davis was not interested in starting medications at that point, so we discussed diet and exercise regimen at length. She has not been following a carbohydrate diet or exercise regimen. Sheri Davis denies foot ulcerations, increase appetite, nausea, paresthesia of the feet, polydipsia, polyuria, visual disturbances and weight loss.    Past Medical History  Diagnosis Date  . HIV infection   . Hypertension   . Hyperlipidemia   . GERD (gastroesophageal reflux disease)   . Asthma    History   Social History  . Marital Status: Single    Spouse Name: N/A  . Number of Children: N/A  . Years of Education: N/A   Occupational History  . Not on file.   Social History Main Topics  . Smoking status: Never Smoker   . Smokeless tobacco: Never Used  . Alcohol Use: Yes     Comment: occasional  . Drug Use: No  . Sexual Activity: No     Comment: pt. declined condoms   Other Topics Concern  . Not on file   Social History Narrative   Review of Systems  Constitutional: Negative.  Negative for fever, fatigue and unexpected weight change.  HENT: Positive for postnasal drip and sneezing (rhinitis). Negative for congestion.   Eyes:  Positive for itching.  Respiratory: Negative.   Cardiovascular: Negative.   Gastrointestinal: Negative.   Endocrine: Negative.  Negative for polydipsia, polyphagia and polyuria.  Genitourinary: Negative.   Musculoskeletal: Negative.   Skin: Negative.   Allergic/Immunologic: Negative.   Neurological: Negative.   Hematological: Negative.   Psychiatric/Behavioral: Negative.  Negative for suicidal ideas and sleep disturbance.       Objective:   Physical Exam  Constitutional: She is oriented to person, place, and time. She appears well-developed and well-nourished.  HENT:  Head: Normocephalic and atraumatic.  Right Ear: Hearing, tympanic membrane, external ear and ear canal normal.  Left Ear: Hearing, tympanic membrane, external ear and ear canal normal.  Nose: Mucosal edema (mucosa boggy) present. Right sinus exhibits no maxillary sinus tenderness and no frontal sinus tenderness. Left sinus exhibits no frontal sinus tenderness.  Mouth/Throat: Uvula is midline and oropharynx is clear and moist.  Eyes: Conjunctivae, EOM and lids are normal. Pupils are equal, round, and reactive to light. Lids are everted and swept, no foreign bodies found.  Neck: Trachea normal and normal range of motion. Neck supple. No thyroid mass and no thyromegaly present.  Cardiovascular: Normal rate, regular rhythm, normal heart sounds and intact distal pulses.   Pulmonary/Chest: Effort normal and breath sounds normal.  Abdominal: Soft. Bowel sounds are normal. She exhibits no distension (obesity). There is no tenderness.  Musculoskeletal: Normal range of motion.  Neurological: She is alert and oriented to person, place, and  time. She has normal reflexes.  Skin: Skin is warm and dry.  Psychiatric: She has a normal mood and affect. Her behavior is normal. Judgment and thought content normal.         BP 136/71 mmHg  Pulse 74  Temp(Src) 99.6 F (37.6 C) (Oral)  Resp 16  Ht 5' 5.5" (1.664 m)  Wt 230 lb  (104.327 kg)  BMI 37.68 kg/m2 Assessment & Plan:  1. Essential hypertension Blood pressure is at goal on current medication regimen.  - Basic Metabolic Panel - Urinalysis, Complete  2. Prediabetes Reviewed previous hemoglobin A1C - Hemoglobin A1c - Microalbumin/Creatinine Ratio, Urine  3. Hyperlipidemia Reviewed previous lipid panel.Sheri Davis did not return for fasting lipid panel, will reorder. Recommend dietary modification, , exercise, and weight control. ASCVD risk is 8.71%.  - Lipid Panel; Future  4. Human immunodeficiency virus (HIV) disease Sheri Davis is at goal on current medication regimen. Sheri Davis is followed by Dr. Ninetta Lights every 6 months.   5. Seasonal allergies - levocetirizine (XYZAL) 5 MG tablet; Take 0.5 tablets (2.5 mg total) by mouth every evening.  Dispense: 30 tablet; Refill: 2 - cromolyn (OPTICROM) 4 % ophthalmic solution; PLACE 1 DROP INTO BOTH EYES 4 TIMES A DAY AS NEEDED  Dispense: 10 mL; Refill: 1  6. Eczema - triamcinolone cream (KENALOG) 0.1 %; APPLY TO AFFECTED AREA TWICE A DAY AS NEEDED  Dispense: 30 g; Refill: 1  7. Anxiety state She reports that her anxiety is controlled with Ativan as needed. She states that she occasionally gets anxious when she spends too much time focusing on her HIV diagnosis and the death of her mother.  She also states that she becomes anxious when approaching new situations. She denies suicidal or homicidal ideations.  Recommend therapy, Sheri Davis declines at this time, she states that she has attempted therapy in the past. Will continue Ativan. - LORazepam (ATIVAN) 1 MG tablet; ONE TABLET EVERY 8 HOURS AS NEEDED  Dispense: 30 tablet; Refill: 0   RTC: 1 month for medicare wellness visit.   Massie Maroon, FNP

## 2015-02-27 LAB — URINALYSIS, COMPLETE
BACTERIA UA: NONE SEEN
Bilirubin Urine: NEGATIVE
CRYSTALS: NONE SEEN
Casts: NONE SEEN
GLUCOSE, UA: NEGATIVE mg/dL
Hgb urine dipstick: NEGATIVE
Ketones, ur: NEGATIVE mg/dL
Leukocytes, UA: NEGATIVE
NITRITE: NEGATIVE
Protein, ur: NEGATIVE mg/dL
SQUAMOUS EPITHELIAL / LPF: NONE SEEN
Specific Gravity, Urine: 1.015 (ref 1.005–1.030)
Urobilinogen, UA: 0.2 mg/dL (ref 0.0–1.0)
pH: 6 (ref 5.0–8.0)

## 2015-02-27 LAB — MICROALBUMIN / CREATININE URINE RATIO
Creatinine, Urine: 154.2 mg/dL
Microalb Creat Ratio: 4.5 mg/g (ref 0.0–30.0)
Microalb, Ur: 0.7 mg/dL (ref <2.0)

## 2015-02-27 LAB — HEMOGLOBIN A1C
HEMOGLOBIN A1C: 6.5 % — AB (ref <5.7)
Mean Plasma Glucose: 140 mg/dL — ABNORMAL HIGH (ref <117)

## 2015-03-10 ENCOUNTER — Telehealth: Payer: Self-pay | Admitting: Family Medicine

## 2015-03-10 DIAGNOSIS — R7303 Prediabetes: Secondary | ICD-10-CM | POA: Insufficient documentation

## 2015-03-10 DIAGNOSIS — E119 Type 2 diabetes mellitus without complications: Secondary | ICD-10-CM

## 2015-03-10 MED ORDER — METFORMIN HCL 500 MG PO TABS: 500.0000 mg | ORAL_TABLET | Freq: Two times a day (BID) | ORAL | Status: DC

## 2015-03-10 NOTE — Telephone Encounter (Signed)
Reviewed labs, hemoglobin A1C remains at 6.5. Will start Metformin 500 mg BID. Will schedule a follow up appt in 3 months. Diet and exercise regimen discussed at length, patient expressed understanding.   Meds ordered this encounter  Medications  . metFORMIN (GLUCOPHAGE) 500 MG tablet    Sig: Take 1 tablet (500 mg total) by mouth 2 (two) times daily with a meal.    Dispense:  60 tablet    Refill:  2    Order Specific Question:  Supervising Provider    Answer:  Marthann SchillerMATTHEWS, MICHELLE A [[id number]]  Massie MaroonHollis,Lachina M, FNP

## 2015-03-29 ENCOUNTER — Ambulatory Visit: Payer: Medicare PPO | Admitting: Internal Medicine

## 2015-04-12 ENCOUNTER — Telehealth: Payer: Self-pay | Admitting: Internal Medicine

## 2015-04-12 DIAGNOSIS — F411 Generalized anxiety disorder: Secondary | ICD-10-CM

## 2015-04-12 MED ORDER — LORAZEPAM 1 MG PO TABS: ORAL_TABLET | ORAL | Status: DC

## 2015-04-12 NOTE — Telephone Encounter (Signed)
Meds ordered this encounter  Medications  . LORazepam (ATIVAN) 1 MG tablet    Sig: ONE TABLET EVERY 8 HOURS AS NEEDED    Dispense:  30 tablet    Refill:  0    Order Specific Question:  Supervising Provider    Answer:  Marthann SchillerMATTHEWS, MICHELLE A [[id number]]  Sujay Grundman M, FNP    Reviewed [place] Substance Reporting system prior to reorder, no inconsistencies noted.

## 2015-04-12 NOTE — Telephone Encounter (Signed)
Refill request for ativan 1mg . LOV 02/26/2015. Please advise. Thanks!

## 2015-04-13 NOTE — Telephone Encounter (Signed)
Appointment scheduled for 04/29/2015. Thanks!

## 2015-04-14 ENCOUNTER — Other Ambulatory Visit: Payer: Self-pay | Admitting: Family Medicine

## 2015-04-16 ENCOUNTER — Other Ambulatory Visit: Payer: Self-pay | Admitting: Family Medicine

## 2015-04-28 ENCOUNTER — Other Ambulatory Visit: Payer: Self-pay | Admitting: Infectious Diseases

## 2015-04-28 DIAGNOSIS — Z1231 Encounter for screening mammogram for malignant neoplasm of breast: Secondary | ICD-10-CM

## 2015-04-29 ENCOUNTER — Other Ambulatory Visit (HOSPITAL_COMMUNITY)
Admission: RE | Admit: 2015-04-29 | Discharge: 2015-04-29 | Disposition: A | Discharge status: Home / Self Care | Payer: Medicare PPO | Source: Ambulatory Visit | Attending: Infectious Diseases | Admitting: Infectious Diseases

## 2015-04-29 ENCOUNTER — Other Ambulatory Visit: Payer: Medicare PPO

## 2015-04-29 ENCOUNTER — Other Ambulatory Visit: Payer: Self-pay | Admitting: Internal Medicine

## 2015-04-29 ENCOUNTER — Encounter: Payer: Self-pay | Admitting: Internal Medicine

## 2015-04-29 ENCOUNTER — Ambulatory Visit (INDEPENDENT_AMBULATORY_CARE_PROVIDER_SITE_OTHER): Payer: Medicare PPO | Admitting: Internal Medicine

## 2015-04-29 VITALS — BP 114/73 | HR 62 | Temp 98.4°F | Resp 16 | Ht 66.0 in | Wt 229.0 lb

## 2015-04-29 DIAGNOSIS — Z23 Encounter for immunization: Secondary | ICD-10-CM | POA: Diagnosis not present

## 2015-04-29 DIAGNOSIS — Z113 Encounter for screening for infections with a predominantly sexual mode of transmission: Secondary | ICD-10-CM

## 2015-04-29 DIAGNOSIS — K219 Gastro-esophageal reflux disease without esophagitis: Secondary | ICD-10-CM | POA: Diagnosis not present

## 2015-04-29 DIAGNOSIS — N393 Stress incontinence (female) (male): Secondary | ICD-10-CM

## 2015-04-29 DIAGNOSIS — J45909 Unspecified asthma, uncomplicated: Secondary | ICD-10-CM

## 2015-04-29 DIAGNOSIS — R7309 Other abnormal glucose: Secondary | ICD-10-CM

## 2015-04-29 DIAGNOSIS — J4530 Mild persistent asthma, uncomplicated: Secondary | ICD-10-CM

## 2015-04-29 DIAGNOSIS — B20 Human immunodeficiency virus [HIV] disease: Secondary | ICD-10-CM

## 2015-04-29 DIAGNOSIS — R000 Tachycardia, unspecified: Secondary | ICD-10-CM | POA: Diagnosis not present

## 2015-04-29 DIAGNOSIS — F411 Generalized anxiety disorder: Secondary | ICD-10-CM

## 2015-04-29 DIAGNOSIS — L309 Dermatitis, unspecified: Secondary | ICD-10-CM

## 2015-04-29 DIAGNOSIS — R7303 Prediabetes: Secondary | ICD-10-CM

## 2015-04-29 DIAGNOSIS — J302 Other seasonal allergic rhinitis: Secondary | ICD-10-CM

## 2015-04-29 DIAGNOSIS — Z79899 Other long term (current) drug therapy: Secondary | ICD-10-CM

## 2015-04-29 DIAGNOSIS — J309 Allergic rhinitis, unspecified: Secondary | ICD-10-CM

## 2015-04-29 LAB — CBC
HCT: 39.2 % (ref 36.0–46.0)
Hemoglobin: 13 g/dL (ref 12.0–15.0)
MCH: 31.5 pg (ref 26.0–34.0)
MCHC: 33.2 g/dL (ref 30.0–36.0)
MCV: 94.9 fL (ref 78.0–100.0)
MPV: 9.9 fL (ref 8.6–12.4)
Platelets: 312 10*3/uL (ref 150–400)
RBC: 4.13 MIL/uL (ref 3.87–5.11)
RDW: 13.4 % (ref 11.5–15.5)
WBC: 6.8 10*3/uL (ref 4.0–10.5)

## 2015-04-29 LAB — COMPREHENSIVE METABOLIC PANEL
ALK PHOS: 67 U/L (ref 39–117)
ALT: 27 U/L (ref 0–35)
AST: 23 U/L (ref 0–37)
Albumin: 3.9 g/dL (ref 3.5–5.2)
BILIRUBIN TOTAL: 0.2 mg/dL (ref 0.2–1.2)
BUN: 15 mg/dL (ref 6–23)
CALCIUM: 9.2 mg/dL (ref 8.4–10.5)
CO2: 24 mEq/L (ref 19–32)
CREATININE: 0.69 mg/dL (ref 0.50–1.10)
Chloride: 106 mEq/L (ref 96–112)
GLUCOSE: 87 mg/dL (ref 70–99)
Potassium: 4.8 mEq/L (ref 3.5–5.3)
Sodium: 139 mEq/L (ref 135–145)
Total Protein: 6.5 g/dL (ref 6.0–8.3)

## 2015-04-29 LAB — LIPID PANEL
Cholesterol: 262 mg/dL — ABNORMAL HIGH (ref 0–200)
HDL: 72 mg/dL (ref >=46)
LDL CALC: 160 mg/dL — AB (ref 0–99)
Total CHOL/HDL Ratio: 3.6 Ratio
Triglycerides: 151 mg/dL — ABNORMAL HIGH (ref <150)
VLDL: 30 mg/dL (ref 0–40)

## 2015-04-29 MED ORDER — LANSOPRAZOLE 30 MG PO CPDR: DELAYED_RELEASE_CAPSULE | ORAL | Status: DC

## 2015-04-29 MED ORDER — CROMOLYN SODIUM 4 % OP SOLN: OPHTHALMIC | Status: DC

## 2015-04-29 MED ORDER — FLUTICASONE PROPIONATE 50 MCG/ACT NA SUSP: NASAL | Status: DC

## 2015-04-29 MED ORDER — LEVOCETIRIZINE DIHYDROCHLORIDE 5 MG PO TABS: 2.5000 mg | ORAL_TABLET | Freq: Every evening | ORAL | Status: DC

## 2015-04-29 MED ORDER — TRIAMCINOLONE ACETONIDE 0.1 % EX CREA
TOPICAL_CREAM | CUTANEOUS | Status: DC
Start: 2015-04-29 — End: 2016-04-28

## 2015-04-29 MED ORDER — LORAZEPAM 1 MG PO TABS: ORAL_TABLET | ORAL | Status: DC

## 2015-04-29 MED ORDER — ALBUTEROL SULFATE HFA 108 (90 BASE) MCG/ACT IN AERS: INHALATION_SPRAY | RESPIRATORY_TRACT | Status: DC

## 2015-04-29 NOTE — Telephone Encounter (Signed)
Medications reordered.

## 2015-04-29 NOTE — Progress Notes (Signed)
Patient ID: Sheri Davis, female   DOB: [birth year], 54 y.o.   MRN: [id number]  MEDICARE ANNUAL WELLNESS VISIT   Assessment:   1. Prediabetes - Pt is in a state of pre-diabetes and has been appropriately prescribed Metformin. She has not been compliant with Metformin and does not want to continue taking this. She will try diet and exercise for the next 6 months and then re-assess. I have explained the risks to delay in initiation of treatment and she verbalizes understanding and reserves her right to refuse. I will refer to diabetic education. - Ambulatory referral to diabetic education - Ambulatory referral to Ophthalmology  2. Urinary, incontinence, stress female - Discussed stress urinary incontinence. Advised on Kegel exercises for pelvic floor strengthening. Also recommended trial of Poise Impressa bladder support.  3. Tachycardia - Pt is on a B-Blocker at a minimal dose. 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 She was not evaluated with an EKG and this was for symptomatic treatment of the tachycardia associated with anxiety. She wants to stop the Lopressor 25 mg and re-assess HR and blood pressure without it.  - Discontinue Lopressor and re-assess BP and HR without medication.  4. Gastroesophageal reflux disease, esophagitis presence not specified - Pt has been on PPI's for several years due to clinical symptoms of GERD. She has never been evaluated endoscopically despite persistent symptoms beyond reasonable course of treatment.  5. Asthma, unspecified asthma severity, uncomplicated - Pt has never been evaluated with PFT's and pulmonary function unclear. Query indication for controller and rescue inhalers. - Pulmonary function test; Future  6. Immunization due - Pneumococcal conjugate vaccine 13-valent   Plan:   During the course of the visit the patient was educated and counseled about appropriate  screening and preventive services including:    Pneumococcal vaccine   Influenza vaccine  Td vaccine  Screening electrocardiogram  Bone densitometry screening  Colorectal cancer screening  Diabetes screening  Glaucoma screening  Nutrition counseling   Advanced directives: requested  Screening recommendations, referrals: Vaccinations: Please see documentation below and orders this visit.  Nutrition assessed and recommended  Colonoscopy not indicated Recommended yearly ophthalmology/optometry visit for glaucoma screening and checkup Recommended yearly dental visit for hygiene and checkup Advanced directives - requested  Conditions/risks identified: BMI: Discussed weight loss, diet, and increase physical activity.  Increase physical activity: AHA recommends 150 minutes of physical activity a week.  Medications reviewed Pt has pre-diabetes and was prescribed Metformin 500 mg but she has discontinued taking it and wants to try diet before proceeding further with taking Metformin. Stress Urinary Incontinence is an issue: discussed non pharmacology and pharmacology options.  Fall risk: low- discussed PT, home fall assessment, medications.    Subjective:  Sheri Davis is a 54 y.o. female who presents for Medicare Annual Wellness Visit.  Date of last medicare wellness visit is unknown.  She has not had elevated blood pressure.Marland Kitchen Her blood pressure has been controlled at home, today their BP is BP: 114/73 mmHg She does workout. She denies chest pain, shortness of breath, dizziness.  She is not on cholesterol medication (she was prescribed cholesterol medication but stopped it more than 1 year ago).  Her cholesterol is not at goal. The cholesterol last visit was:   Lab Results  Component Value Date   CHOL 266* 10/09/2014   HDL 74 10/09/2014   LDLCALC 175* 10/09/2014   TRIG 86 10/09/2014  CHOLHDL 3.6 10/09/2014   She ispre-diabetic and was prescribed Metformin 500  mg BID however she is n ot compliant with Metformin due to diarrhea and reports that she does not want to take any medications at this time as her Hb A1c has remained stable over several years and she has not had a focused effort on diet and exercise.She wants to try diet and exercise before starting any medications. . She has not been working on diet and exercise for prediabetes, and denies hyperglycemia, hypoglycemia , increased appetite, nausea, polydipsia, polyuria, visual disturbances, vomiting and weight loss. Last A1C in the office was:  Lab Results  Component Value Date   HGBA1C 6.5* 02/26/2015   Patient is not on Vitamin D supplement.   No results found for: VD25OH     Names of Other Physician/Practitioners you currently use: 1. [facility] here for primary care 2. [facility], last visit 1 year ago. (Wants to change). 3. (Does not remember name), dentist, last visit > 1 year ago.  4. Dr. Ninetta Lights- Infectious Disease Patient Care Team: Altha Harm, MD as PCP - General (Internal Medicine)   Medication Review: Current Outpatient Prescriptions on File Prior to Visit  Medication Sig Dispense Refill  . albuterol (VENTOLIN HFA) 108 (90 BASE) MCG/ACT inhaler INHALE ONE PUFF INTO LUNGS EVERY FOUR HOURS AS NEEDED FOR SHORTNESS OF BREATH 18 g 1  . cromolyn (OPTICROM) 4 % ophthalmic solution PLACE 1 DROP INTO BOTH EYES 4 TIMES A DAY AS NEEDED 10 mL 1  . efavirenz-emtricitabine-tenofovir (ATRIPLA) 600-200-300 MG per tablet TAKE 1 TABLET BY MOUTH AT BEDTIME 30 tablet 5  . fluticasone (FLONASE) 50 MCG/ACT nasal spray USE ONE SPRAY IN EACH NOSTRIL ONCE A DAY 16 g 1  . fluticasone (FLOVENT HFA) 110 MCG/ACT inhaler Inhale 1 puff into the lungs daily as needed (for shortness of breath). (Patient taking differently: Inhale 1 puff into the lungs daily. Pt using as needed.) 1 Inhaler 6  . lansoprazole (PREVACID) 30 MG capsule TAKE ONE (1) CAPSULE EACH DAY AT NOON 30  capsule 4  . levocetirizine (XYZAL) 5 MG tablet Take 0.5 tablets (2.5 mg total) by mouth every evening. 30 tablet 2  . LORazepam (ATIVAN) 1 MG tablet ONE TABLET EVERY 8 HOURS AS NEEDED 30 tablet 0  . metFORMIN (GLUCOPHAGE) 500 MG tablet Take 1 tablet (500 mg total) by mouth 2 (two) times daily with a meal. 60 tablet 2  . triamcinolone cream (KENALOG) 0.1 % APPLY TO AFFECTED AREA TWICE A DAY AS NEEDED 30 g 1   No current facility-administered medications on file prior to visit.    Current Problems (verified) Patient Active Problem List   Diagnosis Date Noted  . Pre-diabetes 03/10/2015  . Prediabetes 02/26/2015  . History of asthma 10/16/2014  . Anxiety state 10/16/2014  . Hyperglycemia 01/08/2013  . Refusal of blood transfusions as patient is Jehovah's Witness 03/29/2011  . Hyperlipidemia 05/23/2010  . Esophageal reflux 04/01/2008  . DISORDER, POSTTRAUMATIC STRESS 04/15/2007  . Depression 04/15/2007  . Essential hypertension 04/15/2007  . ASTHMA 04/15/2007  . Human immunodeficiency virus (HIV) disease 03/07/2007    Screening Tests Health Maintenance  Topic Date Due  . FOOT EXAM  02/03/1972  . OPHTHALMOLOGY EXAM  02/03/1972  . PNEUMOCOCCAL POLYSACCHARIDE VACCINE (2) 08/27/2012  . MAMMOGRAM  05/01/2015  . INFLUENZA VACCINE  06/28/2015  . HEMOGLOBIN A1C  08/28/2015  . LIPID PANEL  10/10/2015  . PAP SMEAR  01/29/2016  . URINE MICROALBUMIN  02/26/2016  . COLONOSCOPY  04/23/2023  . TETANUS/TDAP  08/26/2024  . HIV Screening  Completed    Immunization History  Administered Date(s) Administered  . Influenza Split 09/08/2011, 08/14/2012  . Influenza Whole 08/28/2007, 09/09/2007, 09/14/2008, 09/24/2009, 09/12/2010  . Influenza,inj,Quad PF,36+ Mos 08/26/2014  . Influenza-Unspecified 08/25/2013  . PPD Test 01/29/2015  . Pneumococcal Polysaccharide-23 08/28/2007, 01/29/2012  . Tdap 08/26/2014    Preventative care: Last colonoscopy: 04/22/2013 Last mammogram: 04/30/2014 Last  pap smear/pelvic exam: 01/29/2015   DEXA: Not indicted   Prevnar13: Today     Medication List       This list is accurate as of: 04/29/15 11:46 AM.  Always use your most recent med list.               albuterol 108 (90 BASE) MCG/ACT inhaler  Commonly known as:  VENTOLIN HFA  INHALE ONE PUFF INTO LUNGS EVERY FOUR HOURS AS NEEDED FOR SHORTNESS OF BREATH     cromolyn 4 % ophthalmic solution  Commonly known as:  OPTICROM  PLACE 1 DROP INTO BOTH EYES 4 TIMES A DAY AS NEEDED     efavirenz-emtricitabine-tenofovir 600-200-300 MG per tablet  Commonly known as:  ATRIPLA  TAKE 1 TABLET BY MOUTH AT BEDTIME     fluticasone 110 MCG/ACT inhaler  Commonly known as:  FLOVENT HFA  Inhale 1 puff into the lungs daily as needed (for shortness of breath).     fluticasone 50 MCG/ACT nasal spray  Commonly known as:  FLONASE  USE ONE SPRAY IN EACH NOSTRIL ONCE A DAY     lansoprazole 30 MG capsule  Commonly known as:  PREVACID  TAKE ONE (1) CAPSULE EACH DAY AT NOON     levocetirizine 5 MG tablet  Commonly known as:  XYZAL  Take 0.5 tablets (2.5 mg total) by mouth every evening.     LORazepam 1 MG tablet  Commonly known as:  ATIVAN  ONE TABLET EVERY 8 HOURS AS NEEDED     metFORMIN 500 MG tablet  Commonly known as:  GLUCOPHAGE  Take 1 tablet (500 mg total) by mouth 2 (two) times daily with a meal.     triamcinolone cream 0.1 %  Commonly known as:  KENALOG  APPLY TO AFFECTED AREA TWICE A DAY AS NEEDED        Past Surgical History  Procedure Laterality Date  . No past surgeries    . Colonoscopy with propofol N/A 04/22/2013    Procedure: COLONOSCOPY WITH PROPOFOL;  Surgeon: Charolett BumpersMartin K Johnson, MD;  Location: [facility];  Service: Endoscopy;  Laterality: N/A;   Family History  Problem Relation Age of Onset  . Osteoarthritis Father   . Diabetes Father   . Hypertension Father   . Heart disease Mother   . Depression Mother   . Mental illness Mother   . Anemia Mother   . Hypokalemia  Mother   . Diabetes Brother     History reviewed: allergies, current medications, past family history, past medical history, past social history, past surgical history and problem list   Risk Factors: Osteoporosis/FallRisk: postmenopausal estrogen deficiency and amenorrhea In the past year have you fallen or had a near fall?:No History of fracture in the past year: no  Tobacco History  Substance Use Topics  . Smoking status: Never Smoker   . Smokeless tobacco: Never Used  . Alcohol Use: Yes     Comment: occasional   She does not smoke.  Patient is not a former smoker. Are there  smokers in your home (other than you)?  No  Alcohol Current alcohol use: social drinker  Caffeine Current caffeine use: denies use  Exercise Current exercise: aerobics  Nutrition/Diet Current diet: in general, a "healthy" diet    Cardiac risk factors: dyslipidemia and obesity (BMI >= 30 kg/m2).  Depression Screen (Note: if answer to either of the following is "Yes", a more complete depression screening is indicated)   Q1: Over the past two weeks, have you felt down, depressed or hopeless? No  Q2: Over the past two weeks, have you felt little interest or pleasure in doing things? No  Have you lost interest or pleasure in daily life? No  Do you often feel hopeless? No  Do you cry easily over simple problems? No  Activities of Daily Living In your present state of health, do you have any difficulty performing the following activities?:  Driving? No Managing money?  No Feeding yourself? No Getting from bed to chair? No Climbing a flight of stairs? No Preparing food and eating?: No Bathing or showering? No Getting dressed: No Getting to the toilet? No Using the toilet:No Moving around from place to place: No In the past year have you fallen or had a near fall?:No   Are you sexually active?  No  Do you have more than one partner?  N/A  Vision Difficulties: No  Hearing Difficulties:  No Do you often ask people to speak up or repeat themselves? No Do you experience ringing or noises in your ears? No Do you have difficulty understanding soft or whispered voices? No  Cognition  Do you feel that you have a problem with memory?No  Do you often misplace items? No  Do you feel safe at home?  Yes  Advanced directives Does patient have a Health Care Power of Attorney? No Does patient have a Living Will? No   Objective:     Blood pressure 114/73, pulse 62, temperature 98.4 F (36.9 C), temperature source Oral, resp. rate 16, height  (1.676 m), weight 229 lb (103.874 kg). Body mass index is 36.98 kg/(m^2).  General appearance: alert, no distress, WD/WN, female Cognitive Testing  Alert? Yes  Normal Appearance?Yes  Oriented to person? Yes  Place? Yes   Time? Yes  Recall of three objects?  Yes  Can perform simple calculations? Yes  Displays appropriate judgment?Yes  Can read the correct time from a watch face?Yes   Medicare Attestation I have personally reviewed: The patient's medical and social history Their use of alcohol, tobacco or illicit drugs Their current medications and supplements The patient's functional ability including ADLs,fall risks, home safety risks, cognitive, and hearing and visual impairment Diet and physical activities Evidence for depression or mood disorders  The patient's weight, height, BMI, and visual acuity have been recorded in the chart.  I have made referrals, counseling, and provided education to the patient based on review of the above and I have provided the patient with a written personalized care plan for preventive services.     MATTHEWS,MICHELLE A., MD   04/29/2015

## 2015-04-30 LAB — HIV-1 RNA QUANT-NO REFLEX-BLD
HIV 1 RNA Quant: <20 copies/mL (ref <20)
HIV-1 RNA Quant, Log: <1.3 {Log} (ref <1.30)

## 2015-04-30 LAB — T-HELPER CELL (CD4) - (RCID CLINIC ONLY)
CD4 T CELL HELPER: 39 % (ref 33–55)
CD4 T Cell Abs: 1170 /uL (ref 400–2700)

## 2015-04-30 LAB — RPR

## 2015-05-03 ENCOUNTER — Other Ambulatory Visit: Payer: Medicare PPO

## 2015-05-03 DIAGNOSIS — N393 Stress incontinence (female) (male): Secondary | ICD-10-CM | POA: Insufficient documentation

## 2015-05-03 LAB — URINE CYTOLOGY ANCILLARY ONLY
Chlamydia: NEGATIVE
Neisseria Gonorrhea: NEGATIVE

## 2015-05-06 ENCOUNTER — Ambulatory Visit (HOSPITAL_COMMUNITY): Payer: Medicare PPO

## 2015-05-07 ENCOUNTER — Other Ambulatory Visit: Payer: Self-pay | Admitting: Infectious Diseases

## 2015-05-10 ENCOUNTER — Other Ambulatory Visit: Payer: Self-pay | Admitting: Licensed Clinical Social Worker

## 2015-05-10 DIAGNOSIS — J302 Other seasonal allergic rhinitis: Secondary | ICD-10-CM

## 2015-05-10 MED ORDER — CROMOLYN SODIUM 4 % OP SOLN: OPHTHALMIC | Status: DC

## 2015-05-13 ENCOUNTER — Ambulatory Visit (HOSPITAL_COMMUNITY): Payer: Medicare PPO

## 2015-05-17 ENCOUNTER — Ambulatory Visit: Payer: Medicare PPO | Admitting: Infectious Diseases

## 2015-05-21 ENCOUNTER — Other Ambulatory Visit: Payer: Self-pay | Admitting: Infectious Diseases

## 2015-05-26 ENCOUNTER — Ambulatory Visit (HOSPITAL_COMMUNITY)
Admission: RE | Admit: 2015-05-26 | Discharge: 2015-05-26 | Disposition: A | Discharge status: Home / Self Care | Payer: Medicare PPO | Source: Ambulatory Visit | Attending: Infectious Diseases | Admitting: Infectious Diseases

## 2015-05-26 DIAGNOSIS — Z1231 Encounter for screening mammogram for malignant neoplasm of breast: Secondary | ICD-10-CM

## 2015-05-27 ENCOUNTER — Ambulatory Visit (HOSPITAL_COMMUNITY): Payer: Medicare PPO

## 2015-06-02 ENCOUNTER — Telehealth: Payer: Self-pay

## 2015-06-02 NOTE — Telephone Encounter (Signed)
Refill request for lorazepam 1mg . LOV 04/29/2015. Please advise. Thanks!

## 2015-06-02 NOTE — Telephone Encounter (Signed)
We no longer prescribe Ativan. Will need to see a mental health professional for help with anxiety.

## 2015-06-09 ENCOUNTER — Ambulatory Visit: Payer: Medicare PPO | Admitting: Infectious Diseases

## 2015-06-11 ENCOUNTER — Telehealth: Payer: Self-pay

## 2015-06-11 NOTE — Telephone Encounter (Signed)
[facility], she states office has tried to contact patient by phone and mail to schedule an appointment for eye exam and patient has not responded. Thanks!

## 2015-06-24 ENCOUNTER — Ambulatory Visit: Payer: Medicare PPO | Admitting: Infectious Diseases

## 2015-07-01 ENCOUNTER — Other Ambulatory Visit: Payer: Self-pay | Admitting: Infectious Diseases

## 2015-07-16 ENCOUNTER — Other Ambulatory Visit: Payer: Self-pay | Admitting: Infectious Diseases

## 2015-08-03 ENCOUNTER — Other Ambulatory Visit: Payer: Self-pay | Admitting: Infectious Diseases

## 2015-08-03 ENCOUNTER — Other Ambulatory Visit: Payer: Self-pay | Admitting: Internal Medicine

## 2015-08-03 NOTE — Telephone Encounter (Signed)
Pt now receiving refills from Dr Marthann Schiller office.

## 2015-08-04 ENCOUNTER — Other Ambulatory Visit: Payer: Self-pay | Admitting: Infectious Diseases

## 2015-08-10 ENCOUNTER — Telehealth: Payer: Self-pay | Admitting: Family Medicine

## 2015-08-11 NOTE — Telephone Encounter (Signed)
Called, no answer. Patient needs to get this medication at mental health facility, per Bonita Quin we can not prescribe this at our facility. Thanks!

## 2015-08-18 ENCOUNTER — Ambulatory Visit (INDEPENDENT_AMBULATORY_CARE_PROVIDER_SITE_OTHER): Payer: Medicare PPO | Admitting: Infectious Diseases

## 2015-08-18 ENCOUNTER — Encounter: Payer: Self-pay | Admitting: Infectious Diseases

## 2015-08-18 ENCOUNTER — Ambulatory Visit: Payer: Medicare PPO | Admitting: Infectious Diseases

## 2015-08-18 VITALS — BP 138/86 | HR 55 | Temp 98.6°F | Wt 231.0 lb

## 2015-08-18 DIAGNOSIS — R7309 Other abnormal glucose: Secondary | ICD-10-CM | POA: Diagnosis not present

## 2015-08-18 DIAGNOSIS — Z23 Encounter for immunization: Secondary | ICD-10-CM

## 2015-08-18 DIAGNOSIS — F329 Major depressive disorder, single episode, unspecified: Secondary | ICD-10-CM

## 2015-08-18 DIAGNOSIS — B20 Human immunodeficiency virus [HIV] disease: Secondary | ICD-10-CM

## 2015-08-18 DIAGNOSIS — J4520 Mild intermittent asthma, uncomplicated: Secondary | ICD-10-CM

## 2015-08-18 DIAGNOSIS — R7303 Prediabetes: Secondary | ICD-10-CM

## 2015-08-18 DIAGNOSIS — F32A Depression, unspecified: Secondary | ICD-10-CM

## 2015-08-18 MED ORDER — EMTRICITAB-RILPIVIR-TENOFOV AF 200-25-25 MG PO TABS: 1.0000 | ORAL_TABLET | Freq: Every day | ORAL | Status: DC

## 2015-08-18 NOTE — Assessment & Plan Note (Signed)
Will get her in with mental health.  Perhaps getting off atripla will help?

## 2015-08-18 NOTE — Assessment & Plan Note (Signed)
Very very mild wheeze today.  Has prn inhalers.

## 2015-08-18 NOTE — Assessment & Plan Note (Signed)
Will change her to odefsy Set her up for pap Flu shot today rtc in 3-4 months

## 2015-08-18 NOTE — Progress Notes (Signed)
   Subjective:    Patient ID: Sheri Davis, female    DOB: 1961/11/12, 54 y.o.   MRN: [id number]  HPI 54 yo F with HIV+ 2007, HTN (on atenolol), hyperlipidemia (off pravachol), depression.  Has been on atripla since dx. States at last visit was very depressed, attributes her prev drop in CD4 to this. She was referred to mental health, has not made f/u.  No problems with atripla.  Sleep is good with ativan.  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  [facility].  Has had mammogram this year, pap not done yet.   HIV 1 RNA QUANT (copies/mL)  Date Value  04/29/2015 <20  11/05/2014 30*  04/30/2014 23*   CD4 T CELL ABS (/uL)  Date Value  04/29/2015 1170  11/05/2014 970  04/30/2014 1140   Lab Results  Component Value Date   CHOL 262* 04/29/2015   HDL 72 04/29/2015   LDLCALC 160* 04/29/2015   TRIG 151* 04/29/2015   CHOLHDL 3.6 04/29/2015      Review of Systems  Constitutional: Negative for appetite change and unexpected weight change.  Respiratory: Negative for cough and shortness of breath.   Gastrointestinal: Negative for diarrhea and constipation.  Genitourinary: Negative for difficulty urinating.  Neurological: Positive for headaches.  Psychiatric/Behavioral: Negative for sleep disturbance.       Objective:   Physical Exam  Constitutional: She appears well-developed and well-nourished.  HENT:  Mouth/Throat: No oropharyngeal exudate.  Eyes: EOM are normal. Pupils are equal, round, and reactive to light.  Neck: Neck supple.  Cardiovascular: Normal rate, regular rhythm and normal heart sounds.   Pulmonary/Chest: Effort normal and breath sounds normal.  Abdominal: Soft. Bowel sounds are normal. There is no tenderness. There is no rebound.  Lymphadenopathy:    She has no cervical adenopathy.       Assessment & Plan:

## 2015-08-18 NOTE — Assessment & Plan Note (Signed)
glc from June is nl Has f/u next month with PCP

## 2015-08-25 ENCOUNTER — Ambulatory Visit: Payer: Medicare PPO | Admitting: Infectious Diseases

## 2015-08-26 ENCOUNTER — Telehealth: Payer: Self-pay | Admitting: Family Medicine

## 2015-08-26 NOTE — Telephone Encounter (Signed)
Received request for for medical records from [employer].  

## 2015-08-30 ENCOUNTER — Encounter: Payer: Medicare PPO | Admitting: Family Medicine

## 2015-08-31 ENCOUNTER — Telehealth: Payer: Self-pay | Admitting: Infectious Diseases

## 2015-08-31 NOTE — Telephone Encounter (Signed)
Patient called because pharmacy will not fill her Sheri Davis because it interacts with Prevacid. Patient would like to go back to Atripla because she does not want to stop Prevacid. Please advise

## 2015-09-01 ENCOUNTER — Other Ambulatory Visit: Payer: Self-pay | Admitting: Infectious Diseases

## 2015-09-01 ENCOUNTER — Telehealth: Payer: Self-pay

## 2015-09-01 MED ORDER — EFAVIRENZ-EMTRICITAB-TENOFOVIR 600-200-300 MG PO TABS: 1.0000 | ORAL_TABLET | Freq: Every day | ORAL | Status: DC

## 2015-09-01 NOTE — Telephone Encounter (Signed)
Pharmacy requesting Atripla on behalf of the patient. Per patient :  Sheri Davis has drug interactions with her other medications. I will call the patient to see which medication is causing the problem.    Left message on machine to call the office   Taura Lamarre Gorden Harms, RN

## 2015-09-01 NOTE — Telephone Encounter (Signed)
Ok switched back to [employer]

## 2015-09-01 NOTE — Telephone Encounter (Signed)
Ok to refill atripla thanks

## 2015-09-06 ENCOUNTER — Telehealth: Payer: Self-pay | Admitting: Family Medicine

## 2015-09-06 ENCOUNTER — Other Ambulatory Visit: Payer: Self-pay | Admitting: Infectious Diseases

## 2015-09-06 NOTE — Telephone Encounter (Signed)
Received medical records request from [employer]. Forwarded to [employer]. 

## 2015-09-08 ENCOUNTER — Other Ambulatory Visit: Payer: Self-pay | Admitting: Family Medicine

## 2015-09-08 ENCOUNTER — Telehealth: Payer: Self-pay | Admitting: *Deleted

## 2015-09-08 MED ORDER — ATENOLOL 25 MG PO TABS: ORAL_TABLET | ORAL | Status: DC

## 2015-09-08 NOTE — Telephone Encounter (Signed)
Received request for last office note and labs to be faxed to case manager, Laurette SchimkeJulie Willis. Done.

## 2015-09-08 NOTE — Telephone Encounter (Signed)
REFERENCE# :[id number]

## 2015-09-09 ENCOUNTER — Telehealth: Payer: Self-pay | Admitting: *Deleted

## 2015-09-09 ENCOUNTER — Ambulatory Visit (INDEPENDENT_AMBULATORY_CARE_PROVIDER_SITE_OTHER): Payer: Medicare PPO | Admitting: Family Medicine

## 2015-09-09 ENCOUNTER — Encounter: Payer: Self-pay | Admitting: Family Medicine

## 2015-09-09 ENCOUNTER — Telehealth: Payer: Self-pay

## 2015-09-09 ENCOUNTER — Other Ambulatory Visit: Payer: Self-pay | Admitting: Infectious Diseases

## 2015-09-09 VITALS — BP 136/82 | HR 62 | Temp 98.1°F | Resp 16 | Ht 65.0 in | Wt 230.0 lb

## 2015-09-09 DIAGNOSIS — I10 Essential (primary) hypertension: Secondary | ICD-10-CM | POA: Diagnosis not present

## 2015-09-09 DIAGNOSIS — E785 Hyperlipidemia, unspecified: Secondary | ICD-10-CM | POA: Diagnosis not present

## 2015-09-09 DIAGNOSIS — F4310 Post-traumatic stress disorder, unspecified: Secondary | ICD-10-CM

## 2015-09-09 DIAGNOSIS — J302 Other seasonal allergic rhinitis: Secondary | ICD-10-CM

## 2015-09-09 DIAGNOSIS — H1010 Acute atopic conjunctivitis, unspecified eye: Secondary | ICD-10-CM | POA: Insufficient documentation

## 2015-09-09 DIAGNOSIS — B20 Human immunodeficiency virus [HIV] disease: Secondary | ICD-10-CM

## 2015-09-09 DIAGNOSIS — R7303 Prediabetes: Secondary | ICD-10-CM | POA: Diagnosis not present

## 2015-09-09 DIAGNOSIS — F411 Generalized anxiety disorder: Secondary | ICD-10-CM

## 2015-09-09 DIAGNOSIS — K219 Gastro-esophageal reflux disease without esophagitis: Secondary | ICD-10-CM

## 2015-09-09 DIAGNOSIS — J309 Allergic rhinitis, unspecified: Secondary | ICD-10-CM

## 2015-09-09 LAB — COMPLETE METABOLIC PANEL WITH GFR
ALT: 25 U/L (ref 6–29)
AST: 21 U/L (ref 10–35)
Albumin: 4.1 g/dL (ref 3.6–5.1)
Alkaline Phosphatase: 79 U/L (ref 33–130)
BILIRUBIN TOTAL: 0.3 mg/dL (ref 0.2–1.2)
BUN: 11 mg/dL (ref 7–25)
CO2: 24 mmol/L (ref 20–31)
CREATININE: 0.67 mg/dL (ref 0.50–1.05)
Calcium: 9.3 mg/dL (ref 8.6–10.4)
Chloride: 104 mmol/L (ref 98–110)
GFR, Est African American: >89 mL/min (ref >=60)
GLUCOSE: 100 mg/dL — AB (ref 65–99)
Potassium: 4.2 mmol/L (ref 3.5–5.3)
SODIUM: 138 mmol/L (ref 135–146)
TOTAL PROTEIN: 6.7 g/dL (ref 6.1–8.1)

## 2015-09-09 LAB — POCT URINALYSIS DIP (DEVICE)
Bilirubin Urine: NEGATIVE
Glucose, UA: NEGATIVE mg/dL
HGB URINE DIPSTICK: NEGATIVE
KETONES UR: NEGATIVE mg/dL
Leukocytes, UA: NEGATIVE
Nitrite: NEGATIVE
Protein, ur: NEGATIVE mg/dL
Urobilinogen, UA: 0.2 mg/dL (ref 0.0–1.0)
pH: 5.5 (ref 5.0–8.0)

## 2015-09-09 LAB — LIPID PANEL
Cholesterol: 294 mg/dL — ABNORMAL HIGH (ref 125–200)
HDL: 70 mg/dL (ref >=46)
LDL CALC: 196 mg/dL — AB (ref <130)
Total CHOL/HDL Ratio: 4.2 Ratio (ref <=5.0)
Triglycerides: 141 mg/dL (ref <150)
VLDL: 28 mg/dL (ref <30)

## 2015-09-09 LAB — GLUCOSE, CAPILLARY: Glucose-Capillary: 95 mg/dL (ref 65–99)

## 2015-09-09 MED ORDER — LORAZEPAM 1 MG PO TABS: ORAL_TABLET | ORAL | Status: DC

## 2015-09-09 MED ORDER — FLUTICASONE PROPIONATE 50 MCG/ACT NA SUSP: NASAL | Status: DC

## 2015-09-09 MED ORDER — LANSOPRAZOLE 30 MG PO CPDR: DELAYED_RELEASE_CAPSULE | ORAL | Status: DC

## 2015-09-09 MED ORDER — ATENOLOL 25 MG PO TABS: ORAL_TABLET | ORAL | Status: DC

## 2015-09-09 MED ORDER — LEVOCETIRIZINE DIHYDROCHLORIDE 5 MG PO TABS: 2.5000 mg | ORAL_TABLET | Freq: Every evening | ORAL | Status: DC

## 2015-09-09 MED ORDER — CROMOLYN SODIUM 4 % OP SOLN: OPHTHALMIC | Status: DC

## 2015-09-09 NOTE — Patient Instructions (Signed)
Generalized Anxiety Disorder Generalized anxiety disorder (GAD) is a mental disorder. It interferes with life functions, including relationships, work, and school. GAD is different from normal anxiety, which everyone experiences at some point in their lives in response to specific life events and activities. Normal anxiety actually helps us prepare for and get through these life events and activities. Normal anxiety goes away after the event or activity is over.  GAD causes anxiety that is not necessarily related to specific events or activities. It also causes excess anxiety in proportion to specific events or activities. The anxiety associated with GAD is also difficult to control. GAD can vary from mild to severe. People with severe GAD can have intense waves of anxiety with physical symptoms (panic attacks).  SYMPTOMS The anxiety and worry associated with GAD are difficult to control. This anxiety and worry are related to many life events and activities and also occur more days than not for 6 months or longer. People with GAD also have three or more of the following symptoms (one or more in children):  Restlessness.   Fatigue.  Difficulty concentrating.   Irritability.  Muscle tension.  Difficulty sleeping or unsatisfying sleep. DIAGNOSIS GAD is diagnosed through an assessment by your health care provider. Your health care provider will ask you questions aboutyour mood,physical symptoms, and events in your life. Your health care provider may ask you about your medical history and use of alcohol or drugs, including prescription medicines. Your health care provider may also do a physical exam and blood tests. Certain medical conditions and the use of certain substances can cause symptoms similar to those associated with GAD. Your health care provider may refer you to a mental health specialist for further evaluation. TREATMENT The following therapies are usually used to treat GAD:    Medication. Antidepressant medication usually is prescribed for long-term daily control. Antianxiety medicines may be added in severe cases, especially when panic attacks occur.   Talk therapy (psychotherapy). Certain types of talk therapy can be helpful in treating GAD by providing support, education, and guidance. A form of talk therapy called cognitive behavioral therapy can teach you healthy ways to think about and react to daily life events and activities.  Stress managementtechniques. These include yoga, meditation, and exercise and can be very helpful when they are practiced regularly. A mental health specialist can help determine which treatment is best for you. Some people see improvement with one therapy. However, other people require a combination of therapies.   This information is not intended to replace advice given to you by your health care provider. Make sure you discuss any questions you have with your health care provider.   Document Released: 03/10/2013 Document Revised: 12/04/2014 Document Reviewed: 03/10/2013 Elsevier Interactive Patient Education 2016 Elsevier Inc.  - DASH Eating Plan DASH stands for "Dietary Approaches to Stop Hypertension." The DASH eating plan is a healthy eating plan that has been shown to reduce high blood pressure (hypertension). Additional health benefits may include reducing the risk of type 2 diabetes mellitus, heart disease, and stroke. The DASH eating plan may also help with weight loss. WHAT DO I NEED TO KNOW ABOUT THE DASH EATING PLAN? For the DASH eating plan, you will follow these general guidelines:  Choose foods with a percent daily value for sodium of less than 5% (as listed on the food label).  Use salt-free seasonings or herbs instead of table salt or sea salt.  Check with your health care provider or pharmacist before using   salt substitutes.  Eat lower-sodium products, often labeled as "lower sodium" or "no salt  added."  Eat fresh foods.  Eat more vegetables, fruits, and low-fat dairy products.  Choose whole grains. Look for the word "whole" as the first word in the ingredient list.  Choose fish and skinless chicken or turkey more often than red meat. Limit fish, poultry, and meat to 6 oz (170 g) each day.  Limit sweets, desserts, sugars, and sugary drinks.  Choose heart-healthy fats.  Limit cheese to 1 oz (28 g) per day.  Eat more home-cooked food and less restaurant, buffet, and fast food.  Limit fried foods.  Cook foods using methods other than frying.  Limit canned vegetables. If you do use them, rinse them well to decrease the sodium.  When eating at a restaurant, ask that your food be prepared with less salt, or no salt if possible. WHAT FOODS CAN I EAT? Seek help from a dietitian for individual calorie needs. Grains Whole grain or whole wheat bread. Brown rice. Whole grain or whole wheat pasta. Quinoa, bulgur, and whole grain cereals. Low-sodium cereals. Corn or whole wheat flour tortillas. Whole grain cornbread. Whole grain crackers. Low-sodium crackers. Vegetables Fresh or frozen vegetables (raw, steamed, roasted, or grilled). Low-sodium or reduced-sodium tomato and vegetable juices. Low-sodium or reduced-sodium tomato sauce and paste. Low-sodium or reduced-sodium canned vegetables.  Fruits All fresh, canned (in natural juice), or frozen fruits. Meat and Other Protein Products Ground beef (85% or leaner), grass-fed beef, or beef trimmed of fat. Skinless chicken or turkey. Ground chicken or turkey. Pork trimmed of fat. All fish and seafood. Eggs. Dried beans, peas, or lentils. Unsalted nuts and seeds. Unsalted canned beans. Dairy Low-fat dairy products, such as skim or 1% milk, 2% or reduced-fat cheeses, low-fat ricotta or cottage cheese, or plain low-fat yogurt. Low-sodium or reduced-sodium cheeses. Fats and Oils Tub margarines without trans fats. Light or reduced-fat  mayonnaise and salad dressings (reduced sodium). Avocado. Safflower, olive, or canola oils. Natural peanut or almond butter. Other Unsalted popcorn and pretzels. The items listed above may not be a complete list of recommended foods or beverages. Contact your dietitian for more options. WHAT FOODS ARE NOT RECOMMENDED? Grains White bread. White pasta. White rice. Refined cornbread. Bagels and croissants. Crackers that contain trans fat. Vegetables Creamed or fried vegetables. Vegetables in a cheese sauce. Regular canned vegetables. Regular canned tomato sauce and paste. Regular tomato and vegetable juices. Fruits Dried fruits. Canned fruit in light or heavy syrup. Fruit juice. Meat and Other Protein Products Fatty cuts of meat. Ribs, chicken wings, bacon, sausage, bologna, salami, chitterlings, fatback, hot dogs, bratwurst, and packaged luncheon meats. Salted nuts and seeds. Canned beans with salt. Dairy Whole or 2% milk, cream, half-and-half, and cream cheese. Whole-fat or sweetened yogurt. Full-fat cheeses or blue cheese. Nondairy creamers and whipped toppings. Processed cheese, cheese spreads, or cheese curds. Condiments Onion and garlic salt, seasoned salt, table salt, and sea salt. Canned and packaged gravies. Worcestershire sauce. Tartar sauce. Barbecue sauce. Teriyaki sauce. Soy sauce, including reduced sodium. Steak sauce. Fish sauce. Oyster sauce. Cocktail sauce. Horseradish. Ketchup and mustard. Meat flavorings and tenderizers. Bouillon cubes. Hot sauce. Tabasco sauce. Marinades. Taco seasonings. Relishes. Fats and Oils Butter, stick margarine, lard, shortening, ghee, and bacon fat. Coconut, palm kernel, or palm oils. Regular salad dressings. Other Pickles and olives. Salted popcorn and pretzels. The items listed above may not be a complete list of foods and beverages to avoid. Contact your dietitian for more information.   WHERE CAN I FIND MORE INFORMATION? National Heart, [employer], and  [employer] Institute: www.nhlbi.nih.gov/health/health-topics/topics/dash/   This information is not intended to replace advice given to you by your health care provider. Make sure you discuss any questions you have with your health care provider.   Document Released: 11/02/2011 Document Revised: 12/04/2014 Document Reviewed: 09/17/2013 Elsevier Interactive Patient Education 2016 Elsevier Inc.  

## 2015-09-09 NOTE — Telephone Encounter (Signed)
Refill request for Cromolyn eye drop. LOV 09/09/2015. Please advise.

## 2015-09-09 NOTE — Telephone Encounter (Signed)
Meds ordered this encounter  Medications  . cromolyn (OPTICROM) 4 % ophthalmic solution    Sig: PLACE 1 DROP INTO BOTH EYES 4 TIMES A DAY AS NEEDED    Dispense:  10 mL    Refill:  0    Please send next refills to patient's PCP,  Marthann SchillerMichelle Matthews, MD.  Thank you.

## 2015-09-09 NOTE — Telephone Encounter (Signed)
Patient called from [facility],( seeing Erin SonsLachina, NP today) upset that she can not get a refill on her Ativan. I offered to call Lachina for her to find out exactly what the issue is. I spoke with Vernona RiegerLaura, CMA for patient's new PCP and she told me that it was explained to the patient that Dr. Hyman HopesJegede has a new policy about controlled substances and although the Ativan is not a medication that can be stopped suddenly, it would be filled today. However going forward it was unclear and Vernona RiegerLaura stated that if [facility] is unable to fill it they would then decide as the patient's PCP another alternative. This medication was last filled by Dr. Marthann SchillerMichelle Matthews in June 2016.
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
## 2015-09-10 ENCOUNTER — Telehealth: Payer: Self-pay | Admitting: Family Medicine

## 2015-09-10 LAB — HEMOGLOBIN A1C
Hgb A1c MFr Bld: 6.3 % — ABNORMAL HIGH (ref <5.7)
MEAN PLASMA GLUCOSE: 134 mg/dL — AB (ref <117)

## 2015-09-10 NOTE — Telephone Encounter (Signed)
Tried to call patient. NO answer and no way to leave a voicemail. Thanks!

## 2015-09-10 NOTE — Telephone Encounter (Signed)
Reviewed labs. Hemoglobin a1C decreased from 6.5 to 6.3%. I will not re-start Metformin at this time. Recommend a lowfat, low carbohydrate diet divided over 5-6 small meals, increase water intake to 6-8 glasses, and 150 minutes per week of cardiovascular exercise. Triglycerides have also decreased. Continue all medications as prescribed. Patient to follow up in 6 months.    Massie MaroonHollis,Allie Ousley M, FNP

## 2015-09-13 NOTE — Telephone Encounter (Signed)
Called patient. No answer and no way leave a message. Will try later. Thanks!

## 2015-09-14 ENCOUNTER — Other Ambulatory Visit: Payer: Self-pay

## 2015-09-14 ENCOUNTER — Other Ambulatory Visit: Payer: Self-pay | Admitting: Family Medicine

## 2015-09-14 DIAGNOSIS — H1010 Acute atopic conjunctivitis, unspecified eye: Secondary | ICD-10-CM

## 2015-09-14 MED ORDER — CROMOLYN SODIUM 4 % OP SOLN: OPHTHALMIC | Status: DC

## 2015-09-14 NOTE — Telephone Encounter (Signed)
Spoke with patient today, advised of lab results and to work on controlling sugar with diet and exercise. Advised patient that provider is not going to re-start Metformin and patient needs to follow up in 6 months. Patient did not want to schedule appointment right now but states she will call back and schedule later. Thanks!

## 2015-09-16 ENCOUNTER — Other Ambulatory Visit: Payer: Self-pay | Admitting: Family Medicine

## 2015-09-16 ENCOUNTER — Encounter: Payer: Medicare PPO | Admitting: Family Medicine

## 2015-09-16 ENCOUNTER — Telehealth: Payer: Self-pay

## 2015-09-16 DIAGNOSIS — F4310 Post-traumatic stress disorder, unspecified: Secondary | ICD-10-CM

## 2015-09-16 DIAGNOSIS — F411 Generalized anxiety disorder: Secondary | ICD-10-CM

## 2015-09-16 DIAGNOSIS — F329 Major depressive disorder, single episode, unspecified: Secondary | ICD-10-CM

## 2015-09-16 DIAGNOSIS — F32A Depression, unspecified: Secondary | ICD-10-CM

## 2015-09-16 NOTE — Telephone Encounter (Signed)
Armeniahina,  Can you please put in referral for psych? Thanks!

## 2015-11-15 ENCOUNTER — Encounter: Payer: Self-pay | Admitting: Family Medicine

## 2015-12-02 ENCOUNTER — Ambulatory Visit: Payer: Medicare PPO | Admitting: Family Medicine

## 2015-12-07 ENCOUNTER — Telehealth: Payer: Self-pay | Admitting: Family Medicine

## 2015-12-07 DIAGNOSIS — H1010 Acute atopic conjunctivitis, unspecified eye: Secondary | ICD-10-CM

## 2015-12-07 MED ORDER — CROMOLYN SODIUM 4 % OP SOLN: OPHTHALMIC | Status: DC

## 2015-12-07 NOTE — Telephone Encounter (Signed)
REFERENCE # [id number] .  PHARMACIST'S EXT [id number]

## 2015-12-07 NOTE — Telephone Encounter (Signed)
Refill has been sent in via e-script. Thanks!

## 2016-01-07 ENCOUNTER — Other Ambulatory Visit: Payer: Self-pay | Admitting: Family Medicine

## 2016-01-07 DIAGNOSIS — I10 Essential (primary) hypertension: Secondary | ICD-10-CM

## 2016-01-07 DIAGNOSIS — H1010 Acute atopic conjunctivitis, unspecified eye: Secondary | ICD-10-CM

## 2016-01-07 MED ORDER — CROMOLYN SODIUM 4 % OP SOLN: OPHTHALMIC | Status: DC

## 2016-01-10 ENCOUNTER — Telehealth: Payer: Self-pay | Admitting: Internal Medicine

## 2016-01-10 ENCOUNTER — Other Ambulatory Visit: Payer: Medicare PPO

## 2016-01-10 NOTE — Telephone Encounter (Signed)
Patient has been e-mailed and know she has to come in for this, appointment has been scheduled for next Tuesday 01/18/2016. Thanks!

## 2016-01-18 ENCOUNTER — Ambulatory Visit: Payer: Medicare PPO | Admitting: Family Medicine

## 2016-01-24 ENCOUNTER — Ambulatory Visit: Payer: Medicare PPO | Admitting: Infectious Diseases

## 2016-02-04 ENCOUNTER — Other Ambulatory Visit: Payer: Medicare PPO

## 2016-02-04 ENCOUNTER — Ambulatory Visit (INDEPENDENT_AMBULATORY_CARE_PROVIDER_SITE_OTHER): Payer: Medicare PPO | Admitting: *Deleted

## 2016-02-04 ENCOUNTER — Other Ambulatory Visit (HOSPITAL_COMMUNITY)
Admission: RE | Admit: 2016-02-04 | Discharge: 2016-02-04 | Disposition: A | Discharge status: Home / Self Care | Payer: Medicare PPO | Source: Ambulatory Visit | Attending: Infectious Diseases | Admitting: Infectious Diseases

## 2016-02-04 DIAGNOSIS — Z124 Encounter for screening for malignant neoplasm of cervix: Secondary | ICD-10-CM | POA: Diagnosis not present

## 2016-02-04 DIAGNOSIS — Z01419 Encounter for gynecological examination (general) (routine) without abnormal findings: Secondary | ICD-10-CM | POA: Insufficient documentation

## 2016-02-04 DIAGNOSIS — Z111 Encounter for screening for respiratory tuberculosis: Secondary | ICD-10-CM | POA: Diagnosis not present

## 2016-02-04 DIAGNOSIS — B20 Human immunodeficiency virus [HIV] disease: Secondary | ICD-10-CM

## 2016-02-04 DIAGNOSIS — Z113 Encounter for screening for infections with a predominantly sexual mode of transmission: Secondary | ICD-10-CM | POA: Diagnosis not present

## 2016-02-04 LAB — CBC
HCT: 40.6 % (ref 36.0–46.0)
Hemoglobin: 13.7 g/dL (ref 12.0–15.0)
MCH: 31.7 pg (ref 26.0–34.0)
MCHC: 33.7 g/dL (ref 30.0–36.0)
MCV: 94 fL (ref 78.0–100.0)
MPV: 9.9 fL (ref 8.6–12.4)
PLATELETS: 320 10*3/uL (ref 150–400)
RBC: 4.32 MIL/uL (ref 3.87–5.11)
RDW: 13 % (ref 11.5–15.5)
WBC: 6.6 10*3/uL (ref 4.0–10.5)

## 2016-02-04 LAB — COMPLETE METABOLIC PANEL WITH GFR
ALBUMIN: 4.2 g/dL (ref 3.6–5.1)
ALK PHOS: 77 U/L (ref 33–130)
ALT: 26 U/L (ref 6–29)
AST: 22 U/L (ref 10–35)
BILIRUBIN TOTAL: 0.3 mg/dL (ref 0.2–1.2)
BUN: 12 mg/dL (ref 7–25)
CALCIUM: 9.5 mg/dL (ref 8.6–10.4)
CO2: 24 mmol/L (ref 20–31)
CREATININE: 0.62 mg/dL (ref 0.50–1.05)
Chloride: 106 mmol/L (ref 98–110)
GFR, Est African American: >89 mL/min (ref >=60)
GFR, Est Non African American: >89 mL/min (ref >=60)
GLUCOSE: 107 mg/dL — AB (ref 65–99)
Potassium: 4.6 mmol/L (ref 3.5–5.3)
SODIUM: 141 mmol/L (ref 135–146)
TOTAL PROTEIN: 7 g/dL (ref 6.1–8.1)

## 2016-02-04 LAB — LIPID PANEL
CHOLESTEROL: 295 mg/dL — AB (ref 125–200)
HDL: 71 mg/dL (ref >=46)
LDL Cholesterol: 178 mg/dL — ABNORMAL HIGH (ref <130)
Total CHOL/HDL Ratio: 4.2 Ratio (ref <=5.0)
Triglycerides: 228 mg/dL — ABNORMAL HIGH (ref <150)
VLDL: 46 mg/dL — ABNORMAL HIGH (ref <30)

## 2016-02-04 LAB — T-HELPER CELL (CD4) - (RCID CLINIC ONLY)
CD4 % Helper T Cell: 42 % (ref 33–55)
CD4 T CELL ABS: 1280 /uL (ref 400–2700)

## 2016-02-04 NOTE — Patient Instructions (Signed)
Your results will be ready in about a week.  I will mail them to you.  Please return on Monday to have your TB skin test read.  Thank you for coming to the Center for your care.  Angelique Blonderenise., RN

## 2016-02-04 NOTE — Progress Notes (Signed)
  Subjective:     Sheri Davis is a 55 y.o. woman who comes in today for a  pap smear only.  Previous abnormal Pap smears: no. Contraception: condoms  Objective:    There were no vitals taken for this visit. Pelvic Exam:  Pap smear obtained.   Assessment:    Screening pap smear.   Plan:    Follow up in one year, or as indicated by Pap results.  Pt given educational materials re: HIV and women, self-esteem, BSE, nutrition and diet management, PAP smears and partner safety. Pt given condoms

## 2016-02-05 LAB — RPR

## 2016-02-07 ENCOUNTER — Ambulatory Visit: Payer: Medicare PPO

## 2016-02-07 LAB — HIV-1 RNA QUANT-NO REFLEX-BLD

## 2016-02-07 LAB — CYTOLOGY - PAP

## 2016-02-07 LAB — CERVICOVAGINAL ANCILLARY ONLY
Chlamydia: NEGATIVE
Neisseria Gonorrhea: NEGATIVE

## 2016-02-11 ENCOUNTER — Ambulatory Visit: Payer: Medicare PPO

## 2016-02-18 ENCOUNTER — Ambulatory Visit (INDEPENDENT_AMBULATORY_CARE_PROVIDER_SITE_OTHER): Payer: Medicare PPO | Admitting: Family Medicine

## 2016-02-18 ENCOUNTER — Other Ambulatory Visit: Payer: Self-pay

## 2016-02-18 ENCOUNTER — Encounter: Payer: Self-pay | Admitting: Family Medicine

## 2016-02-18 VITALS — BP 122/81 | HR 69 | Temp 98.3°F | Resp 18 | Ht 63.0 in | Wt 232.0 lb

## 2016-02-18 DIAGNOSIS — K219 Gastro-esophageal reflux disease without esophagitis: Secondary | ICD-10-CM

## 2016-02-18 DIAGNOSIS — I10 Essential (primary) hypertension: Secondary | ICD-10-CM | POA: Diagnosis not present

## 2016-02-18 DIAGNOSIS — J302 Other seasonal allergic rhinitis: Secondary | ICD-10-CM

## 2016-02-18 DIAGNOSIS — R7303 Prediabetes: Secondary | ICD-10-CM

## 2016-02-18 DIAGNOSIS — B20 Human immunodeficiency virus [HIV] disease: Secondary | ICD-10-CM

## 2016-02-18 DIAGNOSIS — J309 Allergic rhinitis, unspecified: Secondary | ICD-10-CM

## 2016-02-18 DIAGNOSIS — F411 Generalized anxiety disorder: Secondary | ICD-10-CM | POA: Diagnosis not present

## 2016-02-18 LAB — HEMOGLOBIN A1C
HEMOGLOBIN A1C: 6.5 % — AB (ref <5.7)
Mean Plasma Glucose: 140 mg/dL — ABNORMAL HIGH (ref <117)

## 2016-02-18 LAB — POCT URINALYSIS DIP (DEVICE)
BILIRUBIN URINE: NEGATIVE
GLUCOSE, UA: NEGATIVE mg/dL
Ketones, ur: NEGATIVE mg/dL
LEUKOCYTES UA: NEGATIVE
NITRITE: NEGATIVE
Protein, ur: NEGATIVE mg/dL
UROBILINOGEN UA: 0.2 mg/dL (ref 0.0–1.0)
pH: 6 (ref 5.0–8.0)

## 2016-02-18 MED ORDER — FLUTICASONE PROPIONATE 50 MCG/ACT NA SUSP: NASAL | Status: DC

## 2016-02-18 MED ORDER — ATENOLOL 25 MG PO TABS: ORAL_TABLET | ORAL | Status: DC

## 2016-02-18 MED ORDER — LEVOCETIRIZINE DIHYDROCHLORIDE 5 MG PO TABS: 2.5000 mg | ORAL_TABLET | Freq: Every evening | ORAL | Status: DC

## 2016-02-18 MED ORDER — LANSOPRAZOLE 30 MG PO CPDR: DELAYED_RELEASE_CAPSULE | ORAL | Status: DC

## 2016-02-18 MED ORDER — LORAZEPAM 1 MG PO TABS: ORAL_TABLET | ORAL | Status: DC

## 2016-02-18 NOTE — Patient Instructions (Signed)
Generalized Anxiety Disorder Generalized anxiety disorder (GAD) is a mental disorder. It interferes with life functions, including relationships, work, and school. GAD is different from normal anxiety, which everyone experiences at some point in their lives in response to specific life events and activities. Normal anxiety actually helps [place] prepare for and get through these life events and activities. Normal anxiety goes away after the event or activity is over.  GAD causes anxiety that is not necessarily related to specific events or activities. It also causes excess anxiety in proportion to specific events or activities. The anxiety associated with GAD is also difficult to control. GAD can vary from mild to severe. People with severe GAD can have intense waves of anxiety with physical symptoms (panic attacks).  SYMPTOMS The anxiety and worry associated with GAD are difficult to control. This anxiety and worry are related to many life events and activities and also occur more days than not for 6 months or longer. People with GAD also have three or more of the following symptoms (one or more in children):  Restlessness.   Fatigue.  Difficulty concentrating.   Irritability.  Muscle tension.  Difficulty sleeping or unsatisfying sleep. DIAGNOSIS GAD is diagnosed through an assessment by your health care provider. Your health care provider will ask you questions aboutyour mood,physical symptoms, and events in your life. Your health care provider may ask you about your medical history and use of alcohol or drugs, including prescription medicines. Your health care provider may also do a physical exam and blood tests. Certain medical conditions and the use of certain substances can cause symptoms similar to those associated with GAD. Your health care provider may refer you to a mental health specialist for further evaluation. TREATMENT The following therapies are usually used to treat GAD:    Medication. Antidepressant medication usually is prescribed for long-term daily control. Antianxiety medicines may be added in severe cases, especially when panic attacks occur.   Talk therapy (psychotherapy). Certain types of talk therapy can be helpful in treating GAD by providing support, education, and guidance. A form of talk therapy called cognitive behavioral therapy can teach you healthy ways to think about and react to daily life events and activities.  Stress managementtechniques. These include yoga, meditation, and exercise and can be very helpful when they are practiced regularly. A mental health specialist can help determine which treatment is best for you. Some people see improvement with one therapy. However, other people require a combination of therapies.   This information is not intended to replace advice given to you by your health care provider. Make sure you discuss any questions you have with your health care provider.   Document Released: 03/10/2013 Document Revised: 12/04/2014 Document Reviewed: 03/10/2013 Elsevier Interactive Patient Education 2016 Elsevier Inc. High Cholesterol High cholesterol refers to having a high level of cholesterol in your blood. Cholesterol is a white, waxy, fat-like protein that your body needs in small amounts. Your liver makes all the cholesterol you need. Excess cholesterol comes from the food you eat. Cholesterol travels in your bloodstream through your blood vessels. If you have high cholesterol, deposits (plaque) may build up on the walls of your blood vessels. This makes the arteries narrower and stiffer. Plaque increases your risk of heart attack and stroke. Work with your health care provider to keep your cholesterol levels in a healthy range. RISK FACTORS Several things can make you more likely to have high cholesterol. These include:   Eating foods high in animal fat (  saturated fat) or cholesterol.  Being overweight.  Not  getting enough exercise.  Having a family history of high cholesterol. SIGNS AND SYMPTOMS High cholesterol does not cause symptoms. DIAGNOSIS  Your health care provider can do a blood test to check whether you have high cholesterol. If you are older than 20, your health care provider may check your cholesterol every 4-6 years. You may be checked more often if you already have high cholesterol or other risk factors for heart disease. The blood test for cholesterol measures the following:  Bad cholesterol (LDL cholesterol). This is the type of cholesterol that causes heart disease. This number should be less than 100.  Good cholesterol (HDL cholesterol). This type helps protect against heart disease. A healthy level of HDL cholesterol is 60 or higher.  Total cholesterol. This is the combined number of LDL cholesterol and HDL cholesterol. A healthy number is less than 200. TREATMENT  High cholesterol can be treated with diet changes, lifestyle changes, and medicine.   Diet changes may include eating more whole grains, fruits, vegetables, nuts, and fish. You may also have to cut back on red meat and foods with a lot of added sugar.  Lifestyle changes may include getting at least 40 minutes of aerobic exercise three times a week. Aerobic exercises include walking, biking, and swimming. Aerobic exercise along with a healthy diet can help you maintain a healthy weight. Lifestyle changes may also include quitting smoking.  If diet and lifestyle changes are not enough to lower your cholesterol, your health care provider may prescribe a statin medicine. This medicine has been shown to lower cholesterol and also lower the risk of heart disease. HOME CARE INSTRUCTIONS  Only take over-the-counter or prescription medicines as directed by your health care provider.   Follow a healthy diet as directed by your health care provider. For instance:   Eat chicken (without skin), fish, veal, shellfish, ground  [place] breast, and round or loin cuts of red meat.  Do not eat fried foods and fatty meats, such as hot dogs and salami.   Eat plenty of fruits, such as apples.   Eat plenty of vegetables, such as broccoli, potatoes, and carrots.   Eat beans, peas, and lentils.   Eat grains, such as barley, rice, couscous, and bulgur wheat.   Eat pasta without cream sauces.   Use skim or nonfat milk and low-fat or nonfat yogurt and cheeses. Do not eat or drink whole milk, cream, ice cream, egg yolks, and hard cheeses.   Do not eat stick margarine or tub margarines that contain trans fats (also called partially hydrogenated oils).   Do not eat cakes, cookies, crackers, or other baked goods that contain trans fats.   Do not eat saturated tropical oils, such as coconut and palm oil.   Exercise as directed by your health care provider. Increase your activity level with activities such as gardening or walking.   Keep all follow-up appointments.  SEEK MEDICAL CARE IF:  You are struggling to maintain a healthy diet or weight.  You need help starting an exercise program.  You need help to stop smoking. SEEK IMMEDIATE MEDICAL CARE IF:  You have chest pain.  You have trouble breathing.   This information is not intended to replace advice given to you by your health care provider. Make sure you discuss any questions you have with your health care provider.   Document Released: 11/13/2005 Document Revised: 12/04/2014 Document Reviewed: 09/05/2013 Elsevier Interactive Patient Education 2016  Elsevier Inc. High Cholesterol High cholesterol refers to having a high level of cholesterol in your blood. Cholesterol is a white, waxy, fat-like protein that your body needs in small amounts. Your liver makes all the cholesterol you need. Excess cholesterol comes from the food you eat. Cholesterol travels in your bloodstream through your blood vessels. If you have high cholesterol, deposits (plaque)  may build up on the walls of your blood vessels. This makes the arteries narrower and stiffer. Plaque increases your risk of heart attack and stroke. Work with your health care provider to keep your cholesterol levels in a healthy range. RISK FACTORS Several things can make you more likely to have high cholesterol. These include:   Eating foods high in animal fat (saturated fat) or cholesterol.  Being overweight.  Not getting enough exercise.  Having a family history of high cholesterol. SIGNS AND SYMPTOMS High cholesterol does not cause symptoms. DIAGNOSIS  Your health care provider can do a blood test to check whether you have high cholesterol. If you are older than 20, your health care provider may check your cholesterol every 4-6 years. You may be checked more often if you already have high cholesterol or other risk factors for heart disease. The blood test for cholesterol measures the following:  Bad cholesterol (LDL cholesterol). This is the type of cholesterol that causes heart disease. This number should be less than 100.  Good cholesterol (HDL cholesterol). This type helps protect against heart disease. A healthy level of HDL cholesterol is 60 or higher.  Total cholesterol. This is the combined number of LDL cholesterol and HDL cholesterol. A healthy number is less than 200. TREATMENT  High cholesterol can be treated with diet changes, lifestyle changes, and medicine.   Diet changes may include eating more whole grains, fruits, vegetables, nuts, and fish. You may also have to cut back on red meat and foods with a lot of added sugar.  Lifestyle changes may include getting at least 40 minutes of aerobic exercise three times a week. Aerobic exercises include walking, biking, and swimming. Aerobic exercise along with a healthy diet can help you maintain a healthy weight. Lifestyle changes may also include quitting smoking.  If diet and lifestyle changes are not enough to lower your  cholesterol, your health care provider may prescribe a statin medicine. This medicine has been shown to lower cholesterol and also lower the risk of heart disease. HOME CARE INSTRUCTIONS  Only take over-the-counter or prescription medicines as directed by your health care provider.   Follow a healthy diet as directed by your health care provider. For instance:   Eat chicken (without skin), fish, veal, shellfish, ground [place] breast, and round or loin cuts of red meat.  Do not eat fried foods and fatty meats, such as hot dogs and salami.   Eat plenty of fruits, such as apples.   Eat plenty of vegetables, such as broccoli, potatoes, and carrots.   Eat beans, peas, and lentils.   Eat grains, such as barley, rice, couscous, and bulgur wheat.   Eat pasta without cream sauces.   Use skim or nonfat milk and low-fat or nonfat yogurt and cheeses. Do not eat or drink whole milk, cream, ice cream, egg yolks, and hard cheeses.   Do not eat stick margarine or tub margarines that contain trans fats (also called partially hydrogenated oils).   Do not eat cakes, cookies, crackers, or other baked goods that contain trans fats.   Do not eat saturated tropical  oils, such as coconut and palm oil.   Exercise as directed by your health care provider. Increase your activity level with activities such as gardening or walking.   Keep all follow-up appointments.  SEEK MEDICAL CARE IF:  You are struggling to maintain a healthy diet or weight.  You need help starting an exercise program.  You need help to stop smoking. SEEK IMMEDIATE MEDICAL CARE IF:  You have chest pain.  You have trouble breathing.   This information is not intended to replace advice given to you by your health care provider. Make sure you discuss any questions you have with your health care provider.   Document Released: 11/13/2005 Document Revised: 12/04/2014 Document Reviewed: 09/05/2013 Elsevier Interactive  Patient Education 2016 Elsevier Inc. Fat and Cholesterol Restricted Diet High levels of fat and cholesterol in your blood may lead to various health problems, such as diseases of the heart, blood vessels, gallbladder, liver, and pancreas. Fats are concentrated sources of energy that come in various forms. Certain types of fat, including saturated fat, may be harmful in excess. Cholesterol is a substance needed by your body in small amounts. Your body makes all the cholesterol it needs. Excess cholesterol comes from the food you eat. When you have high levels of cholesterol and saturated fat in your blood, health problems can develop because the excess fat and cholesterol will gather along the walls of your blood vessels, causing them to narrow. Choosing the right foods will help you control your intake of fat and cholesterol. This will help keep the levels of these substances in your blood within normal limits and reduce your risk of disease. WHAT IS MY PLAN? Your health care provider recommends that you:  Get no more than __________ % of the total calories in your daily diet from fat.  Limit your intake of saturated fat to less than ______% of your total calories each day.  Limit the amount of cholesterol in your diet to less than _________mg per day. WHAT TYPES OF FAT SHOULD I CHOOSE?  Choose healthy fats more often. Choose monounsaturated and polyunsaturated fats, such as olive and canola oil, flaxseeds, walnuts, almonds, and seeds.  Eat more omega-3 fats. Good choices include salmon, mackerel, sardines, tuna, flaxseed oil, and ground flaxseeds. Aim to eat fish at least two times a week.  Limit saturated fats. Saturated fats are primarily found in animal products, such as meats, butter, and cream. Plant sources of saturated fats include palm oil, palm kernel oil, and coconut oil.  Avoid foods with partially hydrogenated oils in them. These contain trans fats. Examples of foods that contain  trans fats are stick margarine, some tub margarines, cookies, crackers, and other baked goods. WHAT GENERAL GUIDELINES DO I NEED TO FOLLOW? These guidelines for healthy eating will help you control your intake of fat and cholesterol:  Check food labels carefully to identify foods with trans fats or high amounts of saturated fat.  Fill one half of your plate with vegetables and green salads.  Fill one fourth of your plate with whole grains. Look for the word "whole" as the first word in the ingredient list.  Fill one fourth of your plate with lean protein foods.  Limit fruit to two servings a day. Choose fruit instead of juice.  Eat more foods that contain soluble fiber. Examples of foods that contain this type of fiber are apples, broccoli, carrots, beans, peas, and barley. Aim to get 20-30 g of fiber per day.  Eat more  home-cooked food and less restaurant, buffet, and fast food.  Limit or avoid alcohol.  Limit foods high in starch and sugar.  Limit fried foods.  Cook foods using methods other than frying. Baking, boiling, grilling, and broiling are all great options.  Lose weight if you are overweight. Losing just 5-10% of your initial body weight can help your overall health and prevent diseases such as diabetes and heart disease. WHAT FOODS CAN I EAT? Grains Whole grains, such as whole wheat or whole grain breads, crackers, cereals, and pasta. Unsweetened oatmeal, bulgur, barley, quinoa, or brown rice. Corn or whole wheat flour tortillas. Vegetables Fresh or frozen vegetables (raw, steamed, roasted, or grilled). Green salads. Fruits All fresh, canned (in natural juice), or frozen fruits. Meat and Other Protein Products Ground beef (85% or leaner), grass-fed beef, or beef trimmed of fat. Skinless chicken or [place]. Ground chicken or [place]. Pork trimmed of fat. All fish and seafood. Eggs. Dried beans, peas, or lentils. Unsalted nuts or seeds. Unsalted canned or dry  beans. Dairy Low-fat dairy products, such as skim or 1% milk, 2% or reduced-fat cheeses, low-fat ricotta or cottage cheese, or plain low-fat yogurt. Fats and Oils Tub margarines without trans fats. Light or reduced-fat mayonnaise and salad dressings. Avocado. Olive, canola, sesame, or safflower oils. Natural peanut or almond butter (choose ones without added sugar and oil). The items listed above may not be a complete list of recommended foods or beverages. Contact your dietitian for more options. WHAT FOODS ARE NOT RECOMMENDED? Grains White bread. White pasta. White rice. Cornbread. Bagels, pastries, and croissants. Crackers that contain trans fat. Vegetables White potatoes. Corn. Creamed or fried vegetables. Vegetables in a cheese sauce. Fruits Dried fruits. Canned fruit in light or heavy syrup. Fruit juice. Meat and Other Protein Products Fatty cuts of meat. Ribs, chicken wings, bacon, sausage, bologna, salami, chitterlings, fatback, hot dogs, bratwurst, and packaged luncheon meats. Liver and organ meats. Dairy Whole or 2% milk, cream, half-and-half, and cream cheese. Whole milk cheeses. Whole-fat or sweetened yogurt. Full-fat cheeses. Nondairy creamers and whipped toppings. Processed cheese, cheese spreads, or cheese curds. Sweets and Desserts Corn syrup, sugars, honey, and molasses. Candy. Jam and jelly. Syrup. Sweetened cereals. Cookies, pies, cakes, donuts, muffins, and ice cream. Fats and Oils Butter, stick margarine, lard, shortening, ghee, or bacon fat. Coconut, palm kernel, or palm oils. Beverages Alcohol. Sweetened drinks (such as sodas, lemonade, and fruit drinks or punches). The items listed above may not be a complete list of foods and beverages to avoid. Contact your dietitian for more information.   This information is not intended to replace advice given to you by your health care provider. Make sure you discuss any questions you have with your health care provider.    Document Released: 11/13/2005 Document Revised: 12/04/2014 Document Reviewed: 02/11/2014 Elsevier Interactive Patient Education [employer]

## 2016-02-18 NOTE — Progress Notes (Signed)
Subjective:    Patient ID: Sheri Davis, female    DOB: 07/19/1961, 55 y.o.   MRN: [id number]  HPI Sheri Davis is a 55 year old female with a history of hypertension, prediabetes, and hyperlipidemia presents for a 3 month follow up of hypertension, prediabetes and anxiety.  Ms. Amada Davis is here for follow up of hypertension and hyperlipidemia.  She is not exercising and is not adherent to low salt diet.  Patient denies dizziness,  chest pain, dyspnea, fatigue, lower extremity edema, orthopnea, palpitations, syncope and tachypnea.  Cardiovascular risk factors: dyslipidemia, obesity (BMI >= 30 kg/m2) and sedentary lifestyle.   Patient is also following up on prediabetes. Last hemoglobin A1C was 6.3%. Patient was not interested in starting medications at that point, so we discussed diet and exercise regimen at length. She has not been following a carbohydrate diet or exercise regimen. Patient denies foot ulcerations, increase appetite, nausea, paresthesia of the feet, polydipsia, polyuria, visual disturbances and weight loss.    Patient is also complaining of anxiety..  She has the following symptoms: difficulty concentrating, irritable, racing thoughts. She reports that she has post traumatic stress syndrome. She also reports a history of becoming anxious in when approaching new situations.  She denies current suicidal and homicidal ideation.  Previous treatment includes Ativan, BuSpar and Prozac. She reports that she was on Seraquel in the past, which caused her to sleep too much. She states that [facility].   Past Medical History  Diagnosis Date  . HIV infection (HCC)   . Hyperlipidemia   . GERD (gastroesophageal reflux disease)   . Asthma   . Allergy   . Tachycardia y-12    Pt was under stress at the time   Social History   Social History  . Marital Status: Single   Spouse Name: N/A  . Number of Children: N/A  . Years of Education: N/A   Occupational History  . Not on file.   Social History Main Topics  . Smoking status: Never Smoker   . Smokeless tobacco: Never Used  . Alcohol Use: Yes     Comment: occasional  . Drug Use: No  . Sexual Activity: No     Comment: pt. declined condoms   Other Topics Concern  . Not on file   Social History Narrative   Immunization History  Administered Date(s) Administered  . Influenza Split 09/08/2011, 08/14/2012  . Influenza Whole 08/28/2007, 09/09/2007, 09/14/2008, 09/24/2009, 09/12/2010  . Influenza,inj,Quad PF,36+ Mos 08/26/2014, 08/18/2015  . Influenza-Unspecified 08/25/2013  . PPD Test 01/29/2015, 02/04/2016  . Pneumococcal Conjugate-13 04/29/2015  . Pneumococcal Polysaccharide-23 08/28/2007, 01/29/2012  . Tdap 08/26/2014   Review of Systems  Constitutional: Negative.  Negative for fever, fatigue and unexpected weight change.  HENT: Positive for postnasal drip. Negative for congestion.   Eyes: Positive for itching.  Respiratory: Negative.   Cardiovascular: Negative.   Gastrointestinal: Negative.   Endocrine: Negative.  Negative for polydipsia, polyphagia and polyuria.  Genitourinary: Negative.   Musculoskeletal: Negative.   Skin: Negative.   Allergic/Immunologic: Negative.   Neurological: Negative.  Negative for dizziness.  Hematological: Negative.   Psychiatric/Behavioral: The patient is nervous/anxious.        Objective:   Physical Exam  Constitutional: She is oriented to person, place, and time. She appears well-developed and well-nourished.  HENT:  Head: Normocephalic and atraumatic.  Right Ear: Hearing, tympanic membrane, external ear and ear  canal normal.  Left Ear: Hearing, tympanic membrane, external ear and ear canal normal.  Nose: Mucosal edema (mucosa boggy) present. Right sinus exhibits no maxillary sinus tenderness and no frontal sinus tenderness. Left sinus exhibits no  frontal sinus tenderness.  Mouth/Throat: Uvula is midline and oropharynx is clear and moist.  Eyes: Conjunctivae, EOM and lids are normal. Pupils are equal, round, and reactive to light. Lids are everted and swept, no foreign bodies found.  Neck: Trachea normal and normal range of motion. Neck supple. No thyroid mass and no thyromegaly present.  Cardiovascular: Normal rate, regular rhythm, normal heart sounds and intact distal pulses.   Pulmonary/Chest: Effort normal and breath sounds normal.  Abdominal: Soft. Bowel sounds are normal. She exhibits no distension (obesity). There is no tenderness.  Musculoskeletal: Normal range of motion.  Neurological: She is alert and oriented to person, place, and time. She has normal reflexes.  Skin: Skin is warm and dry.  Psychiatric: Her speech is normal and behavior is normal. Judgment and thought content normal. Her mood appears anxious. Her affect is angry. She expresses no suicidal ideation. She expresses no suicidal plans.         BP 122/81 mmHg  Pulse 69  Temp(Src) 98.3 F (36.8 C) (Oral)  Resp 18  Ht  (1.6 m)  Wt 232 lb (105.235 kg)  BMI 41.11 kg/m2  SpO2 98% Assessment & Plan:  1. Essential hypertension Blood pressure is at goal on current medication regimen. Will continue medication at previous dosage - atenolol (TENORMIN) 25 MG tablet; TAKE 1 TABLET ( ) BY MOUTH DAILY. REFERENCE# :[id number]  Dispense: 90 tablet; Refill: 1 - POCT urinalysis dipstick  2. Anxiety state Anxiety controlled on Lorazepam. Patient denies any suicidal or homicidal ideations. Recommend follow up at [facility] for anxiety.  - LORazepam (ATIVAN) 1 MG tablet; ONE TABLET EVERY 8 HOURS AS NEEDED  Dispense: 60 tablet; Refill: 0  3. Human immunodeficiency virus (HIV) disease (HCC) Patient to follow up with Dr. Ninetta Lights as scheduled  4. Prediabetes - Hemoglobin A1c - Glucose (CBG)  5. Seasonal allergies - levocetirizine (XYZAL) 5 MG tablet;  Take 0.5 tablets (2.5 mg total) by mouth every evening.  Dispense: 30 tablet; Refill: 5  6. Gastroesophageal reflux disease without esophagitis - lansoprazole (PREVACID) 30 MG capsule; TAKE ONE (1) CAPSULE EACH DAY AT NOON  Dispense: 90 capsule; Refill: 1  7. Allergic rhinitis, unspecified allergic rhinitis type - fluticasone (FLONASE) 50 MCG/ACT nasal spray; USE ONE SPRAY IN EACH NOSTRIL ONCE A DAY  Dispense: 16 g; Refill: 1     Routine Health Maintenance:  Will repeat fasting lipid panel. She states that she had a full meal prior to previous lab.  Pap smear up to date   Massie Maroon, FNP

## 2016-02-18 NOTE — Progress Notes (Signed)
Patient is here for anxiety FU  Patient is requesting refills on medications.  Patient denies pain at this time.

## 2016-02-21 ENCOUNTER — Other Ambulatory Visit: Payer: Self-pay | Admitting: Family Medicine

## 2016-02-21 ENCOUNTER — Ambulatory Visit: Payer: Medicare PPO | Admitting: Infectious Diseases

## 2016-02-21 DIAGNOSIS — E119 Type 2 diabetes mellitus without complications: Secondary | ICD-10-CM

## 2016-02-21 MED ORDER — METFORMIN HCL 500 MG PO TABS: 500.0000 mg | ORAL_TABLET | Freq: Two times a day (BID) | ORAL | Status: DC

## 2016-02-21 NOTE — Progress Notes (Signed)
Reviewed labs. Hemoglobin a1C has increased to 6.5. Recommend that patient re-start Metformin 500 mg BID. Recommend a lowfat, low carbohydrate diet divided over 5-6 small meals, increase water intake to 6-8 glasses, and 150 minutes per week of cardiovascular exercise.   Meds ordered this encounter  Medications  . metFORMIN (GLUCOPHAGE) 500 MG tablet    Sig: Take 1 tablet (500 mg total) by mouth 2 (two) times daily with a meal.    Dispense:  60 tablet    Refill:  2    Karnell Vanderloop M, FNP

## 2016-02-22 ENCOUNTER — Other Ambulatory Visit: Payer: Self-pay

## 2016-02-22 DIAGNOSIS — E119 Type 2 diabetes mellitus without complications: Secondary | ICD-10-CM

## 2016-02-22 MED ORDER — METFORMIN HCL 500 MG PO TABS: 500.0000 mg | ORAL_TABLET | Freq: Two times a day (BID) | ORAL | Status: DC

## 2016-02-22 NOTE — Telephone Encounter (Signed)
Refilled medication to correct pharmacy. Thanks!

## 2016-02-22 NOTE — Progress Notes (Signed)
Spoke with patient and advised of increase in hemoglobin a1c and that she needs to start on metformin 500mg  twice daily. Advised patient of diet recommendations and exercised recommendations. Also refilled medication to correct pharmacy. Thanks!

## 2016-02-23 ENCOUNTER — Ambulatory Visit: Payer: Medicare PPO | Admitting: Infectious Diseases

## 2016-02-25 ENCOUNTER — Encounter: Payer: Self-pay | Admitting: *Deleted

## 2016-02-29 ENCOUNTER — Other Ambulatory Visit: Payer: Self-pay

## 2016-02-29 NOTE — Patient Outreach (Signed)
[facility](THN) Care Management  02/29/2016  Sheri Davis 10/04/1961 [id number]   Telephone Screen  Referral Date: 02/24/16 Referral Source: [employer] PPO high risk list Referral Reason: "high risk patient-needs [employer] CM program or [facility] services"   Outreach attempt #1 to patient. No answer at present and unable to leave voicemail message.    Plan: RN CM will make outreach attempt to patient within a week.  Antionette Fairyoshanda Ledger Heindl, RN,BSN,CCM [facility] Care Management Telephonic Care Management Coordinator Direct Phone: [phone] Toll Free: [phone] Fax: [phone]

## 2016-03-01 ENCOUNTER — Telehealth: Payer: Self-pay

## 2016-03-01 ENCOUNTER — Other Ambulatory Visit: Payer: Self-pay

## 2016-03-01 DIAGNOSIS — H1010 Acute atopic conjunctivitis, unspecified eye: Secondary | ICD-10-CM

## 2016-03-01 DIAGNOSIS — R7303 Prediabetes: Secondary | ICD-10-CM

## 2016-03-01 MED ORDER — CROMOLYN SODIUM 4 % OP SOLN: OPHTHALMIC | Status: DC

## 2016-03-01 NOTE — Patient Outreach (Signed)
[facility](THN) Care Management  03/01/2016  Sheri Davis 09/01/1961 [id number]   Telephone Screen  Referral Date: 02/24/16 Referral Source: [employer] PPO high risk list Referral Reason: "high risk patient-needs [employer] CM program or [facility] services"    Outreach attempt # 2 to patient. Spoke with patient. Screening completed.  Social: Patient resides in her home alone. She states that she is independent with ADLs/IADLs. Denies any recent falls. No DME in the home. She drives herself to appointments.  Conditions: Patient has h/o: HTN, PTSD, HLD, anxiety, depression, pre-diabetic and HIV. She does not monitor BP or blood sugars in the home. She voices that she was told her BP was "doing great." However, she states that her A1C has gone up within the past few months and is 6.3. She was recently started back on Metformin. Patient states she needs further assistance with managing diabetes and adhering to diet limitations. She reports that she needs help getting connected with mental health services for her anxiety,depression and PTSD. Patient states that although she [place] in [place] would [place] services to protect her medical info and privacy due to her illnesses.    Medications: Patient reports taking about 8-9 meds. She states she is having trouble paying for her meds. She is currently not out of any meds.   Consent: Patient gave verbal consent for [facility] services. Patient states that she prefers and requests EARLY MORNING CALLS (8:30am-10am) from [facility] staff.   Plan: RN CM will notify [facility] administrative assistant of case status. RN CM will send [facility] RN health coach referral for disease management. RN CM will send [facility] SW referral for financial assessment and mental health resources. RN CM will send [facility] pharmacy referral for possible med assistance. RN CM provided patient with 24hr Nurse Line contact info.   Antionette Fairyoshanda Taylah Dubiel,  RN,BSN,CCM [facility] Care Management Telephonic Care Management Coordinator Direct Phone: [phone] Toll Free: [phone] Fax: [phone]

## 2016-03-01 NOTE — Telephone Encounter (Signed)
Refill for eye drop sent into pharmacy. Thanks!

## 2016-03-01 NOTE — Telephone Encounter (Signed)
Pt called and requested a medication refill on the cromolyn eye drops. Thanks!

## 2016-03-02 ENCOUNTER — Other Ambulatory Visit: Payer: Self-pay | Admitting: Family Medicine

## 2016-03-06 ENCOUNTER — Other Ambulatory Visit: Payer: Self-pay | Admitting: *Deleted

## 2016-03-06 NOTE — Patient Outreach (Signed)
[facility](THN) Care Management  03/06/2016  Sheri Davis 02/05/1961 [id number]  Phone call to patient to provide mental health resources and resource information related to financial assistance,  Phone rang, no message able to be left.  This Child psychotherapistsocial worker will call back within 1 week.   Sheri ReamsChrystal Jowanna Loeffler, LCSW [facility] Care Management [phone]

## 2016-03-07 ENCOUNTER — Telehealth: Payer: Self-pay

## 2016-03-07 DIAGNOSIS — H1010 Acute atopic conjunctivitis, unspecified eye: Secondary | ICD-10-CM

## 2016-03-07 MED ORDER — CROMOLYN SODIUM 4 % OP SOLN
OPHTHALMIC | Status: DC
Start: 2016-03-07 — End: 2016-05-02

## 2016-03-07 NOTE — Telephone Encounter (Signed)
This has been sent into pharmacy. Thanks!  

## 2016-03-07 NOTE — Telephone Encounter (Signed)
Pt needs a medication refill on Chromolyn faxed to [facility]. Fax #[phone]. Please include reference [id number]. Phone number to [facility] is [phone]. Thanks!

## 2016-03-14 ENCOUNTER — Ambulatory Visit: Payer: Self-pay | Admitting: *Deleted

## 2016-03-15 ENCOUNTER — Ambulatory Visit: Payer: Self-pay | Admitting: *Deleted

## 2016-03-17 ENCOUNTER — Other Ambulatory Visit: Payer: Self-pay | Admitting: *Deleted

## 2016-03-17 ENCOUNTER — Encounter: Payer: Self-pay | Admitting: *Deleted

## 2016-03-17 ENCOUNTER — Ambulatory Visit: Payer: Medicare PPO | Admitting: Family Medicine

## 2016-03-17 DIAGNOSIS — I10 Essential (primary) hypertension: Secondary | ICD-10-CM

## 2016-03-17 NOTE — Patient Outreach (Signed)
03/17/16- Telephone call to patient for telephonic outreach for RN health coach, spoke with pt, HIPAA verified, pt reports she is prediabetic and "wants to work on my diet, I would like a nurse to come here to my house and help explain it"  Pt states she has blood pressure cuff but does not check blood pressure as she needs to get batteries which pt intends to do within next week and start monitoring blood pressure 3 times per week.  Last Hgb AIC per pt is 6.5 up from 6.3.  Pt states she has multiple social work needs such as Environmental consultantfinancial issues, assistance with mental health issues (anxiety, PTSD) completing advance directives, RN CM sent email and in basket to [employer] [place] CSW and ask that she contact pt between 830-10 am per pt request.  RN CM sent order for community RN.  RN CM faxed barrier letter to primary provider Julianne HandlerLachina Hollis NP.  THN CM Care Plan Problem One        Most Recent Value   Care Plan Problem One  Knowledge deficit related to carbohydrate modified diet   Role Documenting the Problem One  Care Management Coordinator   Care Plan for Problem One  Active   THN Long Term Goal (31-90 days)  pt will choose foods consistent with carbohydrate modified diet within 60 days    THN Long Term Goal Start Date  03/17/16   Interventions for Problem One Long Term Goal  RN CM reviewed foods high in carbohydrates that will elevate blood sugar, reviewed importance of 3 meals daily plus snacks, adequate protein in diet, referral placed for community RN per pt request for diet teaching.   THN CM Short Term Goal #1 (0-30 days)  Pt will verbalize how many carbohydrates she can have at each meal as well as plate method.   THN CM Short Term Goal #1 Start Date  03/17/16   Interventions for Short Term Goal #1  RN CM reviewed having 2-3 carbohydrate choices per meal, pt eager to work with community RN regarding diet teaching. RN CM had Littie DeedsMicheal Reeves in office to mail diabetic booklet and carbohydate counting poster  to patient's home so she can review.    [facility] CM Care Plan Problem Two        Most Recent Value   Care Plan Problem Two  Knowledge deficit related to hypertension   Role Documenting the Problem Two  Care Management Coordinator   Care Plan for Problem Two  Active   THN CM Short Term Goal #1 (0-30 days)  pt will obtain batteries for her blood pressure cuff and check blood pressure 3 times per week and log within 30 days.   THN CM Short Term Goal #1 Start Date  03/17/16   Interventions for Short Term Goal #2   RN CM ask pt to start checking blood pressure and logging- this is goal pt wants to work towards because she is not sure what her blood pressure readings are until she sees MD.      Careers information officerLAN Community RN will contact pt Collaborate with CSW as needed Diet teaching (prediabetes) Pt to monitor blood pressure  Irving ShowsJulie Alys Dulak [facility], BSN [facility] Care Coordinator [phone]

## 2016-03-20 ENCOUNTER — Other Ambulatory Visit: Payer: Self-pay | Admitting: *Deleted

## 2016-03-20 ENCOUNTER — Other Ambulatory Visit: Payer: Self-pay | Admitting: Pharmacist

## 2016-03-20 ENCOUNTER — Ambulatory Visit: Payer: Medicare PPO | Admitting: Pharmacist

## 2016-03-20 NOTE — Patient Outreach (Signed)
[facility](THN) Care Management  [facility]   03/20/2016  Anderia Cherrie DistanceR [place] 09/02/1961 [id number]  Subjective: Sheri Davis is a 55yo who [facility].  Per referral, patient reports some financial difficulty affording medications at times.    I received a return phone call from patient.  Patient requests that all phone calls are made to her home phone and not her mobile phone number.    I verified patient's name and date of birth.  Explained the purpose of the phone call and reviewed medications as well as discussed medication assistance with patient.  Patient confirms she has all her medications at this time.  Patient reports she sometimes has difficulty with medication copays.    Objective:   Encounter Medications: Outpatient Encounter Prescriptions as of 03/20/2016  Medication Sig  . albuterol (VENTOLIN HFA) 108 (90 BASE) MCG/ACT inhaler INHALE ONE PUFF INTO LUNGS EVERY FOUR HOURS AS NEEDED FOR SHORTNESS OF BREATH  . atenolol (TENORMIN) 25 MG tablet TAKE 1 TABLET (25MG ) BY MOUTH DAILY. REFERENCE# :[id number]  . cromolyn (OPTICROM) 4 % ophthalmic solution PLACE 1 DROP INTO BOTH EYES 4 TIMES A DAY AS NEEDED.reference #[id number]  . efavirenz-emtricitabine-tenofovir (ATRIPLA) 600-200-300 MG tablet Take 1 tablet by mouth at bedtime.  . fluticasone (FLONASE) 50 MCG/ACT nasal spray USE ONE SPRAY IN EACH NOSTRIL ONCE A DAY  . fluticasone (FLOVENT HFA) 110 MCG/ACT inhaler Inhale 1 puff into the lungs daily as needed (for shortness of breath). (Patient taking differently: Inhale 1 puff into the lungs daily. Pt using as needed.)  . lansoprazole (PREVACID) 30 MG capsule TAKE ONE (1) CAPSULE EACH DAY AT NOON  . levocetirizine (XYZAL) 5 MG tablet Take 0.5 tablets (2.5 mg total) by mouth every evening.  Marland Kitchen. LORazepam (ATIVAN) 1 MG tablet ONE TABLET EVERY 8 HOURS AS NEEDED  . metFORMIN (GLUCOPHAGE) 500 MG tablet Take 1 tablet (500 mg  total) by mouth 2 (two) times daily with a meal.  . triamcinolone cream (KENALOG) 0.1 % APPLY TO AFFECTED AREA TWICE A DAY AS NEEDED   No facility-administered encounter medications on file as of 03/20/2016.   Functional Status: In your present state of health, do you have any difficulty performing the following activities: 03/17/2016 09/09/2015  Hearing? N N  Vision? N N  Difficulty concentrating or making decisions? N N  Walking or climbing stairs? N N  Dressing or bathing? N N  Doing errands, shopping? N N  Preparing Food and eating ? N -  Using the Toilet? N -  In the past six months, have you accidently leaked urine? N -  Do you have problems with loss of bowel control? N -  Managing your Medications? N -  Managing your Finances? N -  Housekeeping or managing your Housekeeping? N -   Fall/Depression Screening: PHQ 2/9 Scores 03/17/2016 03/01/2016 09/09/2015 08/18/2015 04/29/2015 02/26/2015 11/17/2014  PHQ - 2 Score 1 1 0 0 0 0 0    Assessment: 1.  Medication review:   Drugs sorted by system:  Neurologic/Psychologic: lorazepam  Cardiovascular: atenolol  Pulmonary/Allergy: albuterol HFA, fluticasone nasal spray, Flovent HFA, levocetirizine  Gastrointestinal: lansoprazole  Endocrine: metformin   Topical: cromolyn solution, triamcinolone cream  Infectious Diseases: Atripla   Duplications in therapy: none noted Gaps in therapy:  - Based on the American Heart Association estimate 10-year risk for atherosclerotic cardiovascular disease (ASCVD) calculator, patient has a calculated 10-year risk of 9.8%.  Based on this risk estimate and diagnosis  of diabetes, recommend a high-intensity statin for this patient.  Note that Atripla interacts with atorvastatin; therefore would recommend using rosuvastatin.   - ACE inhibitor or ARB is recommended in nonpregnant patients with diabetes and hypertension with modestly elevated urinary albumin-to-creatinine ratio (30-299).  Urinary albumin  should be assessed at least once a year.  Patient's most recent urinary albumin was 4.5 on 02/26/15.   Medications to avoid in the elderly: N/A, patient <65yo Drug interactions: none noted Other issues noted:  - Metformin:  Patient reports taking 500 mg daily instead of BID as prescribed as she is unable to tolerate BID dosing.   - Flovent HFA:  Patient reports using as needed.    2.  Medication assistance:  Briefly discussed options for medication assistance.  Patient reports she has not applied for Extra Help before.  Patient reports she does not have time to complete the application today.  I made an appointment to call patient on 03/27/16 at 8:30 AM to complete Extra Help application.    Plan: 1.  Based on patient's estimate 10-year risk for ASCVD of 9.8%, will include in my letter to patient's PCP to recommend use of a high intensity statin, such as rosuvastatin 20 or 40 mg daily. 2.  Please consider ordering update urinary albumin and initiating ACE inhibitor or ARB if urinary albumin-to-creatinine ratio is > 30. 3.  Metformin:  I identified that patient is unable to tolerate more than metformin hcl 500 mg daily due to GI upset.  Reviewed purpose, proper use, and adverse effects of metformin including GI upset.  Patient confirms she is taking the medication with a meal.  I will send a letter to patient's PCP to request trial of metformin hcl XR formulation in place of metformin hcl.  4.  Flovent HFA:  I counseled patient on purpose, proper use, and adverse effects of Flovent HFA.  Reviewed maintenance versus rescue inhalers and advised patient to use Flovent every day.   5.  Phone call scheduled for 03/27/16 at 8:30 AM to complete Extra Help application with patient.    THN CM Care Plan Problem One        Most Recent Value   Care Plan Problem One  Medication cost   Role Documenting the Problem One  Clinical Pharmacist   Care Plan for Problem One  Active   THN CM Short Term Goal #1 (0-30 days)   Patient will compelte Extra Help application in the next 30 days.    THN CM Short Term Goal #1 Start Date  03/20/16   Interventions for Short Term Goal #1  Discussed options for medication assistance.  Informed patient informationt that would be needed to compelte application.  Set appointment to complete application over the phone on 03/27/16.      Lilla Shook, Pharm.D. Pharmacy Resident [facility] [phone]

## 2016-03-20 NOTE — Patient Outreach (Signed)
[facility](THN) Care Management  03/20/2016  Jaimarie Cherrie DistanceR [place] 11/16/1961 [id number]   Avalynn Amada JupiterKirkpatrick is a 55yo who [facility] assistance.  Per referral, patient reports some financial difficulty affording medications at times.  I made outreach call to patient to assess medication assistance needs.  There was no answer.  I left a HIPAA complaint voicemail for patient to return my phone call.  I will make a second outreach attempt within one week if patient does not return my phone call.    Lilla Shookachel Henderson, Pharm.D. Pharmacy Resident [facility] [phone]

## 2016-03-20 NOTE — Patient Outreach (Signed)
[facility](THN) Care Management  03/20/2016  Kyliee Cherrie DistanceR [place] 04/06/1961 [id number]   Patient was referred to this social worker to assist with financial and mental health needs.  Patient also requesting assistance with completing her Advanced Directive.  Phone call made to patient.  Patient discussed being on disability and having [employer].  She discussed previously being seen at a mental health agency in the [place] area, however due to funding and confidentiality reasons would like to be seen outside of the [place] area.  Per patient she [place] in [place] due to confidentialtiy reasons.  This Child psychotherapistsocial worker began to ask questions regarding previous providers seen to better understand patient's needs.  Patient became agitated with the questions, stating that she just got off [facility] asking several questions.  Per patient "I am going to get off the phone right now before I become rude to you"  "I will call you next week".   This Child psychotherapistsocial worker gave patient my contact information and will expect a call in 1 week.  If this social worker does not hear from patient in 1 week, this social worker will contact patient.    Adriana ReamsChrystal Anndrea Mihelich, LCSW [facility] Care Management [phone]

## 2016-03-27 ENCOUNTER — Other Ambulatory Visit: Payer: Self-pay | Admitting: Pharmacist

## 2016-03-27 NOTE — Patient Outreach (Signed)
[facility](THN) Care Management  03/27/2016  Anjali Cherrie DistanceR Faraj 07/12/1961 [id number]   Sheri Davis is a 55yo who [facility] medication [facility].  Per referral, patient reports some financial difficulty affording medications at times.    I made outreach call to patient today to complete application for Extra Help at scheduled time.  Phone call made to patient's home phone per patient's request.  There was no answer, and no option to leave a voicemail.  I will make another outreach attempt within one week if patient does not return my phone call.     Lilla Shookachel Azjah Pardo, Pharm.D. Pharmacy Resident [facility] [phone]

## 2016-03-28 ENCOUNTER — Other Ambulatory Visit: Payer: Self-pay | Admitting: *Deleted

## 2016-03-28 NOTE — Patient Outreach (Signed)
Attempt made to contact pt, f[facility]  referral for community nurse case management services.  Per referral, pt is pre diabetic, requesting nurse to come to home, wants help with diet teaching.   Called pt's home phone, no answer and no option to leave a voice message.  Will try again.    Shayne Alkenose M.   Pierzchala RN CCM [facility] Care Management  [phone]

## 2016-03-29 ENCOUNTER — Other Ambulatory Visit: Payer: Self-pay | Admitting: Pharmacist

## 2016-03-29 NOTE — Patient Outreach (Signed)
[facility](THN) Care Management  03/29/2016  Sheri Davis 03/18/1961 [id number]   Sheri Davis is a 55yo who [facility].  Per referral, patient reports some financial difficulty affording medications at times.    I made outreach call to patient today to complete application for Extra Help.  Phone call made to patient's home phone between the hours of 8:30 AM and 10:00 AM as requested by patient.  Patient answered but reports she is unable to talk at this time as she is leaving for a doctor's appointment.  I made an appointment to call patient on 03/31/16 at 8:30 AM to complete Extra Help application.     Lilla Shookachel Henderson, Pharm.D. Pharmacy Resident [facility] [phone]

## 2016-03-31 ENCOUNTER — Other Ambulatory Visit: Payer: Self-pay | Admitting: Pharmacist

## 2016-03-31 ENCOUNTER — Encounter: Payer: Self-pay | Admitting: Pharmacist

## 2016-03-31 ENCOUNTER — Other Ambulatory Visit: Payer: Self-pay | Admitting: *Deleted

## 2016-03-31 NOTE — Patient Outreach (Signed)
Second attempt made to contact pt (called her mobile number), f/u on referral for community nurse case management services.  HIPPA compliant voice message left with contact name and number.    Called pt on her home phone number, call successful.  Spoke with pt, HIPPA verified.  RN CM discussed with pt referral received  That she wants help with diabetic diet.  Pt reports she did the diabetic information in the mail, agreed to assistance to review.    As discussed, plan to do a home visit 5/9.   Shayne Alkenose M.   Pierzchala RN CCM [facility] Care Management  [phone]

## 2016-03-31 NOTE — Patient Outreach (Signed)
[facility](THN) Care Management  03/31/2016  Mack Cherrie DistanceR [place] 10/07/1961 [id number]   Kassia Amada Sheri Davis is a 55yo who was [facility] for medication assistance.  Per referral, patient reports some financial difficulty affording medications at times.    I made outreach call to patient today at scheduled time to complete application for Extra Help.  Phone call made to patient's home phone between the hours of 8:30 AM and 10:00 AM as requested by patient.  There was no answer, and no option to leave a voicemail on patient's home phone.    I will send patient an unsuccessful outreach letter per protocol as I have had greater than 3 unsuccessful attempts at reaching patient.     Lilla Shookachel Koren Plyler, Pharm.D. Pharmacy Resident [facility] [phone]

## 2016-04-04 ENCOUNTER — Ambulatory Visit: Payer: Self-pay | Admitting: *Deleted

## 2016-04-11 ENCOUNTER — Other Ambulatory Visit: Payer: Self-pay | Admitting: *Deleted

## 2016-04-11 ENCOUNTER — Encounter: Payer: Self-pay | Admitting: *Deleted

## 2016-04-11 NOTE — Patient Outreach (Addendum)
[facility]) Care Management   04/11/2016  Sheri Davis 1961/04/21 [id number]  Sheri Davis is an 55 y.o. female  Subjective:  Pt reports she has been using the diabetic information that was mailed to her from [facility], watching What she eats, lost 3 lbs from last MD visit.  Pt reports at last MD visit, sugar was 95 but A1C went from a 6.3 to 6.5   Pt  reports she is going to start back at the [facility]- swimming.  Pt reports she still needs to get batteries for her BP cuff, start checking her BP.  Pt reports she had country baked ham yesterday- reason BP elevated today with RN CM.   Objective:   Filed Vitals:   04/11/16 1100  BP: 140/100  Pulse: 63    ROS  Physical Exam  Constitutional: She is oriented to person, place, and time. She appears well-developed and well-nourished.  Cardiovascular: Regular rhythm and normal heart sounds.   Respiratory: Effort normal and breath sounds normal.  GI: Soft.  Musculoskeletal: Normal range of motion. She exhibits edema.  Trace edema noted top of both feet/ankles   Neurological: She is alert and oriented to person, place, and time.  Skin: Skin is warm and dry.  Psychiatric: She has a normal mood and affect. Her behavior is normal. Judgment and thought content normal.    Encounter Medications:  Reviewed with pt  Outpatient Encounter Prescriptions as of 04/11/2016  Medication Sig Note  . albuterol (VENTOLIN HFA) 108 (90 BASE) MCG/ACT inhaler INHALE ONE PUFF INTO LUNGS EVERY FOUR HOURS AS NEEDED FOR SHORTNESS OF BREATH   . atenolol (TENORMIN) 25 MG tablet TAKE 1 TABLET ('25MG'$ ) BY MOUTH DAILY. REFERENCE# :[id number]   . cromolyn (OPTICROM) 4 % ophthalmic solution PLACE 1 DROP INTO BOTH EYES 4 TIMES A DAY AS NEEDED.reference #[id number]   . efavirenz-emtricitabine-tenofovir (ATRIPLA) 600-200-300 MG tablet Take 1 tablet by mouth at bedtime.   . fluticasone (FLONASE) 50 MCG/ACT nasal spray USE ONE SPRAY IN EACH NOSTRIL ONCE A DAY    . fluticasone (FLOVENT HFA) 110 MCG/ACT inhaler Inhale 1 puff into the lungs daily as needed (for shortness of breath). (Patient taking differently: Inhale 1 puff into the lungs daily. Pt using as needed.)   . levocetirizine (XYZAL) 5 MG tablet Take 0.5 tablets (2.5 mg total) by mouth every evening.   . metFORMIN (GLUCOPHAGE) 500 MG tablet Take 1 tablet (500 mg total) by mouth 2 (two) times daily with a meal.   . triamcinolone cream (KENALOG) 0.1 % APPLY TO AFFECTED AREA TWICE A DAY AS NEEDED   . lansoprazole (PREVACID) 30 MG capsule TAKE ONE (1) CAPSULE EACH DAY AT NOON (Patient not taking: Reported on 04/11/2016)   . LORazepam (ATIVAN) 1 MG tablet ONE TABLET EVERY 8 HOURS AS NEEDED 04/11/2016: Pt takes 1/2 tablet as needed.    No facility-administered encounter medications on file as of 04/11/2016.    Functional Status:   In your present state of health, do you have any difficulty performing the following activities: 04/11/2016 03/17/2016  Hearing? N N  Vision? Y N  Difficulty concentrating or making decisions? N N  Walking or climbing stairs? N N  Dressing or bathing? N N  Doing errands, shopping? N N  Preparing Food and eating ? N N  Using the Toilet? N N  In the past six months, have you accidently leaked urine? N N  Do you have problems with loss of bowel control? N N  Managing your Medications? N N  Managing your Finances? N N  Housekeeping or managing your Housekeeping? N N    Fall/Depression Screening:    PHQ 2/9 Scores 04/11/2016 03/17/2016 03/01/2016 09/09/2015 08/18/2015 04/29/2015 02/26/2015  PHQ - 2 Score '2 1 1 '$ 0 0 0 0  PHQ- 9 Score 5 - - - - - -    Assessment:  Pleasant 55 year old woman, resides in a duplex.                           DM-  Per pt, last blood sugar was 95  (MD  office visit), does not have a glucometer.                                   Pt received diabetic information from [facility] - RN CM reviewed with pt.  Pt reports lost                                  3 lbs in  a month since complying with diabetic diet.                          HTN- BP today 140/90- taken by RN CM.   Per pt takes BP med at night, had country ham                                    Yesterday.  Batteries replaced in pt's BP machine today.  Trace edema noted- top                                    Of both feet.   Plan:  As discussed, pt to call MD office, request a rx for a glucometer/supplies be sent her pharmacy,               start checking sugars/record.             Pt to start back at the [facility].              Pt to start checking BP daily/record, better compliance with a Low Na+ diet.            Plan to continue to provide pt with community nurse case management services, f/u again 6/9.             Plan to send Cammie Sickle NP today's home visit encounter- by in basket.   THN CM Care Plan Problem One        Most Recent Value   Care Plan Problem One  Pre diabetes A1C went from 6.3 to 6.5   Role Documenting the Problem One  Care Management [place] for Problem One  Active   THN Long Term Goal (31-90 days)  Pt A1C will drop one point in next 60 days    THN Long Term Goal Start Date  04/11/16   Interventions for Problem One Long Term Goal  RN CM reviewed with pt diabetic information mailed-  plate planner, carb counting, good carbs to choose from, portion sizes.    THN CM Short Term Goal #  1 (0-30 days)  Pt would get a rx for glucometer from MD, start checking sugars in the next 30 days    THN CM Short Term Goal #1 Start Date  04/11/16   Interventions for Short Term Goal #1  Discussed with pt importance of monitoring sugars, recording, calling MD if too low.     [facility] CM Care Plan Problem Two        Most Recent Value   Care Plan Problem Two  Knowledge deficit related to hypertension   Role Documenting the Problem Two  Care Management Coordinator   Care Plan for Problem Two  Active   THN CM Short Term Goal #1 (0-30 days)  pt will obtain batteries for her blood pressure  cuff and check blood pressure 3 times per week and log within 30 days.   THN CM Short Term Goal #1 Start Date  03/17/16   [facility] CM Short Term Goal #1 Met Date   04/11/16 [RN CM provided batteries for pt. ]   Interventions for Short Term Goal #2   RN CM provided pt with batteries for BP machine, checked pt's BP for accuracy of machine, also discussed Low Na+ diet.    THN CM Short Term Goal #2 (0-30 days)  Pt will start checking BP daily/record for the next 30 days    THN CM Short Term Goal #2 Start Date  04/11/16   Interventions for Short Term Goal #2  Discussed with pt importance of checking BP daily, record, compliance with a low na+ diet      Rose M.   [place] Care Management  [phone]

## 2016-04-17 ENCOUNTER — Other Ambulatory Visit: Payer: Medicare PPO

## 2016-04-17 ENCOUNTER — Other Ambulatory Visit: Payer: Self-pay | Admitting: Pharmacist

## 2016-04-17 NOTE — Patient Outreach (Signed)
[facility]) Care Management  [facility]   04/17/2016  Romona Cherrie DistanceR Neiswender 10/18/1961 [id number]  Subjective: Sheri Davis is a 55 yo who [facility].  Per referral, patient reports some financial difficulty affording medications at times.  Patient has requested that all phone calls be made to her home phone between the hours of 8:30 AM and 10 AM.    I have made several phone calls to attempt to reach patient to complete Extra Help application, but have not been successful.  I have also mailed patient an unsuccessful outreach letter on 03/31/16.  [facility] CMRN Jodi MourningRose Pierzchala had home visit with patient on 04/11/16.  Rose informed patient that I had been trying to reach her.  Patient said to call her cell phone if calling home phone is unsuccessful.    Phone call to patient today.  Verified patient's name and date of birth.  Patient confirms she is still interested in completing Extra Help application.  Began Extra Help application with patient.  Patient then stated she needed to get off the phone and will have to call me back to finish the application.  Patient requested my contact information to call me back.  I provided patient with my contact information.  Patient reports she will call me back to complete application.    Assessment/Plan: Patient was unable to complete Extra Help application on the phone today.  Extra Help application re-entry number is [id number].  Will await return phone call from patient in order to complete Extra Help application.    Lilla Shookachel Henderson, Pharm.D. Pharmacy Resident [facility] [phone]

## 2016-04-21 ENCOUNTER — Other Ambulatory Visit: Payer: Medicare PPO

## 2016-04-21 DIAGNOSIS — B20 Human immunodeficiency virus [HIV] disease: Secondary | ICD-10-CM

## 2016-04-21 LAB — T-HELPER CELL (CD4) - (RCID CLINIC ONLY)
CD4 T CELL HELPER: 39 % (ref 33–55)
CD4 T Cell Abs: 1240 /uL (ref 400–2700)

## 2016-04-25 LAB — HIV-1 RNA QUANT-NO REFLEX-BLD
HIV 1 RNA Quant: <20 copies/mL (ref <20)
HIV-1 RNA Quant, Log: <1.3 Log copies/mL (ref <1.30)

## 2016-04-26 ENCOUNTER — Ambulatory Visit: Payer: Self-pay | Admitting: *Deleted

## 2016-04-27 ENCOUNTER — Other Ambulatory Visit: Payer: Self-pay | Admitting: Infectious Diseases

## 2016-04-28 ENCOUNTER — Other Ambulatory Visit: Payer: Self-pay | Admitting: Family Medicine

## 2016-05-01 ENCOUNTER — Encounter: Payer: Self-pay | Admitting: Infectious Diseases

## 2016-05-01 ENCOUNTER — Ambulatory Visit: Payer: Medicare PPO | Admitting: *Deleted

## 2016-05-01 ENCOUNTER — Ambulatory Visit (INDEPENDENT_AMBULATORY_CARE_PROVIDER_SITE_OTHER): Payer: Medicare PPO | Admitting: Infectious Diseases

## 2016-05-01 ENCOUNTER — Other Ambulatory Visit: Payer: Self-pay | Admitting: Pharmacist

## 2016-05-01 VITALS — Temp 98.2°F | Wt 230.0 lb

## 2016-05-01 DIAGNOSIS — E785 Hyperlipidemia, unspecified: Secondary | ICD-10-CM | POA: Diagnosis not present

## 2016-05-01 DIAGNOSIS — I10 Essential (primary) hypertension: Secondary | ICD-10-CM

## 2016-05-01 DIAGNOSIS — F4310 Post-traumatic stress disorder, unspecified: Secondary | ICD-10-CM

## 2016-05-01 DIAGNOSIS — B20 Human immunodeficiency virus [HIV] disease: Secondary | ICD-10-CM

## 2016-05-01 NOTE — Patient Outreach (Signed)
[facility](THN) Care Management  05/01/2016  Sheri Davis 10/19/1961 [id number]   Sheri Davis is a 55 yo who [facility]. Per referral, patient reports some financial difficulty affording medications at times.    I have made multiple phone calls to attempt to reach patient to complete Extra Help application, but have not been successful.  I have also mailed patient an unsuccessful outreach letter on 03/31/16.  I am closing pharmacy program at this time as patient has withdrawn from the program.  I will update [facility] CMRN Rose Pierzchala.     Lilla Shookachel Caeley Dohrmann, Pharm.D. Pharmacy Resident [facility] [phone]

## 2016-05-01 NOTE — BH Specialist Note (Signed)
Counselor met with Sheri Davis in the exam room today for a warm hand off.  Patient was oriented times four with sad affect.  Patient communicated that she had some things going on in her life that she was not handling well and that it had made her very upset.  Patient stated that she cries all the time.  Patient also shared that her part time job is in jeopardy and is worried that if she looses it she will lose the place where she rents. Counselor provided support and encouragement for patient as she described tearfully what is going on in her life.  Counselor encouraged patient to make an appointment to meet with counselor to further process the difficult areas in her life presently.  Patient said she would when she checked out today.   Rolena Infante, MA, LPC [facility]

## 2016-05-01 NOTE — Assessment & Plan Note (Addendum)
She is doing very well.  Will continue her current meds.  Offered/refused condoms.  Needs PCV 23 in 2018 Will see her back in 6 months Sheri BlonderDenise will get pap in June

## 2016-05-01 NOTE — Assessment & Plan Note (Signed)
HIV, DM, beta- blocker.  Consider starting lipitor 10mg .

## 2016-05-01 NOTE — Progress Notes (Signed)
   Subjective:    Patient ID: Sheri Davis, female    DOB: 01/23/1961, 55 y.o.   MRN: [id number]  HPI 55 yo F with HIV+ 2007, HTN (on atenolol), hyperlipidemia (off pravachol), depression.  Has been on atripla since dx. Tried to change her to odefsy but her insurance would not cover.  Still depressed today- worried she will lose her benefits as she has gotten part time work, after being encouraged by her counselor for her depression.  She becomes tearful in clinic.  Today complains of allergies- has been taking OTC (zyxal), also noted swollen R cevical LN.   HIV 1 RNA QUANT (copies/mL)  Date Value  04/21/2016 <20  02/04/2016 <20  04/29/2015 <20   CD4 T CELL ABS (/uL)  Date Value  04/21/2016 1240  02/04/2016 1280  04/29/2015 1170   Last pap was March 2017. Nl Mammo nl 04-2015.   Review of Systems  Constitutional: Negative for appetite change and unexpected weight change.  Gastrointestinal: Negative for diarrhea and constipation.  Genitourinary: Negative for difficulty urinating and menstrual problem.  Hematological: Positive for adenopathy.  Psychiatric/Behavioral: Positive for dysphoric mood.       Objective:   Physical Exam  Constitutional: She appears well-developed and well-nourished.  HENT:  Mouth/Throat: No oropharyngeal exudate.  Eyes: EOM are normal. Pupils are equal, round, and reactive to light.  Neck: Neck supple.  Cardiovascular: Normal rate and regular rhythm.   Pulmonary/Chest: Effort normal and breath sounds normal.  Abdominal: Soft. Bowel sounds are normal. There is no tenderness.  Lymphadenopathy:    She has no cervical adenopathy.  Psychiatric:  Becomes tearful. Then returns to her baseline.       Assessment & Plan:

## 2016-05-01 NOTE — Assessment & Plan Note (Signed)
Her SBP is 135 today.  Appreciate [place]'s help.

## 2016-05-01 NOTE — Assessment & Plan Note (Signed)
Appreciate jodi seeing her this AM.

## 2016-05-02 ENCOUNTER — Other Ambulatory Visit: Payer: Self-pay | Admitting: Family Medicine

## 2016-05-02 DIAGNOSIS — H1010 Acute atopic conjunctivitis, unspecified eye: Secondary | ICD-10-CM

## 2016-05-02 DIAGNOSIS — F411 Generalized anxiety disorder: Secondary | ICD-10-CM

## 2016-05-02 MED ORDER — CROMOLYN SODIUM 4 % OP SOLN: OPHTHALMIC | Status: DC

## 2016-05-02 MED ORDER — LORAZEPAM 1 MG PO TABS: ORAL_TABLET | ORAL | Status: DC

## 2016-05-03 ENCOUNTER — Other Ambulatory Visit: Payer: Self-pay | Admitting: Family Medicine

## 2016-05-04 ENCOUNTER — Telehealth: Payer: Self-pay

## 2016-05-04 ENCOUNTER — Other Ambulatory Visit: Payer: Self-pay

## 2016-05-04 ENCOUNTER — Other Ambulatory Visit: Payer: Self-pay | Admitting: *Deleted

## 2016-05-04 DIAGNOSIS — H1010 Acute atopic conjunctivitis, unspecified eye: Secondary | ICD-10-CM

## 2016-05-04 DIAGNOSIS — J302 Other seasonal allergic rhinitis: Secondary | ICD-10-CM

## 2016-05-04 MED ORDER — CROMOLYN SODIUM 4 % OP SOLN: OPHTHALMIC | Status: DC

## 2016-05-04 MED ORDER — LEVOCETIRIZINE DIHYDROCHLORIDE 5 MG PO TABS: 2.5000 mg | ORAL_TABLET | Freq: Every evening | ORAL | Status: DC

## 2016-05-04 MED ORDER — TRIAMCINOLONE ACETONIDE 0.1 % EX CREA: TOPICAL_CREAM | CUTANEOUS | Status: DC

## 2016-05-04 MED ORDER — FLUTICASONE PROPIONATE HFA 110 MCG/ACT IN AERO: INHALATION_SPRAY | RESPIRATORY_TRACT | Status: DC

## 2016-05-04 NOTE — Telephone Encounter (Signed)
Pt is requesting medication refills on the following medications: Chromolyn, Triamcinolone, Flovent, Albuterol, Xyzal. Thanks!

## 2016-05-04 NOTE — Patient Outreach (Addendum)
[facility]) Care Management  05/04/2016  Sheri Davis 06/07/1961 [id number]   Follow up phone call to patient to assess for social work needs.  Patient is requesting assistance with getting her Advanced Directive and Living Will done.  Hospice and Palliative care discussed as an option for assistance with this.  They will assist with completing the document as well as getting it notarized.  Patient in agreement to have this set up and has access to transportation to get there. Patient also requesting community assistance with getting new glasses.  This Child psychotherapistsocial worker contacted [name], LCSW at [facility] [place]. Appointment scheduled for 05/12/16 at 9am to have Advanced Directive and Living Will completed.  Plan:  LCSW to [facility] [place] on 05/12/16 at [facility]9am.   [name], LCSW [facility] Care Management [phone]

## 2016-05-04 NOTE — Telephone Encounter (Signed)
Medications requested sent into pharmacy. Thanks!~

## 2016-05-04 NOTE — Telephone Encounter (Signed)
Eye drop sent into correct pharmacy. Thanks!

## 2016-05-05 ENCOUNTER — Ambulatory Visit: Payer: Self-pay | Admitting: *Deleted

## 2016-05-08 ENCOUNTER — Ambulatory Visit: Payer: Medicare PPO | Admitting: *Deleted

## 2016-05-10 ENCOUNTER — Ambulatory Visit: Payer: Self-pay | Admitting: *Deleted

## 2016-05-12 ENCOUNTER — Encounter: Payer: Self-pay | Admitting: *Deleted

## 2016-05-12 ENCOUNTER — Other Ambulatory Visit: Payer: Self-pay | Admitting: *Deleted

## 2016-05-12 NOTE — Patient Outreach (Signed)
[facility]) Care Management  05/12/2016  Sheri Davis December 14, 1960 [id number]   This social worker met patient at [place].  Patient's Advanced Directive completed and notarized.  Patient verbalized having no other social work needs at this time. Patient did request resources to get bi-focal's for her glasses.  This social worker contacted [employer] as well as checked the [facility] area resource guide, however no information obtained related to this.  Per patient, she does have a pair of glasses paid for with her insurance benefit and she  has purchased a pair of readers that she uses.  Patient verbalized no further social work needs.  Copy of patient's Advanced Directive obtained and will be scanned to her chart.  Patient will provide a copy to her doctor and chosen [place].   Case to be closed to social work at this time.   [name] [facility] Care Management [phone]

## 2016-05-18 ENCOUNTER — Ambulatory Visit: Payer: Medicare PPO | Admitting: *Deleted

## 2016-05-22 ENCOUNTER — Other Ambulatory Visit: Payer: Self-pay | Admitting: Family Medicine

## 2016-05-22 ENCOUNTER — Other Ambulatory Visit: Payer: Self-pay | Admitting: Infectious Diseases

## 2016-05-22 DIAGNOSIS — Z1231 Encounter for screening mammogram for malignant neoplasm of breast: Secondary | ICD-10-CM

## 2016-05-24 ENCOUNTER — Other Ambulatory Visit: Payer: Self-pay | Admitting: *Deleted

## 2016-05-24 NOTE — Patient Outreach (Signed)
Attempt made to contact pt to reschedule 6/14 home visit, cancelled by pt via text 6/13.  Returned text to pt  not able to reschedule for home visit for week of 6/19, attempt made for 6/28- no response.    Late entry- attempt made to contact pt also on  6/15, HIPPA compliant voice message left, no response.   HIPPA compliant voice message left today, if no response, will do one more attempt.     Shayne Alkenose M.   Pierzchala RN CCM [facility] Care [facility]  [phone]

## 2016-05-25 ENCOUNTER — Other Ambulatory Visit: Payer: Self-pay | Admitting: *Deleted

## 2016-05-25 NOTE — Patient Outreach (Signed)
Attempt made to contact pt on home phone- phone kept ringing, unable to leave a voice message.   Attempt made to contact pt on mobile phone, received voice message.  HIPAA compliant voice message left.  With this being third attempt, if no response, unable to contact letter will be sent and if no response to letter in 10 business days- plan to notify MD, close case.     Shayne Alkenose M.   Pierzchala RN CCM [facility]  [phone]

## 2016-05-28 ENCOUNTER — Encounter: Payer: Self-pay | Admitting: *Deleted

## 2016-06-01 ENCOUNTER — Other Ambulatory Visit: Payer: Self-pay | Admitting: *Deleted

## 2016-06-01 ENCOUNTER — Ambulatory Visit: Payer: Medicare PPO | Admitting: Family Medicine

## 2016-06-01 NOTE — Patient Outreach (Signed)
Received  A call from pt, requesting to continue with community nurse case management services.   Note- unable to contact letter sent 7/3.      As discussed, plan to f/u with pt 7/12- home visit.     Shayne Alkenose M.   Pierzchala RN CCM [facility] Care Management  [phone]

## 2016-06-02 ENCOUNTER — Other Ambulatory Visit: Payer: Self-pay | Admitting: *Deleted

## 2016-06-02 NOTE — Patient Outreach (Signed)
[facility]) Care Management  06/02/2016  Sheri Davis 01/13/1961 [id number]   Phone call from patient stating that she has lost her disability benefits and will have to file an appeal.  Patient concerned that she may not be able to afford her rent.    Emergency/Crisis Service resources provided to patient including [employer] and [place] [employer], [employer].  Return phone call made to give patient the contact number for legal aid to assist her with the appeal, however patient did not answer the phone and her voicemail was full. Contact information for Legal Aid to be provided to patient by mail. Emergency/Crisis Service information previously provided will also be mailed to patient.   Adriana ReamsChrystal Land, LCSW [facility] Care Management [phone]

## 2016-06-07 ENCOUNTER — Ambulatory Visit: Payer: Self-pay | Admitting: *Deleted

## 2016-06-07 ENCOUNTER — Other Ambulatory Visit: Payer: Self-pay | Admitting: *Deleted

## 2016-06-07 NOTE — Patient Outreach (Addendum)
This RN CM noted in route to pt's home today, text message received from pt late last night to cancel today's home visit- sick.   Returned text message (HIPAA compliant, no pt identifiers) to see if 7/19 a good day to reschedule.   If I do not hear back from pt, plan to f/u telephonically.      Shayne Alkenose M.   Greogry Goodwyn RN CCM [facility] Care Management  [phone]

## 2016-06-09 ENCOUNTER — Ambulatory Visit: Payer: Medicare PPO

## 2016-06-12 ENCOUNTER — Ambulatory Visit: Payer: Self-pay | Admitting: *Deleted

## 2016-06-12 ENCOUNTER — Other Ambulatory Visit: Payer: Self-pay | Admitting: *Deleted

## 2016-06-12 NOTE — Patient Outreach (Signed)
1:22 pm- Attempt made to contact pt on her home phone, reschedule home visit (7/12 cancelled by pt- sick). Unable to leave a voice message as phone kept ringing.  1:25 pm- Attempt made to contact pt on her mobile phone, reschedule home visit.  HIPAA compliant voice message left with contact number.  If no response, will try again.       Shayne Alkenose M.   Pierzchala RN CCM [facility] Care Management  [phone]

## 2016-06-13 ENCOUNTER — Other Ambulatory Visit: Payer: Self-pay | Admitting: *Deleted

## 2016-06-13 NOTE — Patient Outreach (Signed)
Third attempt made to contact pt, reschedule cancelled 7/12 home visit.  HIPAA  Compliant voice message left with contact name and number.   If no response, plan to send unable to contact letter, if no response to letter in 10 business days, to close case.     Shayne Alkenose M.   Pierzchala RN CCM [facility] Care [facility]  [phone]

## 2016-06-15 ENCOUNTER — Encounter: Payer: Self-pay | Admitting: *Deleted

## 2016-06-15 NOTE — Patient Outreach (Signed)
[facility]) Care Management  06/15/2016  Sheri Davis 12/20/1960 [id number]   Unable to contact letter to be sent to pt as three unsuccessful telephone attempts made.   Per [facility] policy, if do not hear Back from pt in 10 business days, to close case, notify MD.     Shayne Alkenose M.   Pierzchala RN CCM [facility] Care Management  [phone]

## 2016-06-19 ENCOUNTER — Other Ambulatory Visit: Payer: Self-pay | Admitting: Family Medicine

## 2016-06-19 DIAGNOSIS — H1010 Acute atopic conjunctivitis, unspecified eye: Secondary | ICD-10-CM

## 2016-06-20 MED ORDER — CROMOLYN SODIUM 4 % OP SOLN: OPHTHALMIC | 0 refills | Status: DC

## 2016-06-22 ENCOUNTER — Other Ambulatory Visit: Payer: Self-pay | Admitting: Family Medicine

## 2016-06-22 DIAGNOSIS — F411 Generalized anxiety disorder: Secondary | ICD-10-CM

## 2016-06-23 ENCOUNTER — Other Ambulatory Visit: Payer: Self-pay | Admitting: Family Medicine

## 2016-06-23 DIAGNOSIS — E119 Type 2 diabetes mellitus without complications: Secondary | ICD-10-CM

## 2016-06-23 DIAGNOSIS — F411 Generalized anxiety disorder: Secondary | ICD-10-CM

## 2016-06-23 MED ORDER — LORAZEPAM 1 MG PO TABS: ORAL_TABLET | ORAL | 0 refills | Status: DC

## 2016-06-23 NOTE — Telephone Encounter (Signed)
Medical Assistant left message on patient's home and cell voicemail. Voicemail states to give a call back to [place] with [facility] at [phone].  !!!Please ask the patient which pharmacy would she like her med faxed to!!!

## 2016-06-26 ENCOUNTER — Ambulatory Visit
Admission: RE | Admit: 2016-06-26 | Discharge: 2016-06-26 | Disposition: A | Discharge status: Home / Self Care | Payer: Medicare PPO | Source: Ambulatory Visit | Attending: Family Medicine | Admitting: Family Medicine

## 2016-06-26 ENCOUNTER — Other Ambulatory Visit: Payer: Self-pay | Admitting: Family Medicine

## 2016-06-26 DIAGNOSIS — Z1231 Encounter for screening mammogram for malignant neoplasm of breast: Secondary | ICD-10-CM | POA: Insufficient documentation

## 2016-06-29 ENCOUNTER — Encounter: Payer: Self-pay | Admitting: *Deleted

## 2016-06-29 ENCOUNTER — Other Ambulatory Visit: Payer: Self-pay | Admitting: *Deleted

## 2016-06-29 NOTE — Patient Outreach (Signed)
This RN CM had no  response to 3 telephone attempts made so  unable to contact letter sent and no response.  Therefore, plan to close pt's case.     Plan  to  notify Julianne Handler NP of case closure - send in basket.    Plan to inform [facility] care manager to close case.   Shayne Alken.   Pierzchala RN CCM [facility] Care Management  [phone]

## 2016-07-03 ENCOUNTER — Ambulatory Visit: Payer: Medicare PPO | Admitting: Family Medicine

## 2016-07-12 ENCOUNTER — Other Ambulatory Visit: Payer: Self-pay | Admitting: Family Medicine

## 2016-07-12 DIAGNOSIS — H1010 Acute atopic conjunctivitis, unspecified eye: Secondary | ICD-10-CM

## 2016-08-14 ENCOUNTER — Other Ambulatory Visit: Payer: Self-pay | Admitting: Infectious Diseases

## 2016-08-14 ENCOUNTER — Other Ambulatory Visit: Payer: Self-pay | Admitting: Family Medicine

## 2016-08-14 DIAGNOSIS — E119 Type 2 diabetes mellitus without complications: Secondary | ICD-10-CM

## 2016-08-21 ENCOUNTER — Ambulatory Visit: Payer: Medicare PPO | Admitting: Family Medicine

## 2016-09-07 ENCOUNTER — Other Ambulatory Visit: Payer: Self-pay | Admitting: Family Medicine

## 2016-09-07 DIAGNOSIS — I10 Essential (primary) hypertension: Secondary | ICD-10-CM

## 2016-09-13 ENCOUNTER — Ambulatory Visit (INDEPENDENT_AMBULATORY_CARE_PROVIDER_SITE_OTHER): Payer: Medicare PPO | Admitting: Family Medicine

## 2016-09-13 ENCOUNTER — Encounter: Payer: Self-pay | Admitting: Family Medicine

## 2016-09-13 VITALS — BP 137/91 | Temp 98.2°F | Resp 100 | Ht 64.5 in | Wt 224.0 lb

## 2016-09-13 DIAGNOSIS — R7303 Prediabetes: Secondary | ICD-10-CM | POA: Diagnosis not present

## 2016-09-13 DIAGNOSIS — J301 Allergic rhinitis due to pollen: Secondary | ICD-10-CM

## 2016-09-13 DIAGNOSIS — I10 Essential (primary) hypertension: Secondary | ICD-10-CM

## 2016-09-13 DIAGNOSIS — H1010 Acute atopic conjunctivitis, unspecified eye: Secondary | ICD-10-CM

## 2016-09-13 DIAGNOSIS — F411 Generalized anxiety disorder: Secondary | ICD-10-CM

## 2016-09-13 DIAGNOSIS — K219 Gastro-esophageal reflux disease without esophagitis: Secondary | ICD-10-CM

## 2016-09-13 DIAGNOSIS — Z23 Encounter for immunization: Secondary | ICD-10-CM

## 2016-09-13 DIAGNOSIS — H1045 Other chronic allergic conjunctivitis: Secondary | ICD-10-CM

## 2016-09-13 LAB — CBC WITH DIFFERENTIAL/PLATELET
BASOS PCT: 0 %
Basophils Absolute: 0 cells/uL (ref 0–200)
EOS PCT: 2 %
Eosinophils Absolute: 132 cells/uL (ref 15–500)
HCT: 41 % (ref 35.0–45.0)
HEMOGLOBIN: 13.6 g/dL (ref 11.7–15.5)
LYMPHS ABS: 3168 {cells}/uL (ref 850–3900)
Lymphocytes Relative: 48 %
MCH: 31.3 pg (ref 27.0–33.0)
MCHC: 33.2 g/dL (ref 32.0–36.0)
MCV: 94.3 fL (ref 80.0–100.0)
MPV: 10.2 fL (ref 7.5–12.5)
Monocytes Absolute: 396 cells/uL (ref 200–950)
Monocytes Relative: 6 %
NEUTROS ABS: 2904 {cells}/uL (ref 1500–7800)
Neutrophils Relative %: 44 %
Platelets: 340 10*3/uL (ref 140–400)
RBC: 4.35 MIL/uL (ref 3.80–5.10)
RDW: 13.1 % (ref 11.0–15.0)
WBC: 6.6 10*3/uL (ref 3.8–10.8)

## 2016-09-13 LAB — COMPLETE METABOLIC PANEL WITH GFR
ALBUMIN: 4.1 g/dL (ref 3.6–5.1)
ALK PHOS: 74 U/L (ref 33–130)
ALT: 22 U/L (ref 6–29)
AST: 22 U/L (ref 10–35)
BILIRUBIN TOTAL: 0.2 mg/dL (ref 0.2–1.2)
BUN: 12 mg/dL (ref 7–25)
CO2: 24 mmol/L (ref 20–31)
Calcium: 9.4 mg/dL (ref 8.6–10.4)
Chloride: 108 mmol/L (ref 98–110)
Creat: 0.68 mg/dL (ref 0.50–1.05)
GFR, Est African American: >89 mL/min (ref >=60)
GFR, Est Non African American: >89 mL/min (ref >=60)
GLUCOSE: 102 mg/dL — AB (ref 65–99)
POTASSIUM: 4.7 mmol/L (ref 3.5–5.3)
SODIUM: 140 mmol/L (ref 135–146)
TOTAL PROTEIN: 6.7 g/dL (ref 6.1–8.1)

## 2016-09-13 LAB — LIPID PANEL
CHOL/HDL RATIO: 3.6 ratio (ref <=5.0)
Cholesterol: 283 mg/dL — ABNORMAL HIGH (ref 125–200)
HDL: 79 mg/dL (ref >=46)
LDL Cholesterol: 185 mg/dL — ABNORMAL HIGH (ref <130)
Triglycerides: 95 mg/dL (ref <150)
VLDL: 19 mg/dL (ref <30)

## 2016-09-13 MED ORDER — METFORMIN HCL 500 MG PO TABS: 500.0000 mg | ORAL_TABLET | Freq: Two times a day (BID) | ORAL | 1 refills | Status: DC

## 2016-09-13 MED ORDER — FLUTICASONE PROPIONATE HFA 110 MCG/ACT IN AERO: INHALATION_SPRAY | RESPIRATORY_TRACT | 3 refills | Status: DC

## 2016-09-13 MED ORDER — ALBUTEROL SULFATE HFA 108 (90 BASE) MCG/ACT IN AERS
INHALATION_SPRAY | RESPIRATORY_TRACT | 3 refills | Status: DC
Start: 2016-09-13 — End: 2017-05-21

## 2016-09-13 MED ORDER — CROMOLYN SODIUM 4 % OP SOLN: OPHTHALMIC | 3 refills | Status: DC

## 2016-09-13 MED ORDER — TRIAMCINOLONE ACETONIDE 0.1 % EX CREA: TOPICAL_CREAM | CUTANEOUS | 3 refills | Status: DC

## 2016-09-13 MED ORDER — LEVOCETIRIZINE DIHYDROCHLORIDE 5 MG PO TABS: 2.5000 mg | ORAL_TABLET | Freq: Every evening | ORAL | 5 refills | Status: DC

## 2016-09-13 MED ORDER — LORAZEPAM 1 MG PO TABS: ORAL_TABLET | ORAL | 0 refills | Status: DC

## 2016-09-13 MED ORDER — FLUTICASONE PROPIONATE 50 MCG/ACT NA SUSP: NASAL | 1 refills | Status: DC

## 2016-09-13 MED ORDER — LANSOPRAZOLE 30 MG PO CPDR: DELAYED_RELEASE_CAPSULE | ORAL | 1 refills | Status: DC

## 2016-09-13 MED ORDER — ATENOLOL 25 MG PO TABS: ORAL_TABLET | ORAL | 1 refills | Status: DC

## 2016-09-13 NOTE — Progress Notes (Signed)
Sheri Davis, is a 55 y.o. female  ZOX:[id number]:[id number]  WJX:[id number]  DOB - 11/30/1960  CC:  Chief Complaint  Patient presents with  . Hypertension  . Anxiety    asking for a refill on ativan        HPI: Sheri Davis is a 55 y.o. female here for follow-up chronic conditions. She has a history of allergies, asthma, GERD, hyperlipidemia, anxiety. She has HIV and is followed by [facility]. She is needing refills of atenolol, flonase, flovent, prevacid, xyzal, ativan, ventolin and metformin.   Health Maintenance: Will receive influenza vaccine today. She is up to date on PAP, mammogram and colon cancer screening.   No Known Allergies Past Medical History:  Diagnosis Date  . Allergy   . Asthma   . GERD (gastroesophageal reflux disease)   . HIV infection (HCC)   . Hyperlipidemia   . Tachycardia y-12   Pt was under stress at the time   Current Outpatient Prescriptions on File Prior to Visit  Medication Sig Dispense Refill  . ATRIPLA 600-200-300 MG tablet TAKE 1 TABLET BY MOUTH AT BEDTIME. 30 tablet 10   No current facility-administered medications on file prior to visit.    Family History  Problem Relation Age of Onset  . Osteoarthritis Father   . Diabetes Father   . Hypertension Father   . Heart disease Mother   . Depression Mother   . Mental illness Mother   . Anemia Mother   . Hypokalemia Mother   . Diabetes Brother    Social History   Social History  . Marital status: Single    Spouse name: N/A  . Number of children: N/A  . Years of education: N/A   Occupational History  . Not on file.   Social History Main Topics  . Smoking status: Former Games developermoker  . Smokeless tobacco: Never Used  . Alcohol use Yes     Comment: occasional  . Drug use: No  . Sexual activity: No     Comment: pt. declined condoms   Other Topics Concern  . Not on file   Social History Narrative  . No narrative on file    Review of Systems: Constitutional: Negative Skin: +  for generalized dry skin and itching.  HENT: + for intermittent allergy symptoms  Eyes: Negative . Wears glasses Neck: Negative Respiratory: + for albuterol use 1-2 times a month.  Cardiovascular: Negative Gastrointestinal: Negative Genitourinary: Negative  Musculoskeletal: + for back, hip and knee pain of chronic nature Neurological: Negative for Hematological: + for HIV Psychiatric/Behavioral: + for anxiety   Objective:   Vitals:   09/13/16 0824  BP: (!) 137/91  Resp: (!) 100  Temp: 98.2 F (36.8 C)    Physical Exam: Constitutional: Patient appears well-developed and well-nourished. No distress. HENT: Normocephalic, atraumatic, External right and left ear normal. Oropharynx is clear and moist.  Eyes: Conjunctivae and EOM are normal. PERRLA, no scleral icterus. Neck: Normal ROM. Neck supple. No lymphadenopathy, No thyromegaly. CVS: RRR, S1/S2 +, no murmurs, no gallops, no rubs Pulmonary: Effort and breath sounds normal, no stridor, rhonchi, wheezes, rales.  Abdominal: Soft. Normoactive BS,, no distension, tenderness, rebound or guarding.  Musculoskeletal: Normal range of motion. No edema and no tenderness.  Neuro: Alert.Normal muscle tone coordination. Non-focal Skin: Skin is warm and dry. No rash noted. Not diaphoretic. No erythema. No pallor. Psychiatric: Normal mood and affect. Behavior, judgment, thought content normal.  Lab Results  Component Value Date   WBC 6.6 02/04/2016  HGB 13.7 02/04/2016   HCT 40.6 02/04/2016   MCV 94.0 02/04/2016   PLT 320 02/04/2016   Lab Results  Component Value Date   CREATININE 0.62 02/04/2016   BUN 12 02/04/2016   NA 141 02/04/2016   K 4.6 02/04/2016   CL 106 02/04/2016   CO2 24 02/04/2016    Lab Results  Component Value Date   HGBA1C 6.5 (H) 02/18/2016   Lipid Panel     Component Value Date/Time   CHOL 295 (H) 02/04/2016 0943   TRIG 228 (H) 02/04/2016 0943   HDL 71 02/04/2016 0943   CHOLHDL 4.2 02/04/2016 0943    VLDL 46 (H) 02/04/2016 0943   LDLCALC 178 (H) 02/04/2016 0943       Assessment and plan:   1. Essential hypertension  - CBC with Differential - COMPLETE METABOLIC PANEL WITH GFR - Lipid panel - atenolol (TENORMIN) 25 MG tablet; TAKE 1 TABLET (25MG ) BY MOUTH DAILY  Dispense: 90 tablet; Refill: 1  2. Prediabetes  - Hemoglobin A1c  3. Need for prophylactic vaccination and inoculation against influenza  - Flu Vaccine QUAD 36+ mos PF IM (Fluarix & Fluzone Quad PF)   4. Prediabetes - metFORMIN (GLUCOPHAGE) 500 MG tablet; Take 1 tablet (500 mg total) by mouth 2 (two) times daily with a meal.  Dispense: 189 tablet; Refill: 1  5. Seasonal allergic rhinitis due to pollen, unspecified chronicity  - levocetirizine (XYZAL) 5 MG tablet; Take 0.5 tablets (2.5 mg total) by mouth every evening.  Dispense: 30 tablet; Refill: 5  6. Seasonal allergic conjunctivitis  - cromolyn (OPTICROM) 4 % ophthalmic solution; PLACE 1 DROP INTO BOTH EYES 4 TIMES A DAY AS NEEDED  Dispense: 10 mL; Refill: 3  7. Gastroesophageal reflux disease without esophagitis  - lansoprazole (PREVACID) 30 MG capsule; TAKE ONE (1) CAPSULE EACH DAY AT NOON  Dispense: 90 capsule; Refill: 1  8. Anxiety state  - LORazepam (ATIVAN) 1 MG tablet; ONE TABLET EVERY 8 HOURS AS NEEDED  Dispense: 60 tablet; Refill: 0   Return in about 3 months (around 12/14/2016).  The patient was given clear instructions to go to ER or return to medical center if symptoms don't improve, worsen or new problems develop. The patient verbalized understanding.    Henrietta Hoover FNP  09/13/2016, 10:07 AM

## 2016-09-14 LAB — HEMOGLOBIN A1C
HEMOGLOBIN A1C: 6 % — AB (ref <5.7)
MEAN PLASMA GLUCOSE: 126 mg/dL

## 2016-10-16 ENCOUNTER — Other Ambulatory Visit: Payer: Medicare PPO

## 2016-10-16 DIAGNOSIS — B20 Human immunodeficiency virus [HIV] disease: Secondary | ICD-10-CM

## 2016-10-16 LAB — COMPREHENSIVE METABOLIC PANEL
ALBUMIN: 4.2 g/dL (ref 3.6–5.1)
ALT: 23 U/L (ref 6–29)
AST: 24 U/L (ref 10–35)
Alkaline Phosphatase: 72 U/L (ref 33–130)
BILIRUBIN TOTAL: 0.2 mg/dL (ref 0.2–1.2)
BUN: 12 mg/dL (ref 7–25)
CO2: 23 mmol/L (ref 20–31)
CREATININE: 0.63 mg/dL (ref 0.50–1.05)
Calcium: 9.1 mg/dL (ref 8.6–10.4)
Chloride: 106 mmol/L (ref 98–110)
GLUCOSE: 90 mg/dL (ref 65–99)
Potassium: 4.6 mmol/L (ref 3.5–5.3)
SODIUM: 136 mmol/L (ref 135–146)
Total Protein: 6.9 g/dL (ref 6.1–8.1)

## 2016-10-16 LAB — CBC
HCT: 38.7 % (ref 35.0–45.0)
HEMOGLOBIN: 13.2 g/dL (ref 11.7–15.5)
MCH: 31.7 pg (ref 27.0–33.0)
MCHC: 34.1 g/dL (ref 32.0–36.0)
MCV: 92.8 fL (ref 80.0–100.0)
MPV: 9.9 fL (ref 7.5–12.5)
Platelets: 334 10*3/uL (ref 140–400)
RBC: 4.17 MIL/uL (ref 3.80–5.10)
RDW: 13.1 % (ref 11.0–15.0)
WBC: 6.5 10*3/uL (ref 3.8–10.8)

## 2016-10-17 LAB — T-HELPER CELL (CD4) - (RCID CLINIC ONLY)
CD4 T CELL HELPER: 43 % (ref 33–55)
CD4 T Cell Abs: 1740 /uL (ref 400–2700)

## 2016-10-18 ENCOUNTER — Other Ambulatory Visit: Payer: Medicare PPO

## 2016-10-18 LAB — HIV-1 RNA QUANT-NO REFLEX-BLD
HIV 1 RNA Quant: 27 copies/mL — ABNORMAL HIGH (ref <20)
HIV-1 RNA QUANT, LOG: 1.43 {Log_copies}/mL — AB (ref <1.30)

## 2016-11-01 ENCOUNTER — Ambulatory Visit (INDEPENDENT_AMBULATORY_CARE_PROVIDER_SITE_OTHER): Payer: Medicare PPO | Admitting: Infectious Diseases

## 2016-11-01 ENCOUNTER — Encounter: Payer: Self-pay | Admitting: Infectious Diseases

## 2016-11-01 VITALS — BP 128/88 | HR 63 | Temp 98.2°F | Ht 65.0 in | Wt 228.0 lb

## 2016-11-01 DIAGNOSIS — I10 Essential (primary) hypertension: Secondary | ICD-10-CM | POA: Diagnosis not present

## 2016-11-01 DIAGNOSIS — Z113 Encounter for screening for infections with a predominantly sexual mode of transmission: Secondary | ICD-10-CM

## 2016-11-01 DIAGNOSIS — B20 Human immunodeficiency virus [HIV] disease: Secondary | ICD-10-CM

## 2016-11-01 DIAGNOSIS — Z79899 Other long term (current) drug therapy: Secondary | ICD-10-CM | POA: Diagnosis not present

## 2016-11-01 NOTE — Assessment & Plan Note (Signed)
Doing very  Will continue atripla.  Has insurance for 5 more years.  Will get onto adap and HOPWA if she needs.  Offered/refused condoms.  Has gotten flu shot rtc I n6 months.

## 2016-11-01 NOTE — Assessment & Plan Note (Signed)
Well controlled.  Cont to f/u with PCP.  

## 2016-11-01 NOTE — Progress Notes (Signed)
   Subjective:    Patient ID: Niel Hummereatrich R Garry, female    DOB: 12/14/1960, 55 y.o.   MRN: [id number]  HPI 55 yo F with HIV+ 2007, HTN (on atenolol), hyperlipidemia (off pravachol), depression.  Has been on atripla since dx. Tried to change her to odefsy but her insurance would not cover. Last pap was March 2017. NL Mammo NL July 2017.  Believes she has missed a dose of meds (timing irreg) once.  Had URi around blood draw.  Not sexually active.  Has lost disability, has had to work more. Was unable to sleep.   HIV 1 RNA Quant (copies/mL)  Date Value  10/16/2016 27 (H)  04/21/2016 <20  02/04/2016 <20   CD4 T Cell Abs (/uL)  Date Value  10/16/2016 1,740  04/21/2016 1,240  02/04/2016 1,280    Review of Systems  Constitutional: Negative for appetite change and unexpected weight change.  Gastrointestinal: Negative for constipation and diarrhea.  Genitourinary: Negative for difficulty urinating and menstrual problem.       Objective:   Physical Exam  Constitutional: She appears well-developed and well-nourished.  HENT:  Mouth/Throat: No oropharyngeal exudate.  Eyes: EOM are normal. Pupils are equal, round, and reactive to light.  Neck: Neck supple.  Cardiovascular: Normal rate, regular rhythm and normal heart sounds.   Pulmonary/Chest: Effort normal and breath sounds normal.  Abdominal: Soft. Bowel sounds are normal. There is no tenderness.  Musculoskeletal: She exhibits no edema.  Lymphadenopathy:    She has no cervical adenopathy.          Assessment & Plan:

## 2016-11-29 ENCOUNTER — Ambulatory Visit: Payer: Medicare PPO | Admitting: Family Medicine

## 2016-12-18 ENCOUNTER — Other Ambulatory Visit: Payer: Self-pay | Admitting: Family Medicine

## 2016-12-18 DIAGNOSIS — H1010 Acute atopic conjunctivitis, unspecified eye: Secondary | ICD-10-CM

## 2016-12-19 ENCOUNTER — Telehealth: Payer: Self-pay

## 2016-12-19 NOTE — Telephone Encounter (Signed)
Pt called in upset stating that she'd loss her social security benefits and [place] med[place] benefits. She stated she only had 5 pills left and is worried that she will run out soon. Pt became emotional on the phone, I then advised her to calm down and told her that we have samples of Artripla here at the office that she can pick up for that time being. Pt stated that she'd come to the clinic to pick up Rx and will speak with Kathrine to see if she can be set up for ADAP during that time she stated she'd come in.

## 2016-12-22 ENCOUNTER — Other Ambulatory Visit: Payer: Self-pay | Admitting: Family Medicine

## 2016-12-22 DIAGNOSIS — E119 Type 2 diabetes mellitus without complications: Secondary | ICD-10-CM

## 2017-01-03 ENCOUNTER — Ambulatory Visit: Payer: Medicare PPO | Admitting: Family Medicine

## 2017-01-10 ENCOUNTER — Other Ambulatory Visit: Payer: Self-pay | Admitting: Family Medicine

## 2017-01-10 DIAGNOSIS — F411 Generalized anxiety disorder: Secondary | ICD-10-CM

## 2017-01-15 NOTE — Telephone Encounter (Signed)
Refill request  for lorazepam. Please advise. Last office visit 09/13/2017.

## 2017-01-15 NOTE — Telephone Encounter (Signed)
Patient refill request -

## 2017-01-22 ENCOUNTER — Other Ambulatory Visit: Payer: Self-pay | Admitting: Family Medicine

## 2017-01-22 DIAGNOSIS — J302 Other seasonal allergic rhinitis: Secondary | ICD-10-CM

## 2017-01-30 ENCOUNTER — Encounter: Payer: Self-pay | Admitting: Family Medicine

## 2017-01-30 ENCOUNTER — Ambulatory Visit (INDEPENDENT_AMBULATORY_CARE_PROVIDER_SITE_OTHER): Payer: Medicare HMO | Admitting: Family Medicine

## 2017-01-30 VITALS — BP 147/83 | HR 62 | Temp 98.3°F | Resp 18 | Ht 65.0 in | Wt 232.0 lb

## 2017-01-30 DIAGNOSIS — R7303 Prediabetes: Secondary | ICD-10-CM | POA: Diagnosis not present

## 2017-01-30 DIAGNOSIS — F411 Generalized anxiety disorder: Secondary | ICD-10-CM | POA: Diagnosis not present

## 2017-01-30 DIAGNOSIS — B20 Human immunodeficiency virus [HIV] disease: Secondary | ICD-10-CM

## 2017-01-30 DIAGNOSIS — R221 Localized swelling, mass and lump, neck: Secondary | ICD-10-CM | POA: Diagnosis not present

## 2017-01-30 DIAGNOSIS — J069 Acute upper respiratory infection, unspecified: Secondary | ICD-10-CM | POA: Diagnosis not present

## 2017-01-30 DIAGNOSIS — J3489 Other specified disorders of nose and nasal sinuses: Secondary | ICD-10-CM | POA: Diagnosis not present

## 2017-01-30 DIAGNOSIS — I10 Essential (primary) hypertension: Secondary | ICD-10-CM | POA: Diagnosis not present

## 2017-01-30 DIAGNOSIS — R519 Headache, unspecified: Secondary | ICD-10-CM

## 2017-01-30 DIAGNOSIS — R51 Headache: Secondary | ICD-10-CM

## 2017-01-30 LAB — CBC WITH DIFFERENTIAL/PLATELET
BASOS ABS: 0 {cells}/uL (ref 0–200)
BASOS PCT: 0 %
EOS ABS: 219 {cells}/uL (ref 15–500)
Eosinophils Relative: 3 %
HEMATOCRIT: 39.4 % (ref 35.0–45.0)
Hemoglobin: 12.9 g/dL (ref 11.7–15.5)
LYMPHS PCT: 50 %
Lymphs Abs: 3650 cells/uL (ref 850–3900)
MCH: 30.9 pg (ref 27.0–33.0)
MCHC: 32.7 g/dL (ref 32.0–36.0)
MCV: 94.5 fL (ref 80.0–100.0)
MONOS PCT: 6 %
MPV: 10 fL (ref 7.5–12.5)
Monocytes Absolute: 438 cells/uL (ref 200–950)
NEUTROS PCT: 41 %
Neutro Abs: 2993 cells/uL (ref 1500–7800)
Platelets: 322 10*3/uL (ref 140–400)
RBC: 4.17 MIL/uL (ref 3.80–5.10)
RDW: 13.2 % (ref 11.0–15.0)
WBC: 7.3 10*3/uL (ref 3.8–10.8)

## 2017-01-30 LAB — COMPLETE METABOLIC PANEL WITH GFR
ALT: 20 U/L (ref 6–29)
AST: 18 U/L (ref 10–35)
Albumin: 4 g/dL (ref 3.6–5.1)
Alkaline Phosphatase: 76 U/L (ref 33–130)
BUN: 13 mg/dL (ref 7–25)
CALCIUM: 9.2 mg/dL (ref 8.6–10.4)
CHLORIDE: 106 mmol/L (ref 98–110)
CO2: 24 mmol/L (ref 20–31)
Creat: 0.75 mg/dL (ref 0.50–1.05)
Glucose, Bld: 95 mg/dL (ref 65–99)
POTASSIUM: 4.4 mmol/L (ref 3.5–5.3)
Sodium: 139 mmol/L (ref 135–146)
Total Bilirubin: 0.2 mg/dL (ref 0.2–1.2)
Total Protein: 6.6 g/dL (ref 6.1–8.1)

## 2017-01-30 LAB — POCT URINALYSIS DIP (DEVICE)
Bilirubin Urine: NEGATIVE
Glucose, UA: NEGATIVE mg/dL
KETONES UR: NEGATIVE mg/dL
Leukocytes, UA: NEGATIVE
Nitrite: NEGATIVE
PH: 7 (ref 5.0–8.0)
PROTEIN: NEGATIVE mg/dL
SPECIFIC GRAVITY, URINE: 1.015 (ref 1.005–1.030)
Urobilinogen, UA: 0.2 mg/dL (ref 0.0–1.0)

## 2017-01-30 MED ORDER — LORAZEPAM 1 MG PO TABS: ORAL_TABLET | ORAL | 0 refills | Status: DC

## 2017-01-30 MED ORDER — CETIRIZINE HCL 10 MG PO TABS: 10.0000 mg | ORAL_TABLET | Freq: Every day | ORAL | 11 refills | Status: DC

## 2017-01-30 MED ORDER — AMOXICILLIN-POT CLAVULANATE 875-125 MG PO TABS: 1.0000 | ORAL_TABLET | Freq: Two times a day (BID) | ORAL | 0 refills | Status: DC

## 2017-01-30 NOTE — Progress Notes (Signed)
Subjective:    Patient ID: Sheri Davis, female    DOB: 1961-02-27, 56 y.o.   MRN: [id number]  HPI [name] is a 56 year old female with a history of HIV, anxiety,  hypertension, and prediabetes presents for a 3 month follow up of hypertension, prediabetes and anxiety.  [name] is here for follow up of hypertension and hyperlipidemia.  She is not exercising and is not adherent to low salt diet.  Patient denies dizziness,  chest pain, dyspnea, fatigue, lower extremity edema, orthopnea, palpitations, syncope and tachypnea.  Cardiovascular risk factors: dyslipidemia, obesity (BMI >= 30 kg/m2) and sedentary lifestyle.   Patient is also following up on prediabetes. Last hemoglobin A1C was 6.3%. Patient was not interested in starting medications at that point, so we discussed diet and exercise regimen at length. She has not been following a carbohydrate diet or exercise regimen. Patient denies foot ulcerations, increase appetite, nausea, paresthesia of the feet, polydipsia, polyuria, visual disturbances and weight loss.    Patient is also complaining of anxiety.  She has the following symptoms: difficulty concentrating, irritable, racing thoughts. She reports that she has post traumatic stress syndrome. She also reports a history of becoming anxious in when approaching new situations.  She denies current suicidal and homicidal ideation.  Previous treatment includes Ativan, BuSpar and Prozac.  She states that she has not been able to follow up with [facility] as discussed during previous appointment.   Past Medical History:  Diagnosis Date  . Allergy   . Asthma   . GERD (gastroesophageal reflux disease)   . HIV infection (HCC)   . Hyperlipidemia   . Tachycardia y-12   Pt was under stress at the time   Social History   Social History  . Marital status: Single    Spouse name: N/A  . Number of children: N/A  . Years of education: N/A    Occupational History  . Not on file.   Social History Main Topics  . Smoking status: Former Games developer  . Smokeless tobacco: Never Used  . Alcohol use Yes     Comment: occasional  . Drug use: No  . Sexual activity: No     Comment: given condoms   Other Topics Concern  . Not on file   Social History Narrative  . No narrative on file   Immunization History  Administered Date(s) Administered  . Influenza Split 09/08/2011, 08/14/2012  . Influenza Whole 08/28/2007, 09/09/2007, 09/14/2008, 09/24/2009, 09/12/2010  . Influenza,inj,Quad PF,36+ Mos 08/26/2014, 08/18/2015, 09/13/2016  . Influenza-Unspecified 08/25/2013  . PPD Test 01/29/2015, 02/04/2016, 07/01/2016  . Pneumococcal Conjugate-13 04/29/2015  . Pneumococcal Polysaccharide-23 08/28/2007, 01/29/2012  . Tdap 08/26/2014   Review of Systems  Constitutional: Positive for unexpected weight change (weight gain). Negative for fatigue and fever.  HENT: Positive for congestion, postnasal drip, sinus pain, sinus pressure and sore throat.   Eyes: Positive for itching.  Respiratory: Negative.   Cardiovascular: Negative.   Gastrointestinal: Negative.   Endocrine: Negative.  Negative for polydipsia, polyphagia and polyuria.  Genitourinary: Negative.   Musculoskeletal: Negative.   Skin: Negative.   Allergic/Immunologic: Positive for environmental allergies and immunocompromised state (HIV positive).  Neurological: Negative.  Negative for dizziness and light-headedness.  Hematological: Negative.   Psychiatric/Behavioral: The patient is nervous/anxious.        Objective:   Physical Exam  Constitutional: She is oriented to person, place, and time. She appears well-developed and well-nourished.  HENT:  Head: Normocephalic and atraumatic.  Right Ear: Hearing, tympanic membrane, external ear and ear canal normal.  Left Ear: Hearing, tympanic membrane, external ear and ear canal normal.  Nose: Mucosal edema (mucosa boggy) present.  Right sinus exhibits maxillary sinus tenderness and frontal sinus tenderness. Left sinus exhibits maxillary sinus tenderness and frontal sinus tenderness.  Mouth/Throat: Uvula is midline. Oropharyngeal exudate present.  Eyes: Conjunctivae, EOM and lids are normal. Pupils are equal, round, and reactive to light. Lids are everted and swept, no foreign bodies found.  Neck: Trachea normal and normal range of motion. Neck supple. No neck rigidity. Normal range of motion present. No thyroid mass and no thyromegaly present.    Cardiovascular: Normal rate, regular rhythm, normal heart sounds and intact distal pulses.   Pulmonary/Chest: Effort normal and breath sounds normal.  Abdominal: Soft. Bowel sounds are normal. She exhibits no distension (obesity). There is no tenderness.  Musculoskeletal: Normal range of motion.  Neurological: She is alert and oriented to person, place, and time. She has normal reflexes.  Skin: Skin is warm and dry.  Psychiatric: Her speech is normal and behavior is normal. Judgment and thought content normal. Her mood appears anxious. Her affect is angry. She expresses no suicidal ideation. She expresses no suicidal plans.      BP (!) 147/83 (BP Location: Left Arm, Patient Position: Sitting, Cuff Size: Large)   Pulse 62   Temp 98.3 F (36.8 C) (Oral)   Resp 18   Ht 5\' 5"  (1.651 m)   Wt 232 lb (105.2 kg)   SpO2 99%   BMI 38.61 kg/m  Assessment & Plan:  1. Essential hypertension Blood pressure is above goal on current medication regimen. The patient is asked to make an attempt to improve diet and exercise patterns to aid in medical management of this problem. - CBC with Differential - COMPLETE METABOLIC PANEL WITH GFR  2. Anxiety state GAD 7 : Generalized Anxiety Score 01/30/2017  Nervous, Anxious, on Edge 1  Control/stop worrying 2  Worry too much - different things 1  Trouble relaxing 1  Restless 2  Easily annoyed or irritable 1  Afraid - awful might happen 1   Total GAD 7 Score 9    - LORazepam (ATIVAN) 1 MG tablet; ONE TABLET EVERY 8 HOURS AS NEEDED  Dispense: 60 tablet; Refill: 0  3. Fullness of neck Round, raised, movable, non tender to palpation nodule of right neck.  - TSH - US Soft Tissue Head/Neck; Future  4. Prediabetes Recommend a lowfat, low carbohydrate diet divided over 5-6 small meals, increase water intake to 6-8 glasses, and 150 minutes per week of cardiovascular exercise.   - HgB A1c  5. Sinus headache - amoxicillin-clavulanate (AUGMENTIN) 875-125 MG tablet; Take 1 tablet by mouth 2 (two) times daily.  Dispense: 20 tablet; Refill: 0  6. Sinus pressure - amoxicillin-clavulanate (AUGMENTIN) 875-125 MG tablet; Take 1 tablet by mouth 2 (two) times daily.  Dispense: 20 tablet; Refill: 0 - cetirizine (ZYRTEC) 10 MG tablet; Take 1 tablet (10 mg total) by mouth daily.  Dispense: 30 tablet; Refill: 11  7. Upper respiratory tract infection, unspecified type - amoxicillin-clavulanate (AUGMENTIN) 875-125 MG tablet; Take 1 tablet by mouth 2 (two) times daily.  Dispense: 20 tablet; Refill: 0  8. Human immunodeficiency virus (HIV) disease (HCC) Recommend that patient follow up with Dr. Orvan Falconer as scheduled.   Routine Health Maintenance:  Will repeat fasting lipid panel.  She states that she had a full meal prior to previous lab.  Pap smear up  to date   Massie MaroonHollis,Ketrina Boateng M, FNP

## 2017-01-31 LAB — TSH: TSH: 0.81 mIU/L

## 2017-02-01 ENCOUNTER — Ambulatory Visit: Payer: Medicare HMO

## 2017-02-02 ENCOUNTER — Ambulatory Visit: Payer: Medicare HMO

## 2017-02-05 ENCOUNTER — Ambulatory Visit: Payer: Medicare HMO

## 2017-02-05 ENCOUNTER — Encounter: Payer: Self-pay | Admitting: Infectious Diseases

## 2017-02-07 ENCOUNTER — Ambulatory Visit: Payer: Medicare HMO

## 2017-02-08 ENCOUNTER — Ambulatory Visit: Admission: RE | Admit: 2017-02-08 | Payer: Medicare HMO | Source: Ambulatory Visit

## 2017-02-12 ENCOUNTER — Ambulatory Visit: Payer: Medicare HMO

## 2017-02-13 ENCOUNTER — Ambulatory Visit
Admission: RE | Admit: 2017-02-13 | Discharge: 2017-02-13 | Disposition: A | Discharge status: Home / Self Care | Payer: Medicare HMO | Source: Ambulatory Visit | Attending: Family Medicine | Admitting: Family Medicine

## 2017-02-13 DIAGNOSIS — R221 Localized swelling, mass and lump, neck: Secondary | ICD-10-CM | POA: Diagnosis not present

## 2017-02-14 ENCOUNTER — Other Ambulatory Visit: Payer: Self-pay | Admitting: *Deleted

## 2017-02-14 DIAGNOSIS — B20 Human immunodeficiency virus [HIV] disease: Secondary | ICD-10-CM

## 2017-02-14 MED ORDER — EFAVIRENZ-EMTRICITAB-TENOFOVIR 600-200-300 MG PO TABS: 1.0000 | ORAL_TABLET | Freq: Every day | ORAL | 11 refills | Status: DC

## 2017-02-15 ENCOUNTER — Other Ambulatory Visit: Payer: Self-pay | Admitting: Family Medicine

## 2017-02-15 DIAGNOSIS — R221 Localized swelling, mass and lump, neck: Secondary | ICD-10-CM

## 2017-02-16 ENCOUNTER — Ambulatory Visit: Payer: Medicare HMO

## 2017-02-20 ENCOUNTER — Other Ambulatory Visit: Payer: Self-pay | Admitting: Family Medicine

## 2017-02-20 DIAGNOSIS — K219 Gastro-esophageal reflux disease without esophagitis: Secondary | ICD-10-CM

## 2017-02-20 DIAGNOSIS — I10 Essential (primary) hypertension: Secondary | ICD-10-CM

## 2017-02-28 ENCOUNTER — Ambulatory Visit: Payer: Medicare HMO

## 2017-03-05 ENCOUNTER — Telehealth: Payer: Self-pay

## 2017-03-05 ENCOUNTER — Other Ambulatory Visit: Payer: Self-pay | Admitting: Family Medicine

## 2017-03-05 DIAGNOSIS — M7989 Other specified soft tissue disorders: Secondary | ICD-10-CM

## 2017-03-05 NOTE — Telephone Encounter (Signed)
Called patients insurance to get prior approval for MRI scheduled for 03/06/2017. This exam was approved and approval # is [id number] good from 03/06/17 to 04/05/17. However exam could not be preformed at [facility] regional Outpatient imaging due to patient having an HMO plan this was verified with eligibility department while speaking to a "Fayrene Fearing" reference number for this information is [id number].   I have called Rinaldo Cloud with [facility] center and left a message with approval number advising this has been approved.   I have called the scheduling number and spoke with "Toni Amend" and asked her to cancel the appointment for tomorrow with [facility] and have this rescheduled for [facility]. [place] states she would call and reschedule this with patient. Thanks!

## 2017-03-06 ENCOUNTER — Ambulatory Visit: Admission: RE | Admit: 2017-03-06 | Payer: Medicare HMO | Source: Ambulatory Visit

## 2017-03-09 ENCOUNTER — Ambulatory Visit: Payer: Medicare HMO

## 2017-03-20 ENCOUNTER — Ambulatory Visit: Payer: Medicare HMO

## 2017-03-23 ENCOUNTER — Ambulatory Visit: Payer: Medicare HMO

## 2017-04-02 ENCOUNTER — Other Ambulatory Visit: Payer: Self-pay | Admitting: Family Medicine

## 2017-04-02 NOTE — Progress Notes (Unsigned)
Armeniahina I received a call from Karna ChristmasPeggy Ingram regarding the MR NECK scheduled for this patient tomorrow at 8:00. The test had to be cancelled as insurance would only authorize a MRI of the upper right joint. In order to proceed with the MRI of Neck, you will need to reorder and obtain a prior authorization.  Godfrey PickKimberly S. Tiburcio PeaHarris, MSN, [facility]NP-C [facility] [address] [place], [place] [address] [phone]

## 2017-04-03 ENCOUNTER — Ambulatory Visit: Payer: Medicare HMO

## 2017-04-13 ENCOUNTER — Other Ambulatory Visit: Payer: Medicare HMO

## 2017-04-13 ENCOUNTER — Other Ambulatory Visit (HOSPITAL_COMMUNITY)
Admission: RE | Admit: 2017-04-13 | Discharge: 2017-04-13 | Disposition: A | Discharge status: Home / Self Care | Payer: Medicare HMO | Source: Ambulatory Visit | Attending: Infectious Diseases | Admitting: Infectious Diseases

## 2017-04-13 ENCOUNTER — Ambulatory Visit (INDEPENDENT_AMBULATORY_CARE_PROVIDER_SITE_OTHER): Payer: Medicare HMO | Admitting: *Deleted

## 2017-04-13 DIAGNOSIS — Z113 Encounter for screening for infections with a predominantly sexual mode of transmission: Secondary | ICD-10-CM | POA: Diagnosis not present

## 2017-04-13 DIAGNOSIS — Z79899 Other long term (current) drug therapy: Secondary | ICD-10-CM

## 2017-04-13 DIAGNOSIS — B20 Human immunodeficiency virus [HIV] disease: Secondary | ICD-10-CM

## 2017-04-13 DIAGNOSIS — Z124 Encounter for screening for malignant neoplasm of cervix: Secondary | ICD-10-CM

## 2017-04-13 LAB — COMPREHENSIVE METABOLIC PANEL
ALK PHOS: 76 U/L (ref 33–130)
ALT: 32 U/L — AB (ref 6–29)
AST: 27 U/L (ref 10–35)
Albumin: 4.3 g/dL (ref 3.6–5.1)
BUN: 13 mg/dL (ref 7–25)
CALCIUM: 9.4 mg/dL (ref 8.6–10.4)
CHLORIDE: 105 mmol/L (ref 98–110)
CO2: 26 mmol/L (ref 20–31)
Creat: 0.7 mg/dL (ref 0.50–1.05)
Glucose, Bld: 123 mg/dL — ABNORMAL HIGH (ref 65–99)
POTASSIUM: 4.6 mmol/L (ref 3.5–5.3)
Sodium: 140 mmol/L (ref 135–146)
Total Bilirubin: 0.3 mg/dL (ref 0.2–1.2)
Total Protein: 6.9 g/dL (ref 6.1–8.1)

## 2017-04-13 LAB — CBC
HCT: 39.4 % (ref 35.0–45.0)
Hemoglobin: 13.3 g/dL (ref 11.7–15.5)
MCH: 31.8 pg (ref 27.0–33.0)
MCHC: 33.8 g/dL (ref 32.0–36.0)
MCV: 94.3 fL (ref 80.0–100.0)
MPV: 9.9 fL (ref 7.5–12.5)
PLATELETS: 334 10*3/uL (ref 140–400)
RBC: 4.18 MIL/uL (ref 3.80–5.10)
RDW: 13.1 % (ref 11.0–15.0)
WBC: 8.1 10*3/uL (ref 3.8–10.8)

## 2017-04-13 LAB — LIPID PANEL
CHOL/HDL RATIO: 3.5 ratio (ref <5.0)
Cholesterol: 271 mg/dL — ABNORMAL HIGH (ref <200)
HDL: 78 mg/dL (ref >50)
LDL CALC: 161 mg/dL — AB (ref <100)
Triglycerides: 158 mg/dL — ABNORMAL HIGH (ref <150)
VLDL: 32 mg/dL — AB (ref <30)

## 2017-04-13 LAB — T-HELPER CELL (CD4) - (RCID CLINIC ONLY)
CD4 % Helper T Cell: 39 % (ref 33–55)
CD4 T CELL ABS: 1600 /uL (ref 400–2700)

## 2017-04-13 NOTE — Progress Notes (Signed)
Subjective:     Sheri Davis is a 56 y.o. woman who comes in today for a  pap smear only.  Previous abnormal Pap smears: no. Contraception: condoms.  [place] in [place] and had an U/S.  Still having upper chest soft tissue swelling which is concerning her.  RN encouraged the patient to send Dr. Ninetta LightsHatcher a MyChart message since she would like him to review the U/S results.  Patient does have an upcoming appt with Dr. Ninetta LightsHatcher.  Objective:    There were no vitals taken for this visit. Pelvic Exam:  Pap smear obtained.   Assessment:    Screening pap smear.   Plan:    Follow up in one year, or as indicated by Pap results.   Patient given condoms.

## 2017-04-13 NOTE — Patient Instructions (Addendum)
Patient will check on MyChart next week for her results. Patient to contact Dr. Ninetta LightsHatcher via MyChart to ask about recent upper chest U/S.

## 2017-04-14 LAB — RPR

## 2017-04-16 LAB — CYTOLOGY - PAP
ADEQUACY: ABSENT
Diagnosis: NEGATIVE

## 2017-04-16 LAB — HIV-1 RNA QUANT-NO REFLEX-BLD
HIV 1 RNA Quant: <20 copies/mL
HIV-1 RNA Quant, Log: <1.3 Log copies/mL

## 2017-04-16 LAB — CERVICOVAGINAL ANCILLARY ONLY
Chlamydia: NEGATIVE
NEISSERIA GONORRHEA: NEGATIVE

## 2017-04-18 ENCOUNTER — Other Ambulatory Visit: Payer: Medicare PPO

## 2017-05-02 ENCOUNTER — Ambulatory Visit: Payer: Medicare PPO | Admitting: Infectious Diseases

## 2017-05-21 ENCOUNTER — Other Ambulatory Visit: Payer: Self-pay

## 2017-05-21 DIAGNOSIS — I10 Essential (primary) hypertension: Secondary | ICD-10-CM

## 2017-05-21 DIAGNOSIS — H1010 Acute atopic conjunctivitis, unspecified eye: Secondary | ICD-10-CM

## 2017-05-21 MED ORDER — CROMOLYN SODIUM 4 % OP SOLN: OPHTHALMIC | 2 refills | Status: DC

## 2017-05-21 MED ORDER — ATENOLOL 25 MG PO TABS: 25.0000 mg | ORAL_TABLET | Freq: Every day | ORAL | 1 refills | Status: DC

## 2017-05-21 MED ORDER — ALBUTEROL SULFATE HFA 108 (90 BASE) MCG/ACT IN AERS: INHALATION_SPRAY | RESPIRATORY_TRACT | 3 refills | Status: DC

## 2017-06-04 ENCOUNTER — Other Ambulatory Visit: Payer: Self-pay | Admitting: Family Medicine

## 2017-06-04 DIAGNOSIS — Z1231 Encounter for screening mammogram for malignant neoplasm of breast: Secondary | ICD-10-CM

## 2017-06-20 ENCOUNTER — Ambulatory Visit (INDEPENDENT_AMBULATORY_CARE_PROVIDER_SITE_OTHER): Payer: Medicare HMO | Admitting: Infectious Diseases

## 2017-06-20 ENCOUNTER — Ambulatory Visit: Payer: Medicare HMO

## 2017-06-20 ENCOUNTER — Encounter: Payer: Self-pay | Admitting: Infectious Diseases

## 2017-06-20 VITALS — BP 118/80 | HR 66 | Temp 98.1°F | Wt 228.0 lb

## 2017-06-20 DIAGNOSIS — B20 Human immunodeficiency virus [HIV] disease: Secondary | ICD-10-CM

## 2017-06-20 DIAGNOSIS — Z79899 Other long term (current) drug therapy: Secondary | ICD-10-CM

## 2017-06-20 DIAGNOSIS — R739 Hyperglycemia, unspecified: Secondary | ICD-10-CM

## 2017-06-20 DIAGNOSIS — Z113 Encounter for screening for infections with a predominantly sexual mode of transmission: Secondary | ICD-10-CM

## 2017-06-20 DIAGNOSIS — F3289 Other specified depressive episodes: Secondary | ICD-10-CM

## 2017-06-20 DIAGNOSIS — I10 Essential (primary) hypertension: Secondary | ICD-10-CM

## 2017-06-20 DIAGNOSIS — R221 Localized swelling, mass and lump, neck: Secondary | ICD-10-CM | POA: Diagnosis not present

## 2017-06-20 NOTE — Assessment & Plan Note (Signed)
She is doing well.  Offered/refused condoms.  Will change her ART when her insurance allows.  rtc in 6 months.

## 2017-06-20 NOTE — Assessment & Plan Note (Signed)
Well-controlled on beta-blocker. 

## 2017-06-20 NOTE — Progress Notes (Signed)
   Subjective:    Patient ID: Sheri Davis, female    DOB: 02/27/1961, 56 y.o.   MRN: [id number]  HPI 56 yo F with HIV+ 2007, HTN (on atenolol), hyperlipidemia (off pravachol), depression.  Has been on atripla since dx. Tried to change her to odefsy but her insurance would not cover. Last pap was 03-2017. NL Mammo NL July 2017. Repeat sched 06-2017.   At her last PCP visit she noted a nodule in her neck. 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 A periarticular soft tissue mass cannot be excluded. Further evaluation with a MRI of the sternoclavicular joints is recommended. Her insurance balked at a MRI.  She complains of R shoulder pain.  Has been using topicals with some relief. Takes tylenol as well.   HIV 1 RNA Quant (copies/mL)  Date Value  04/13/2017 <20 NOT DETECTED  10/16/2016 27 (H)  04/21/2016 <20   CD4 T Cell Abs (/uL)  Date Value  04/13/2017 1,600  10/16/2016 1,740  04/21/2016 1,240    wants to know if her labs are from "positive thinking and living a healthy life".  Lowered her A1C to 6.0 (08-2016) with diet. Doesn't exercise except work. Quit taking metformin.   Review of Systems  Constitutional: Negative for appetite change and unexpected weight change.  Gastrointestinal: Negative for constipation and diarrhea.  Genitourinary: Negative for difficulty urinating.  Musculoskeletal: Positive for arthralgias and myalgias.  Psychiatric/Behavioral: Positive for dysphoric mood.  gets depressed about having HIV, worked "my way off of the meds"     Objective:   Physical Exam  Constitutional: She appears well-developed and well-nourished.  HENT:  Mouth/Throat: No oropharyngeal exudate.  Eyes: Pupils are  equal, round, and reactive to light. EOM are normal.  Neck: Neck supple.  Swelling of lower neck. No mass.   Cardiovascular: Normal rate, regular rhythm and normal heart sounds.   Pulmonary/Chest: Effort normal and breath sounds normal.  Abdominal: Soft. Bowel sounds are normal. There is no tenderness. There is no rebound.  Musculoskeletal: She exhibits no edema.  Lymphadenopathy:    She has no cervical adenopathy.      Assessment & Plan:

## 2017-06-20 NOTE — Assessment & Plan Note (Signed)
Quit metformin.  Appreciate La[place]'s f/u.

## 2017-06-20 NOTE — Assessment & Plan Note (Signed)
Offered for her to see Cordelia PenSherry. She defers, wishes to continue prayer.

## 2017-06-20 NOTE — Assessment & Plan Note (Addendum)
Appreciate Sheri Davis's f/u.  Consider MRI? As her insurance allows.Marland Kitchen..Marland Kitchen

## 2017-06-22 ENCOUNTER — Encounter: Payer: Self-pay | Admitting: Infectious Diseases

## 2017-07-03 ENCOUNTER — Ambulatory Visit
Admission: RE | Admit: 2017-07-03 | Discharge: 2017-07-03 | Disposition: A | Discharge status: Home / Self Care | Payer: Medicare HMO | Source: Ambulatory Visit | Attending: Family Medicine | Admitting: Family Medicine

## 2017-07-03 DIAGNOSIS — Z1231 Encounter for screening mammogram for malignant neoplasm of breast: Secondary | ICD-10-CM | POA: Diagnosis not present

## 2017-07-11 ENCOUNTER — Ambulatory Visit: Payer: Self-pay | Admitting: Family Medicine

## 2017-07-18 ENCOUNTER — Encounter: Payer: Self-pay | Admitting: Family Medicine

## 2017-07-18 ENCOUNTER — Ambulatory Visit (INDEPENDENT_AMBULATORY_CARE_PROVIDER_SITE_OTHER): Payer: Medicare HMO | Admitting: Family Medicine

## 2017-07-18 VITALS — BP 110/75 | HR 61 | Temp 98.5°F | Resp 18 | Ht 65.0 in | Wt 227.2 lb

## 2017-07-18 DIAGNOSIS — I10 Essential (primary) hypertension: Secondary | ICD-10-CM

## 2017-07-18 DIAGNOSIS — B20 Human immunodeficiency virus [HIV] disease: Secondary | ICD-10-CM | POA: Diagnosis not present

## 2017-07-18 DIAGNOSIS — E785 Hyperlipidemia, unspecified: Secondary | ICD-10-CM

## 2017-07-18 DIAGNOSIS — R7303 Prediabetes: Secondary | ICD-10-CM

## 2017-07-18 DIAGNOSIS — F411 Generalized anxiety disorder: Secondary | ICD-10-CM

## 2017-07-18 DIAGNOSIS — Z23 Encounter for immunization: Secondary | ICD-10-CM | POA: Diagnosis not present

## 2017-07-18 DIAGNOSIS — K219 Gastro-esophageal reflux disease without esophagitis: Secondary | ICD-10-CM | POA: Diagnosis not present

## 2017-07-18 DIAGNOSIS — J4520 Mild intermittent asthma, uncomplicated: Secondary | ICD-10-CM | POA: Diagnosis not present

## 2017-07-18 LAB — POCT GLYCOSYLATED HEMOGLOBIN (HGB A1C): HEMOGLOBIN A1C: 5.9

## 2017-07-18 LAB — POCT URINALYSIS DIP (DEVICE)
Bilirubin Urine: NEGATIVE
GLUCOSE, UA: NEGATIVE mg/dL
Hgb urine dipstick: NEGATIVE
Ketones, ur: NEGATIVE mg/dL
LEUKOCYTES UA: NEGATIVE
NITRITE: NEGATIVE
PROTEIN: NEGATIVE mg/dL
SPECIFIC GRAVITY, URINE: 1.015 (ref 1.005–1.030)
UROBILINOGEN UA: 0.2 mg/dL (ref 0.0–1.0)
pH: 5.5 (ref 5.0–8.0)

## 2017-07-18 LAB — GLUCOSE, CAPILLARY: GLUCOSE-CAPILLARY: 100 mg/dL — AB (ref 65–99)

## 2017-07-18 MED ORDER — FLUTICASONE PROPIONATE 50 MCG/ACT NA SUSP: NASAL | 5 refills | Status: DC

## 2017-07-18 MED ORDER — CETIRIZINE HCL 10 MG PO TABS: 10.0000 mg | ORAL_TABLET | Freq: Every day | ORAL | 11 refills | Status: DC

## 2017-07-18 MED ORDER — LANSOPRAZOLE 30 MG PO CPDR: DELAYED_RELEASE_CAPSULE | ORAL | 1 refills | Status: DC

## 2017-07-18 MED ORDER — FLUTICASONE PROPIONATE HFA 110 MCG/ACT IN AERO: INHALATION_SPRAY | RESPIRATORY_TRACT | 5 refills | Status: DC

## 2017-07-18 MED ORDER — ALBUTEROL SULFATE HFA 108 (90 BASE) MCG/ACT IN AERS: INHALATION_SPRAY | RESPIRATORY_TRACT | 5 refills | Status: DC

## 2017-07-18 MED ORDER — ATENOLOL 25 MG PO TABS: 25.0000 mg | ORAL_TABLET | Freq: Every day | ORAL | 1 refills | Status: DC

## 2017-07-18 MED ORDER — LORAZEPAM 1 MG PO TABS: ORAL_TABLET | ORAL | 0 refills | Status: DC

## 2017-07-18 NOTE — Patient Instructions (Addendum)
Your A1C goal is less than 7.  Your fasting blood sugar  Upon awakening goal is between 110-140.  Your LDL  (bad cholesterol goal is less than 100 Blood pressure goal is <140/90.  Recommend a lowfat, low carbohydrate diet divided over 5-6 small meals, increase water intake to 6-8 glasses, and 150 minutes per week of cardiovascular exercise.   Take your medications as prescribed Make sure that you are familiar with each one of your medications and what they are used to treat.  If you are unsure of medications, please bring to follow up Continue scheduled eye exams Please keep your scheduled follow up appointment.    Immunization Schedule, Adult Recommended immunizations These include:  Influenza vaccine. ? All adults should be immunized every year. ? All adults, including pregnant women and people with hives-only allergy to eggs can receive the inactivated influenza vaccine (IIV) or recombinant influenza vaccine (RIV). ? Adults aged 18-64 years can receive the IIV or RIV. The RIV vaccine does not contain any egg protein. ? Adults aged 27 years or older can receive the high-dose or adjuvanted IIV.  Tetanus, diphtheria, and acellular pertussis (Td, Tdap) vaccine. ? Pregnant women should receive 1 dose of Tdap vaccine during each pregnancy. The dose should be obtained regardless of the length of time since the last dose. Immunization is preferred during the 27th to 36th week of gestation. ? An adult who has not previously received Tdap or who does not know his or her vaccine status should receive 1 dose of Tdap. This initial dose should be followed by tetanus and diphtheria toxoids (Td) booster doses every 10 years. ? Adults with an unknown or incomplete history of completing a 3-dose immunization series with Td-containing vaccines should begin or complete a primary immunization series including a Tdap dose. The first 2 doses should be received at least 4 weeks apart, and the third dose 6-12  months after the second dose. ? Adults should receive a Td booster every 10 years.  Varicella vaccine. ? An adult without evidence of immunity to varicella should receive 2 doses 4-8 weeks apart, or a second dose if he or she has previously received 1 dose. ? Pregnant females who do not have evidence of immunity should receive the first dose after pregnancy. This first dose should be obtained before leaving the health care facility. The second dose should be obtained 4-8 weeks after the first dose. ? All healthcare workers should have evidence of immunity to varicella. ? Adults with cancer or those who are on therapy to suppress the immune system should not receive the varicella vaccine.  Human papillomavirus (HPV) vaccine. ? Females aged 13-26 years who have not received the vaccine previously should obtain the 3-dose series. Females should receive the second dose 1-2 months after the first dose, and the third dose 6 months after the first dose. ? The vaccine is not recommended for use in pregnant females. However, pregnancy testing is not needed before receiving a dose. If a female is found to be pregnant after receiving a dose, no treatment is needed. In that case, the remaining doses should be delayed until after the pregnancy. ? Males aged 66-21 years who have not received the vaccine previously should receive the 3-dose series. Males aged 22-26 years may also receive a 3 dose series. Males should receive the second dose 1-2 months after the first dose, and the third dose 6 months after the first dose. ? Adult females up to age 92 years and  adult males up to age 42 years who initiated the HPV vaccine series before age 27 years and received 2 doses at least 5 months apart do not need an additional dose of the vaccine. ? Adult females up to age 69 years and adult female up to age 30 years who initiated the HPV vaccine series before 15 years but only received 1 dose or 2 doses less than 5 months apart  should receive 1 additional dose of the vaccine. ? Immunization is recommended through the age of 41 years for any female who has sex with males and did not get any or all doses earlier. ? Immunization is recommended for any person with an immunocompromised condition through the age of 74 years if he or she did not get any or all doses earlier.  Zoster vaccine. ? One dose is recommended for adults aged 25 years or older unless certain conditions are present.  Measles, mumps, and rubella (MMR) vaccine. ? Adults born before 26 generally are considered immune to measles and mumps. ? Adults born in [birth year] or later should have 1 or more doses of MMR vaccine unless there is a contraindication to the vaccine or there is laboratory evidence of immunity to each of the three diseases. ? A routine second dose of MMR vaccine should be obtained at least 28 days after the first dose for students attending postsecondary schools, health care workers, or international travelers. ? People who received inactivated measles vaccine or an unknown type of measles vaccine during 1963-1967 should be revaccinated with 1 or 2 doses of MMR vaccine. ? People who received inactivated mumps vaccine or an unknown type of mumps vaccine before 1979 and are at high risk for mumps infection should consider immunization with 2 doses of MMR vaccine. ? For females of childbearing age, rubella immunity should be determined. If there is no evidence of immunity, females who are not pregnant should receive 1 dose of MMR. If there is no evidence of immunity, females who are pregnant should receive 1 dose of MMR after pregnancy and before leaving the healthcare facility. ? Unvaccinated health care workers born before [birth year] who lack laboratory evidence of measles, mumps, or rubella immunity or laboratory confirmation of disease should consider 2 doses of MMR 18 days apart for measles or mumps, or 1 dose of MMR for rubella.  Pneumococcal  vaccines. ? All adults aged 3 years and older should receive 13-valent pneumococcal conjugate vaccine (PCV13) followed by 23-valent pneumococcal polyscaccharide vaccine (PPSV23) at least 1 year after PCV13. ? An adult aged 70 years or older who has certain medical conditions and has not been previously immunized should receive 1 dose of PCV13 vaccine. This PCV13 should be followed with a dose of pneumococcal polysaccharide (PPSV23) vaccine. The PPSV23 vaccine dose should be obtained at least 8 weeks after the dose of PCV13 vaccine. ? An adult aged 71 years or older who has certain medical conditions and previously received 1 or more doses of PPSV23 vaccine should receive 1 dose of PCV13. The PCV13 vaccine dose should be obtained 1 or more years after the last PPSV23 vaccine dose. ? PPSV23 vaccination should happen in all adults aged 19-64 who smoke cigarettes. ? People with an immunocompromised condition and certain other conditions should receive both PCV13 and PPSV23 vaccines. ? When indicated, people who have unknown immunization and have no record of immunization should receive PPSV23 vaccine. ? People who received 1-2 doses of PPSV23 before age 69 years should receive another dose  of PPSV23 vaccine at age 87 years or later if at least 5 years have passed since the previous dose. ? Doses of PPSV23 are not needed for people immunized with PPSV23 at or after age 31 years.  Meningococcal vaccine. ? Adults with asplenia or persistent complement component deficiencies should receive 2 doses of quadrivalent meningococcal conjugate (MenACWY-D) vaccine. The doses should be obtained at least 2 months apart. Revaccination should occur every 5 years. A 2-dose or 3-dose series of serogroup B meningococcal (MenB)vaccine should also be obtained. ? Microbiologists working with certain meningococcal bacteria, military recruits, people at risk during an outbreak, and people who travel to or live in countries with a  high rate of meningitis should be immunized. Revaccination is recommended every 5 years if the risk for infection remains. ? Adults with HIV infection who have not been previously vaccinated should receive a 2-dose MenACWY series with doses at least 2 months apart. If 1 dose was received previously, a second dose should be obtained at least 2 months later. Revaccination is recommended every 5 years. ? A first-year college student up through age 90 years who is living in a residence hall should receive a dose if he or she did not receive a dose on or after his or her 16th birthday. ? Adults aged 16-23 years may receive 2 doses of MenB vaccine for short-term protection against most strains of serogroup B meningococcal disease.  Hepatitis A vaccine. ? Adults who wish to be protected from this disease, have certain high-risk conditions, work with hepatitis A-infected animals, work in hepatitis A research labs, or travel to or work in countries with a high rate of hepatitis A should be immunized. ? Adults who were previously unvaccinated and who anticipate close contact with an international adoptee during the first 60 days after arrival in the [place] from a country with a high rate of hepatitis A should be immunized. ? The vaccine may be given as a 2 or 3-dose series by itself or in combination with the hepatitis B vaccine (HepB).  Hepatitis B vaccine. ? Adults who wish to be protected from this disease, may be exposed to blood or other infectious body fluids, are household contacts or sex partners of hepatitis B positive people, are clients or workers in certain care facilities, or travel to or work in countries with a high rate of hepatitis B should be immunized. ? Adults with chronic liver disease, such as hepatitis C infection, cirrhosis, fatty liver disease, alcoholic liver disease, autoimmune hepatitis, and elevated liver chemistry levels should be vaccinated. ? Pregnant women who are at risk  for hepatitis B virus infection during pregnancy should be immunized. ? The vaccine may be given as a 3-dose series by itself or in combination with the hepatitis A vaccine (HepA).  Haemophilus influenzae type b (Hib) vaccine. ? A previously unvaccinated person with asplenia or sickle cell disease or having a scheduled splenectomy should receive 1 dose of Hib vaccine. This should happen at least 14 days before the procedure. ? Regardless of previous immunization, a recipient of a hematopoietic stem cell transplant should receive a 3-dose series, with at least 4 weeks between doses, 6-12 months after his or her successful transplant. ? Hib vaccine is not recommended for adults with HIV infection.  This information is not intended to replace advice given to you by your health care provider. Make sure you discuss any questions you have with your health care provider. Document Released: 02/03/2004 Document Revised: 07/26/2016 Document  Reviewed: 07/26/2016 Elsevier Interactive Patient Education  [employer].

## 2017-07-18 NOTE — Progress Notes (Signed)
Subjective:    Patient ID: Sheri Davis, female    DOB: 06-19-1961, 56 y.o.   MRN: [id number]  HPI Sheri Davis is a 56 year old female with a history of HIV, anxiety,  hypertension, and prediabetes presents for a 3 month follow up of chronic conditions. She says that she feels well and is without complaint. Sheri Davis is here for follow up of hypertension and hyperlipidemia.  She is not exercising routinely and is not adherent to low salt diet.  Patient denies dizziness,  chest pain, dyspnea, fatigue, lower extremity edema, orthopnea, palpitations, syncope and tachypnea.  Cardiovascular risk factors: dyslipidemia, obesity (BMI >= 30 kg/m2) and sedentary lifestyle. Patient is also following up on prediabetes. Last hemoglobin A1C was 6.3%. Patient denies foot ulcerations, increase appetite, nausea, paresthesia of the feet, polydipsia, polyuria, visual disturbances and weight loss.    Patient is also complaining of periodic anxiety.  She has the following symptoms: difficulty concentrating, irritable, racing thoughts. She reports that she has post traumatic stress syndrome. She also reports a history of becoming anxious in when approaching new situations.  She denies current suicidal and homicidal ideation.  Previous treatment includes Ativan, BuSpar and Prozac.  She states that she has not been able to follow up with [facility] as discussed during previous appointment. She typically takes Ativan with acute panic attacks and anxiety exacerbation with maximum relief.   Past Medical History:  Diagnosis Date  . Allergy   . Asthma   . GERD (gastroesophageal reflux disease)   . HIV infection (HCC)   . Hyperlipidemia   . Tachycardia y-12   Pt was under stress at the time   Social History   Social History  . Marital status: Single    Spouse name: N/A  . Number of children: N/A  . Years of education: N/A   Occupational History  . Not on file.   Social  History Main Topics  . Smoking status: Never Smoker  . Smokeless tobacco: Never Used  . Alcohol use Yes     Comment: occasional  . Drug use: No  . Sexual activity: No     Comment: given condoms   Other Topics Concern  . Not on file   Social History Narrative  . No narrative on file   Immunization History  Administered Date(s) Administered  . Influenza Split 09/08/2011, 08/14/2012  . Influenza Whole 08/28/2007, 09/09/2007, 09/14/2008, 09/24/2009, 09/12/2010  . Influenza,inj,Quad PF,6+ Mos 08/26/2014, 08/18/2015, 09/13/2016  . Influenza-Unspecified 08/25/2013  . PPD Test 01/29/2015, 02/04/2016, 07/01/2016  . Pneumococcal Conjugate-13 04/29/2015  . Pneumococcal Polysaccharide-23 08/28/2007, 01/29/2012  . Tdap 08/26/2014   Review of Systems  Constitutional: Positive for unexpected weight change (weight gain). Negative for fatigue and fever.  HENT: Positive for postnasal drip and sore throat.   Respiratory: Negative.   Cardiovascular: Negative.   Gastrointestinal: Negative.   Endocrine: Negative.  Negative for polydipsia, polyphagia and polyuria.  Genitourinary: Negative.   Musculoskeletal: Negative.   Skin: Negative.   Allergic/Immunologic: Positive for environmental allergies and immunocompromised state (HIV positive).  Neurological: Negative.  Negative for dizziness and light-headedness.  Hematological: Negative.   Psychiatric/Behavioral: The patient is nervous/anxious.        Objective:   Physical Exam  Constitutional: She is oriented to person, place, and time. She appears well-developed and well-nourished.  HENT:  Head: Normocephalic and atraumatic.  Right Ear: Hearing, tympanic membrane, external ear and ear canal normal.  Left Ear: Hearing, tympanic membrane, external ear  and ear canal normal.  Mouth/Throat: Uvula is midline.  Eyes: Pupils are equal, round, and reactive to light. Conjunctivae, EOM and lids are normal. Lids are everted and swept, no foreign bodies  found.  Neck: Trachea normal and normal range of motion. Neck supple. No neck rigidity. Normal range of motion present. No thyroid mass and no thyromegaly present.  Cardiovascular: Normal rate, regular rhythm, normal heart sounds and intact distal pulses.   Pulmonary/Chest: Effort normal and breath sounds normal.  Abdominal: Soft. Bowel sounds are normal. She exhibits no distension (obesity). There is no tenderness.  Musculoskeletal: Normal range of motion.  Neurological: She is alert and oriented to person, place, and time. She has normal reflexes.  Skin: Skin is warm and dry.  Psychiatric: Her behavior is normal. Judgment and thought content normal. Her mood appears not anxious. Her speech is not rapid and/or pressured. She expresses no homicidal and no suicidal ideation. She expresses no suicidal plans.      BP 110/75 (BP Location: Left Arm, Patient Position: Sitting)   Pulse 61   Temp 98.5 F (36.9 C) (Oral)   Resp 18   Ht 5\' 5"  (1.651 m)   Wt 227 lb 3.2 oz (103.1 kg)   SpO2 100%   BMI 37.81 kg/m  Assessment & Plan:  1. Essential hypertension Blood pressure is at goal on current medication regimen, no medication changes warranted on today.  Reviewed urinalysis, no proteinuria present Will review renal functioning as results become available.  - atenolol (TENORMIN) 25 MG tablet; Take 1 tablet (25 mg total) by mouth daily.  Dispense: 90 tablet; Refill: 1 - BASIC METABOLIC PANEL WITH GFR - POCT urinalysis dip (device) - Glucose, capillary  2. Anxiety state GAD 7 : Generalized Anxiety Score 07/18/2017 01/30/2017  Nervous, Anxious, on Edge 0 1  Control/stop worrying 1 2  Worry too much - different things 1 1  Trouble relaxing 1 1  Restless 0 2  Easily annoyed or irritable 1 1  Afraid - awful might happen 0 1  Total GAD 7 Score 4 9  Anxiety Difficulty Not difficult at all -     - LORazepam (ATIVAN) 1 MG tablet; ONE TABLET EVERY 8 HOURS AS NEEDED  Dispense: 60 tablet; Refill:  0  3. Gastroesophageal reflux disease without esophagitis - lansoprazole (PREVACID) 30 MG capsule; TAKE 1 CAPSULE BY MOUTH DAILY AT NOON  Dispense: 90 capsule; Refill: 1  4. Human immunodeficiency virus (HIV) disease (HCC) Reviewed recent labs. Follow up with Dr. Ninetta Lights in 6 months as scheduled.   5. Hyperlipidemia LDL goal <100 The 10-year ASCVD risk score Denman George DC Jr., et al., 2013) is: 6.6%   Values used to calculate the score:     Age: 47 years     Sex: Female     Is Non-Hispanic African American: Yes     Diabetic: Yes     Tobacco smoker: No     Systolic Blood Pressure: 110 mmHg     Is BP treated: Yes     HDL Cholesterol: 78 mg/dL     Total Cholesterol: 278 mg/dL - Lipid Panel  6. Mild intermittent asthma without complication - fluticasone (FLOVENT HFA) 110 MCG/ACT inhaler; INHALE ONE PUFF INTO THE LUNGS DAILY AS NEEDED FOR SHORTNESS OF BREATH  Dispense: 12 g; Refill: 5 - fluticasone (FLONASE) 50 MCG/ACT nasal spray; INSTILL 1 SPRAY IN EACH NOSTRIL ONCE A DAY  Dispense: 16 g; Refill: 5 - albuterol (VENTOLIN HFA) 108 (90 Base) MCG/ACT inhaler; INHALE  1 PUFF INTO THE LUNGS EVERY 4 HOURS AS NEEDED FOR SHORTNESS OFBREATH  Dispense: 18 g; Refill: 5 - cetirizine (ZYRTEC) 10 MG tablet; Take 1 tablet (10 mg total) by mouth daily.  Dispense: 30 tablet; Refill: 11  7. Prediabetes - HgB A1c - POCT urinalysis dip (device)  8. Influenza vaccination administered at current visit - Flu Vaccine QUAD 6+ mos PF IM (Fluarix Quad PF)   RTC: 6 months for chronic conditions   Taris Galindo Rennis Petty  MSN, FNP-C Patient Care [facility] [address] [place], [place] [address] [phone]

## 2017-07-19 ENCOUNTER — Other Ambulatory Visit: Payer: Self-pay | Admitting: Family Medicine

## 2017-07-19 DIAGNOSIS — E7849 Other hyperlipidemia: Secondary | ICD-10-CM

## 2017-07-19 LAB — BASIC METABOLIC PANEL WITH GFR
BUN: 16 mg/dL (ref 7–25)
CO2: 21 mmol/L (ref 20–32)
CREATININE: 0.64 mg/dL (ref 0.50–1.05)
Calcium: 9.4 mg/dL (ref 8.6–10.4)
Chloride: 104 mmol/L (ref 98–110)
GLUCOSE: 102 mg/dL — AB (ref 65–99)
Potassium: 5.2 mmol/L (ref 3.5–5.3)
SODIUM: 137 mmol/L (ref 135–146)

## 2017-07-19 LAB — LIPID PANEL
Cholesterol: 278 mg/dL — ABNORMAL HIGH (ref <200)
HDL: 78 mg/dL (ref >50)
LDL Cholesterol: 170 mg/dL — ABNORMAL HIGH (ref <100)
Total CHOL/HDL Ratio: 3.6 Ratio (ref <5.0)
Triglycerides: 151 mg/dL — ABNORMAL HIGH (ref <150)
VLDL: 30 mg/dL (ref <30)

## 2017-07-19 MED ORDER — FENOFIBRATE 145 MG PO TABS: 145.0000 mg | ORAL_TABLET | Freq: Every day | ORAL | 3 refills | Status: DC

## 2017-07-19 NOTE — Progress Notes (Signed)
Reviewed labs, cholesterol elevated. Will start a trial to Tricor 145 mg every evening. Will repeat fasting cholesterol panel in 3 months. Recommend a lowfat, low carbohydrate diet divided over 5-6 small meals, increase water intake to 6-8 glasses, and 150 minutes per week of cardiovascular exercise.     Meds ordered this encounter  Medications  . fenofibrate (TRICOR) 145 MG tablet    Sig: Take 1 tablet (145 mg total) by mouth daily.    Dispense:  90 tablet    Refill:  3     Nolon Nations  MSN, FNP-C Patient [facility] [address] [place], [place] [address] [phone]

## 2017-07-20 NOTE — Progress Notes (Signed)
Called, no answer. Left a message for patient to call back to discuss lab work. Thanks!

## 2017-07-22 ENCOUNTER — Encounter: Payer: Self-pay | Admitting: Family Medicine

## 2017-07-23 NOTE — Progress Notes (Signed)
Called, no answer. Left a message advising patient that cholesterol was elevated and that provider is adding Tricor 145mg  once every evening to medications. Recommended that patient eat a lowfat/low carb diet over 5 to 6 small meals daily, drink 6 to 8 glasses of water daily, and exercise 150 minutes weekly of cardio. Advised that medication was sent into pharmacy and if she had any questions to call back to our office. Thanks!

## 2017-08-21 ENCOUNTER — Other Ambulatory Visit: Payer: Self-pay | Admitting: Pharmacist

## 2017-08-21 ENCOUNTER — Encounter: Payer: Self-pay | Admitting: Family Medicine

## 2017-08-21 DIAGNOSIS — J4520 Mild intermittent asthma, uncomplicated: Secondary | ICD-10-CM

## 2017-08-21 MED ORDER — ALBUTEROL SULFATE HFA 108 (90 BASE) MCG/ACT IN AERS: INHALATION_SPRAY | RESPIRATORY_TRACT | 5 refills | Status: DC

## 2017-08-21 MED ORDER — FLUTICASONE PROPIONATE HFA 110 MCG/ACT IN AERO: INHALATION_SPRAY | RESPIRATORY_TRACT | 5 refills | Status: DC

## 2017-08-22 ENCOUNTER — Other Ambulatory Visit: Payer: Self-pay

## 2017-08-22 DIAGNOSIS — H1010 Acute atopic conjunctivitis, unspecified eye: Secondary | ICD-10-CM

## 2017-08-22 DIAGNOSIS — J4520 Mild intermittent asthma, uncomplicated: Secondary | ICD-10-CM

## 2017-08-22 MED ORDER — CROMOLYN SODIUM 4 % OP SOLN: OPHTHALMIC | 2 refills | Status: DC

## 2017-08-22 MED ORDER — CETIRIZINE HCL 10 MG PO TABS: 10.0000 mg | ORAL_TABLET | Freq: Every day | ORAL | 11 refills | Status: DC

## 2017-08-22 MED ORDER — TRIAMCINOLONE ACETONIDE 0.1 % EX CREA: TOPICAL_CREAM | CUTANEOUS | 3 refills | Status: DC

## 2017-08-22 NOTE — Telephone Encounter (Signed)
Refills sent into pharmacy. Thanks!  

## 2017-11-22 ENCOUNTER — Telehealth: Payer: Self-pay

## 2017-11-22 ENCOUNTER — Other Ambulatory Visit: Payer: Self-pay | Admitting: Family Medicine

## 2017-11-22 DIAGNOSIS — F411 Generalized anxiety disorder: Secondary | ICD-10-CM

## 2017-11-22 MED ORDER — LORAZEPAM 1 MG PO TABS: ORAL_TABLET | ORAL | 0 refills | Status: DC

## 2017-11-22 NOTE — Telephone Encounter (Signed)
[place], Please advise. Thanks!  

## 2017-11-22 NOTE — Progress Notes (Signed)
Reviewed [place] Substance Reporting system prior to prescribing opiate medications. No inconsistencies noted.  Meds ordered this encounter  Medications  . LORazepam (ATIVAN) 1 MG tablet    Sig: ONE TABLET EVERY 8 HOURS AS NEEDED    Dispense:  60 tablet    Refill:  0    Order Specific Question:   Supervising Provider    Answer:   Quentin AngstJEGEDE, OLUGBEMIGA E [[id number]]     Sheri NationsLachina Moore Xee Hollman  MSN, FNP-C Patient Care [facility] [address]  [place], [place] [id number] [phone]

## 2017-12-03 ENCOUNTER — Other Ambulatory Visit: Payer: Self-pay | Admitting: Family Medicine

## 2017-12-03 DIAGNOSIS — I10 Essential (primary) hypertension: Secondary | ICD-10-CM

## 2017-12-03 DIAGNOSIS — F411 Generalized anxiety disorder: Secondary | ICD-10-CM

## 2017-12-03 DIAGNOSIS — K219 Gastro-esophageal reflux disease without esophagitis: Secondary | ICD-10-CM

## 2017-12-03 NOTE — Telephone Encounter (Signed)
Armeniahina, Please advise if she can get a refill on ativan. Thanks!

## 2017-12-12 ENCOUNTER — Ambulatory Visit: Payer: Medicare Other

## 2017-12-12 ENCOUNTER — Other Ambulatory Visit: Payer: Medicare Other

## 2017-12-12 DIAGNOSIS — Z113 Encounter for screening for infections with a predominantly sexual mode of transmission: Secondary | ICD-10-CM

## 2017-12-12 DIAGNOSIS — Z79899 Other long term (current) drug therapy: Secondary | ICD-10-CM

## 2017-12-12 DIAGNOSIS — B20 Human immunodeficiency virus [HIV] disease: Secondary | ICD-10-CM | POA: Diagnosis not present

## 2017-12-13 LAB — T-HELPER CELL (CD4) - (RCID CLINIC ONLY)
CD4 T CELL ABS: 1200 /uL (ref 400–2700)
CD4 T CELL HELPER: 36 % (ref 33–55)

## 2017-12-14 LAB — COMPREHENSIVE METABOLIC PANEL
AG RATIO: 1.7 (calc) (ref 1.0–2.5)
ALT: 25 U/L (ref 6–29)
AST: 24 U/L (ref 10–35)
Albumin: 4.1 g/dL (ref 3.6–5.1)
Alkaline phosphatase (APISO): 83 U/L (ref 33–130)
BILIRUBIN TOTAL: 0.2 mg/dL (ref 0.2–1.2)
BUN: 12 mg/dL (ref 7–25)
CALCIUM: 9.1 mg/dL (ref 8.6–10.4)
CO2: 24 mmol/L (ref 20–32)
Chloride: 106 mmol/L (ref 98–110)
Creat: 0.65 mg/dL (ref 0.50–1.05)
Globulin: 2.4 g/dL (calc) (ref 1.9–3.7)
Glucose, Bld: 97 mg/dL (ref 65–99)
Potassium: 4.2 mmol/L (ref 3.5–5.3)
SODIUM: 140 mmol/L (ref 135–146)
TOTAL PROTEIN: 6.5 g/dL (ref 6.1–8.1)

## 2017-12-14 LAB — CBC
HEMATOCRIT: 38.3 % (ref 35.0–45.0)
Hemoglobin: 13 g/dL (ref 11.7–15.5)
MCH: 31.3 pg (ref 27.0–33.0)
MCHC: 33.9 g/dL (ref 32.0–36.0)
MCV: 92.3 fL (ref 80.0–100.0)
MPV: 10.4 fL (ref 7.5–12.5)
PLATELETS: 302 10*3/uL (ref 140–400)
RBC: 4.15 10*6/uL (ref 3.80–5.10)
RDW: 12.4 % (ref 11.0–15.0)
WBC: 6.3 10*3/uL (ref 3.8–10.8)

## 2017-12-14 LAB — LIPID PANEL
Cholesterol: 266 mg/dL — ABNORMAL HIGH (ref <200)
HDL: 80 mg/dL (ref >50)
LDL Cholesterol (Calc): 155 mg/dL (calc) — ABNORMAL HIGH
Non-HDL Cholesterol (Calc): 186 mg/dL (calc) — ABNORMAL HIGH (ref <130)
Total CHOL/HDL Ratio: 3.3 (calc) (ref <5.0)
Triglycerides: 177 mg/dL — ABNORMAL HIGH (ref <150)

## 2017-12-14 LAB — RPR: RPR Ser Ql: NONREACTIVE

## 2017-12-14 LAB — HIV-1 RNA QUANT-NO REFLEX-BLD
HIV 1 RNA Quant: <20 copies/mL
HIV-1 RNA Quant, Log: <1.3 Log copies/mL

## 2017-12-26 ENCOUNTER — Ambulatory Visit: Payer: Medicare HMO | Admitting: Infectious Diseases

## 2018-01-09 ENCOUNTER — Encounter: Payer: Self-pay | Admitting: Infectious Diseases

## 2018-01-09 ENCOUNTER — Telehealth: Payer: Self-pay

## 2018-01-09 ENCOUNTER — Ambulatory Visit (INDEPENDENT_AMBULATORY_CARE_PROVIDER_SITE_OTHER): Payer: Medicare Other | Admitting: Infectious Diseases

## 2018-01-09 ENCOUNTER — Other Ambulatory Visit: Payer: Self-pay

## 2018-01-09 VITALS — BP 148/99 | HR 65 | Temp 98.7°F | Wt 227.0 lb

## 2018-01-09 DIAGNOSIS — Z113 Encounter for screening for infections with a predominantly sexual mode of transmission: Secondary | ICD-10-CM

## 2018-01-09 DIAGNOSIS — F3289 Other specified depressive episodes: Secondary | ICD-10-CM

## 2018-01-09 DIAGNOSIS — R7303 Prediabetes: Secondary | ICD-10-CM

## 2018-01-09 DIAGNOSIS — R221 Localized swelling, mass and lump, neck: Secondary | ICD-10-CM

## 2018-01-09 DIAGNOSIS — M1712 Unilateral primary osteoarthritis, left knee: Secondary | ICD-10-CM | POA: Diagnosis not present

## 2018-01-09 DIAGNOSIS — I10 Essential (primary) hypertension: Secondary | ICD-10-CM

## 2018-01-09 DIAGNOSIS — B20 Human immunodeficiency virus [HIV] disease: Secondary | ICD-10-CM

## 2018-01-09 DIAGNOSIS — E782 Mixed hyperlipidemia: Secondary | ICD-10-CM | POA: Diagnosis not present

## 2018-01-09 MED ORDER — MELOXICAM 7.5 MG PO TABS: 7.5000 mg | ORAL_TABLET | Freq: Every day | ORAL | 1 refills | Status: DC

## 2018-01-09 NOTE — Assessment & Plan Note (Signed)
She will f/u with PCP Separate rx? Her HR is near goal.

## 2018-01-09 NOTE — Assessment & Plan Note (Addendum)
Will get her in with mental health.  Admits to having limited support system.

## 2018-01-09 NOTE — Assessment & Plan Note (Signed)
Spoke about changing her meds- if it's not broken don't fix it.  She was concerned about her liver and kidney functions.  I explained risk of kidney and bone dysfunction with tenofovir.  Offered/refused condoms.  She has gotten flu shot.  She will get PAP and mammo 1 yr from last.  rtc in 9 months.

## 2018-01-09 NOTE — Assessment & Plan Note (Signed)
Consider statin? Her Trig are better on diet change.

## 2018-01-09 NOTE — Assessment & Plan Note (Signed)
She will work with PCP on getting MRI.

## 2018-01-09 NOTE — Telephone Encounter (Signed)
Pt [facility] 7.[facility] [place] [place]. I was able to call [facility] pharmacy to cancel the previous order and send the prescription to the correct pharmacy. Pt was notified and stated she would stop by the pharmacy after work. Lorenso CourierJose L Maldonado, [place]

## 2018-01-09 NOTE — Assessment & Plan Note (Signed)
She complains of pain in her L knee. There is no warmth or effusion.  She asks for Mobic which we will give her trial of.

## 2018-01-09 NOTE — Assessment & Plan Note (Signed)
Has done better with diet control.

## 2018-01-09 NOTE — Progress Notes (Signed)
   Subjective:    Patient ID: Sheri Davis, female    DOB: 12/08/1960, 57 y.o.   MRN: [id number]  HPI  [age] F with hx of HIV+, HTN, hyperlipidemia, pre-diabetes (A1C 6.3% last year).  She occas takes ativan for panic attacks. Did not like prozac, cymbalta, seroquel, made her a "zombie". Does not have mental health counselor ([place]ives in [place], [place] fell between the cracks").  Has been missing her tricor, prevacid. Using natural rx for GI upset, also avoiding spicy foods.   She is still worried about the swelling over her R clavicular joint. She had an u/s that showed arthritis.   HIV 1 RNA Quant (copies/mL)  Date Value  12/12/2017 <20 NOT DETECTED  04/13/2017 <20 NOT DETECTED  10/16/2016 27 (H)   CD4 T Cell Abs (/uL)  Date Value  12/12/2017 1,200  04/13/2017 1,600  10/16/2016 1,740    Review of Systems  Constitutional: Negative for appetite change, chills, fever and unexpected weight change.  Gastrointestinal: Negative for constipation and diarrhea.  Genitourinary: Negative for difficulty urinating and menstrual problem.  Psychiatric/Behavioral: The patient is nervous/anxious.   hot flashes.  Please see HPI. All other systems reviewed and negative.   PAP- May 2018 nl Mammo- Aug 2018 nl.     Objective:   Physical Exam  Constitutional: She is oriented to person, place, and time. She appears well-developed and well-nourished.  HENT:  Mouth/Throat: No oropharyngeal exudate.  Eyes: Pupils are equal, round, and reactive to light.  Neck: Neck supple.  Cardiovascular: Normal rate, regular rhythm and normal heart sounds.  Pulmonary/Chest: Effort normal and breath sounds normal.  Abdominal: Soft. Bowel sounds are normal. There is no tenderness. There is no rebound.  Musculoskeletal: She exhibits no edema.  Lymphadenopathy:    She has no cervical adenopathy.  Neurological: She is alert and oriented to person, place, and time.  Psychiatric: She has a normal mood and  affect.      Assessment & Plan:

## 2018-01-16 ENCOUNTER — Encounter: Payer: Self-pay | Admitting: Infectious Diseases

## 2018-01-21 ENCOUNTER — Encounter: Payer: Self-pay | Admitting: Family Medicine

## 2018-01-21 ENCOUNTER — Other Ambulatory Visit: Payer: Self-pay | Admitting: Family Medicine

## 2018-01-21 ENCOUNTER — Ambulatory Visit (INDEPENDENT_AMBULATORY_CARE_PROVIDER_SITE_OTHER): Payer: Medicare Other | Admitting: Family Medicine

## 2018-01-21 ENCOUNTER — Other Ambulatory Visit: Payer: Self-pay | Admitting: Infectious Diseases

## 2018-01-21 VITALS — BP 118/78 | HR 60 | Temp 98.2°F | Ht 65.0 in | Wt 227.0 lb

## 2018-01-21 DIAGNOSIS — M1712 Unilateral primary osteoarthritis, left knee: Secondary | ICD-10-CM | POA: Diagnosis not present

## 2018-01-21 DIAGNOSIS — B20 Human immunodeficiency virus [HIV] disease: Secondary | ICD-10-CM

## 2018-01-21 DIAGNOSIS — I10 Essential (primary) hypertension: Secondary | ICD-10-CM | POA: Diagnosis not present

## 2018-01-21 DIAGNOSIS — F411 Generalized anxiety disorder: Secondary | ICD-10-CM

## 2018-01-21 DIAGNOSIS — J4520 Mild intermittent asthma, uncomplicated: Secondary | ICD-10-CM

## 2018-01-21 DIAGNOSIS — H1010 Acute atopic conjunctivitis, unspecified eye: Secondary | ICD-10-CM

## 2018-01-21 DIAGNOSIS — R7303 Prediabetes: Secondary | ICD-10-CM

## 2018-01-21 DIAGNOSIS — E7849 Other hyperlipidemia: Secondary | ICD-10-CM

## 2018-01-21 LAB — POCT URINALYSIS DIP (DEVICE)
Bilirubin Urine: NEGATIVE
GLUCOSE, UA: NEGATIVE mg/dL
Hgb urine dipstick: NEGATIVE
Ketones, ur: NEGATIVE mg/dL
LEUKOCYTES UA: NEGATIVE
NITRITE: NEGATIVE
PROTEIN: NEGATIVE mg/dL
Specific Gravity, Urine: 1.015 (ref 1.005–1.030)
UROBILINOGEN UA: 0.2 mg/dL (ref 0.0–1.0)
pH: 7 (ref 5.0–8.0)

## 2018-01-21 LAB — POCT GLYCOSYLATED HEMOGLOBIN (HGB A1C): HEMOGLOBIN A1C: 6.1

## 2018-01-21 MED ORDER — MELOXICAM 7.5 MG PO TABS: 7.5000 mg | ORAL_TABLET | Freq: Every day | ORAL | 1 refills | Status: DC

## 2018-01-21 MED ORDER — ALBUTEROL SULFATE HFA 108 (90 BASE) MCG/ACT IN AERS: INHALATION_SPRAY | RESPIRATORY_TRACT | 5 refills | Status: DC

## 2018-01-21 MED ORDER — LORAZEPAM 1 MG PO TABS: ORAL_TABLET | ORAL | 0 refills | Status: DC

## 2018-01-21 MED ORDER — FLUTICASONE PROPIONATE 50 MCG/ACT NA SUSP: NASAL | 5 refills | Status: DC

## 2018-01-21 MED ORDER — FENOFIBRATE 145 MG PO TABS: 145.0000 mg | ORAL_TABLET | Freq: Every day | ORAL | 3 refills | Status: DC

## 2018-01-21 MED ORDER — CETIRIZINE HCL 10 MG PO TABS: 10.0000 mg | ORAL_TABLET | Freq: Every day | ORAL | 11 refills | Status: DC

## 2018-01-21 MED ORDER — ATENOLOL 25 MG PO TABS: ORAL_TABLET | ORAL | 0 refills | Status: DC

## 2018-01-21 NOTE — Patient Instructions (Signed)
Your hemoglobin a1c has increased to 6.1, which is consistent with prediabetes. Recommend a lowfat, low carbohydrate diet divided over 5-6 small meals, increase water intake to 6-8 glasses, and 150 minutes per week of cardiovascular exercise.  Also, your cholesterol is elevated. Please take Tricor as directed. Increase outdoor activity level not that the weather is becoming warmer.   Please complete financial assistance forms. If approved for [facility] financial assistance, please notify office so that I can repeat ultrasound of neck and soft tissue.  All [facility].   We will follow up in 6 months   Prediabetes Eating Plan Prediabetes-also called impaired glucose tolerance or impaired fasting glucose-is a condition that causes blood sugar (blood glucose) levels to be higher than normal. Following a healthy diet can help to keep prediabetes under control. It can also help to lower the risk of type 2 diabetes and heart disease, which are increased in people who have prediabetes. Along with regular exercise, a healthy diet:  Promotes weight loss.  Helps to control blood sugar levels.  Helps to improve the way that the body uses insulin.  What do I need to know about this eating plan?  Use the glycemic index (GI) to plan your meals. The index tells you how quickly a food will raise your blood sugar. Choose low-GI foods. These foods take a longer time to raise blood sugar.  Pay close attention to the amount of carbohydrates in the food that you eat. Carbohydrates increase blood sugar levels.  Keep track of how many calories you take in. Eating the right amount of calories will help you to achieve a healthy weight. Losing about 7 percent of your starting weight can help to prevent type 2 diabetes.  You may want to follow a Mediterranean diet. This diet includes a lot of vegetables, lean meats or fish, whole grains, fruits, and healthy oils and  fats. What foods can I eat? Grains Whole grains, such as whole-wheat or whole-grain breads, crackers, cereals, and pasta. Unsweetened oatmeal. Bulgur. Barley. Quinoa. Brown rice. Corn or whole-wheat flour tortillas or taco shells. Vegetables Lettuce. Spinach. Peas. Beets. Cauliflower. Cabbage. Broccoli. Carrots. Tomatoes. Squash. Eggplant. Herbs. Peppers. Onions. Cucumbers. Brussels sprouts. Fruits Berries. Bananas. Apples. Oranges. Grapes. Papaya. Mango. Pomegranate. Kiwi. Grapefruit. Cherries. Meats and Other Protein Sources Seafood. Lean meats, such as chicken and [place] or lean cuts of pork and beef. Tofu. Eggs. Nuts. Beans. Dairy Low-fat or fat-free dairy products, such as yogurt, cottage cheese, and cheese. Beverages Water. Tea. Coffee. Sugar-free or diet soda. Seltzer water. Milk. Milk alternatives, such as soy or almond milk. Condiments Mustard. Relish. Low-fat, low-sugar ketchup. Low-fat, low-sugar barbecue sauce. Low-fat or fat-free mayonnaise. Sweets and Desserts Sugar-free or low-fat pudding. Sugar-free or low-fat ice cream and other frozen treats. Fats and Oils Avocado. Walnuts. Olive oil. The items listed above may not be a complete list of recommended foods or beverages. Contact your dietitian for more options. What foods are not recommended? Grains Refined white flour and flour products, such as bread, pasta, snack foods, and cereals. Beverages Sweetened drinks, such as sweet iced tea and soda. Sweets and Desserts Baked goods, such as cake, cupcakes, pastries, cookies, and cheesecake. The items listed above may not be a complete list of foods and beverages to avoid. Contact your dietitian for more information. This information is not intended to replace advice given to you by your health care provider. Make sure you discuss any questions you have with your  health care provider. Document Released: 03/30/2015 Document Revised: 04/20/2016 Document Reviewed:  12/09/2014 Elsevier Interactive Patient Education  2017 [employer].

## 2018-01-21 NOTE — Progress Notes (Signed)
Subjective:    Patient ID: Sheri Davis, female    DOB: November 20, 1961, 57 y.o.   MRN: [id number]  HPI Sheri Davis is a 57 year old female with a history of HIV, anxiety,  hypertension, and prediabetes presents for a 6 month follow up of chronic conditions. She says that she feels well and is without complaint.  Sheri Davis is here for follow up of hypertension and hyperlipidemia.  She is not exercising routinely and is not adherent to low salt diet.  Patient denies dizziness,  chest pain, dyspnea, fatigue, lower extremity edema, orthopnea, palpitations, syncope and tachypnea.  Cardiovascular risk factors: dyslipidemia, obesity (BMI >= 30 kg/m2) and sedentary lifestyle. Patient is also following up on prediabetes. Last hemoglobin A1C was 5.9%. Patient denies foot ulcerations, increase appetite, nausea, paresthesia of the feet, polydipsia, polyuria, visual disturbances and weight loss.    Patient is also complaining of periodic anxiety.  She has the following symptoms: difficulty concentrating, irritable, racing thoughts. She reports that she has post traumatic stress syndrome. She also reports a history of becoming anxious in when approaching new situations.  She denies current suicidal and homicidal ideation.  Previous treatment includes Ativan, BuSpar and Prozac.  She states that she has not been able to follow up with [facility] as discussed during previous appointment. She typically takes Ativan with acute panic attacks and anxiety exacerbation with maximum relief.   Past Medical History:  Diagnosis Date  . Allergy   . Asthma   . GERD (gastroesophageal reflux disease)   . HIV infection (HCC)   . Hyperlipidemia   . Tachycardia y-12   Pt was under stress at the time   Social History   Socioeconomic History  . Marital status: Single    Spouse name: Not on file  . Number of children: Not on file  . Years of education: Not on file  . Highest education  level: Not on file  Social Needs  . Financial resource strain: Not on file  . Food insecurity - worry: Not on file  . Food insecurity - inability: Not on file  . Transportation needs - medical: Not on file  . Transportation needs - non-medical: Not on file  Occupational History  . Not on file  Tobacco Use  . Smoking status: Never Smoker  . Smokeless tobacco: Never Used  Substance and Sexual Activity  . Alcohol use: Yes    Comment: occasional  . Drug use: No  . Sexual activity: No    Partners: Male    Comment: given condoms  Other Topics Concern  . Not on file  Social History Narrative  . Not on file   Immunization History  Administered Date(s) Administered  . Influenza Split 09/08/2011, 08/14/2012  . Influenza Whole 08/28/2007, 09/09/2007, 09/14/2008, 09/24/2009, 09/12/2010  . Influenza,inj,Quad PF,6+ Mos 08/26/2014, 08/18/2015, 09/13/2016, 07/18/2017  . Influenza-Unspecified 08/25/2013  . PPD Test 01/29/2015, 02/04/2016, 07/01/2016  . Pneumococcal Conjugate-13 04/29/2015  . Pneumococcal Polysaccharide-23 08/28/2007, 01/29/2012  . Tdap 08/26/2014   Review of Systems  Constitutional: Positive for unexpected weight change (weight gain). Negative for fatigue and fever.  HENT: Positive for postnasal drip.   Respiratory: Negative.   Cardiovascular: Negative.   Gastrointestinal: Negative.   Endocrine: Negative.  Negative for polydipsia, polyphagia and polyuria.  Genitourinary: Negative.   Musculoskeletal: Negative.   Skin: Negative.   Allergic/Immunologic: Positive for environmental allergies and immunocompromised state (HIV positive).  Neurological: Negative.  Negative for dizziness and light-headedness.  Hematological: Negative.  Psychiatric/Behavioral: The patient is nervous/anxious.        Objective:   Physical Exam  Constitutional: She is oriented to person, place, and time. She appears well-developed and well-nourished.  HENT:  Head: Normocephalic and  atraumatic.  Right Ear: Hearing, tympanic membrane, external ear and ear canal normal.  Left Ear: Hearing, tympanic membrane, external ear and ear canal normal.  Mouth/Throat: Uvula is midline.  Eyes: Conjunctivae, EOM and lids are normal. Pupils are equal, round, and reactive to light. Lids are everted and swept, no foreign bodies found.  Neck: Trachea normal and normal range of motion. Neck supple. No neck rigidity. Normal range of motion present. No thyroid mass and no thyromegaly present.  Cardiovascular: Normal rate, regular rhythm, normal heart sounds and intact distal pulses.  Pulmonary/Chest: Effort normal and breath sounds normal.  Abdominal: Soft. Bowel sounds are normal. She exhibits no distension (obesity). There is no tenderness.  Musculoskeletal: Normal range of motion.  Neurological: She is alert and oriented to person, place, and time. She has normal reflexes.  Skin: Skin is warm and dry.  Psychiatric: Her behavior is normal. Judgment and thought content normal. Her mood appears not anxious. Her speech is not rapid and/or pressured. She expresses no homicidal and no suicidal ideation. She expresses no suicidal plans.      BP 118/78 (BP Location: Left Arm, Patient Position: Sitting, Cuff Size: Large)   Pulse 60   Temp 98.2 F (36.8 C) (Oral)   Ht 5\' 5"  (1.651 m)   Wt 227 lb (103 kg)   SpO2 99%   BMI 37.77 kg/m  Assessment & Plan:   Human immunodeficiency virus (HIV) disease (HCC) Patient consistently follows with infectious disease.   Prediabetes Hemoglobin a1C is 6.1.  Recommend a lowfat, low carbohydrate diet divided over 5-6 small meals, increase water intake to 6-8 glasses, and 150 minutes per week of cardiovascular exercise.   - HgB A1c  Essential hypertension Blood pressure is at goal on current medication regimen No changes warranted on today Will review renal functioning as results become available - POCT urinalysis dip (device) - atenolol (TENORMIN) 25  MG tablet; TAKE 1 TABLET(25 MG) BY MOUTH DAILY  Dispense: 90 tablet; Refill: 0 Mild intermittent asthma without complication - albuterol (VENTOLIN HFA) 108 (90 Base) MCG/ACT inhaler; INHALE 1 PUFF INTO THE LUNGS EVERY 4 HOURS AS NEEDED FOR SHORTNESS OF BREATH  Dispense: 18 g; Refill: 5 - cetirizine (ZYRTEC) 10 MG tablet; Take 1 tablet (10 mg total) by mouth daily.  Dispense: 30 tablet; Refill: 11 - fluticasone (FLONASE) 50 MCG/ACT nasal spray; INSTILL 1 SPRAY IN EACH NOSTRIL ONCE A DAY  Dispense: 16 g; Refill: 5   Other hyperlipidemia The 10-year ASCVD risk score Denman George DC Jr., et al., 2013) is: 7.9%   Values used to calculate the score:     Age: 36 years     Sex: Female     Is Non-Hispanic African American: Yes     Diabetic: Yes     Tobacco smoker: No     Systolic Blood Pressure: 118 mmHg     Is BP treated: Yes     HDL Cholesterol: 80 mg/dL     Total Cholesterol: 266 mg/dL  - fenofibrate (TRICOR) 145 MG tablet; Take 1 tablet (145 mg total) by mouth daily.  Dispense: 90 tablet; Refill: 3   Arthritis of left knee - meloxicam (MOBIC) 7.5 MG tablet; Take 1 tablet (7.5 mg total) by mouth daily.  Dispense: 20 tablet; Refill: 1  Anxiety state - LORazepam (ATIVAN) 1 MG tablet; ONE TABLET EVERY 8 HOURS AS NEEDED  Dispense: 60 tablet; Refill: 0   RTC: Follow up in 6 months   Nolon NationsLachina Moore Azreal Stthomas  MSN, FNP-C Patient Care [facility] [address]  [place], [place] [id number] [phone]

## 2018-02-04 ENCOUNTER — Other Ambulatory Visit: Payer: Self-pay | Admitting: Infectious Diseases

## 2018-02-04 DIAGNOSIS — M1712 Unilateral primary osteoarthritis, left knee: Secondary | ICD-10-CM

## 2018-02-18 ENCOUNTER — Other Ambulatory Visit: Payer: Self-pay | Admitting: Family Medicine

## 2018-02-18 DIAGNOSIS — H1010 Acute atopic conjunctivitis, unspecified eye: Secondary | ICD-10-CM

## 2018-03-01 ENCOUNTER — Ambulatory Visit: Payer: Medicare Other | Admitting: Infectious Diseases

## 2018-03-15 ENCOUNTER — Other Ambulatory Visit: Payer: Self-pay | Admitting: Family Medicine

## 2018-03-15 DIAGNOSIS — H1010 Acute atopic conjunctivitis, unspecified eye: Secondary | ICD-10-CM

## 2018-03-15 DIAGNOSIS — I10 Essential (primary) hypertension: Secondary | ICD-10-CM

## 2018-04-10 ENCOUNTER — Telehealth: Payer: Self-pay

## 2018-04-10 DIAGNOSIS — M1712 Unilateral primary osteoarthritis, left knee: Secondary | ICD-10-CM

## 2018-04-10 MED ORDER — MELOXICAM 7.5 MG PO TABS: 7.5000 mg | ORAL_TABLET | Freq: Every day | ORAL | 1 refills | Status: DC

## 2018-04-10 NOTE — Telephone Encounter (Signed)
Refill for meloxicam sent into pharmacy. Thanks!

## 2018-04-15 ENCOUNTER — Other Ambulatory Visit: Payer: Self-pay | Admitting: Family Medicine

## 2018-04-15 ENCOUNTER — Other Ambulatory Visit: Payer: Self-pay

## 2018-04-15 ENCOUNTER — Telehealth: Payer: Self-pay

## 2018-04-15 ENCOUNTER — Other Ambulatory Visit: Payer: Self-pay | Admitting: Infectious Diseases

## 2018-04-15 DIAGNOSIS — H1010 Acute atopic conjunctivitis, unspecified eye: Secondary | ICD-10-CM

## 2018-04-15 DIAGNOSIS — M1712 Unilateral primary osteoarthritis, left knee: Secondary | ICD-10-CM

## 2018-04-15 MED ORDER — MELOXICAM 7.5 MG PO TABS
7.5000 mg | ORAL_TABLET | Freq: Every day | ORAL | 1 refills | Status: DC
Start: 2018-04-15 — End: 2018-05-25

## 2018-04-15 NOTE — Telephone Encounter (Signed)
Pt called today requesting a refill on Mobic for her arthritis refill was sent to [facility] in [place]. Lorenso Courier, CMA

## 2018-04-24 ENCOUNTER — Other Ambulatory Visit: Payer: Self-pay | Admitting: Family Medicine

## 2018-04-24 DIAGNOSIS — H1010 Acute atopic conjunctivitis, unspecified eye: Secondary | ICD-10-CM

## 2018-04-25 ENCOUNTER — Other Ambulatory Visit: Payer: Self-pay | Admitting: Family Medicine

## 2018-04-25 DIAGNOSIS — J3089 Other allergic rhinitis: Secondary | ICD-10-CM

## 2018-04-25 MED ORDER — OLOPATADINE HCL 0.2 % OP SOLN: 1.0000 [drp] | Freq: Every day | OPHTHALMIC | 5 refills | Status: DC | PRN

## 2018-04-25 NOTE — Progress Notes (Signed)
Meds ordered this encounter  Medications  . Olopatadine HCl 0.2 % SOLN    Sig: Apply 1 drop to eye daily as needed.    Dispense:  1 Bottle    Refill:  5     Nolon Nations  MSN, FNP-C Patient [facility] [address] [place], [place] [address] [phone]

## 2018-04-25 NOTE — Telephone Encounter (Signed)
[place],  Can you prescribe an alternative to this? Please advise. Thanks!

## 2018-04-29 ENCOUNTER — Ambulatory Visit: Payer: Medicare Other | Admitting: Infectious Diseases

## 2018-05-13 ENCOUNTER — Other Ambulatory Visit: Payer: Self-pay | Admitting: Family Medicine

## 2018-05-13 DIAGNOSIS — I10 Essential (primary) hypertension: Secondary | ICD-10-CM

## 2018-05-13 DIAGNOSIS — H1010 Acute atopic conjunctivitis, unspecified eye: Secondary | ICD-10-CM

## 2018-05-13 DIAGNOSIS — J4520 Mild intermittent asthma, uncomplicated: Secondary | ICD-10-CM

## 2018-05-20 ENCOUNTER — Other Ambulatory Visit: Payer: Self-pay | Admitting: Family Medicine

## 2018-05-20 ENCOUNTER — Telehealth: Payer: Self-pay

## 2018-05-20 DIAGNOSIS — F411 Generalized anxiety disorder: Secondary | ICD-10-CM

## 2018-05-20 MED ORDER — LORAZEPAM 1 MG PO TABS: ORAL_TABLET | ORAL | 0 refills | Status: DC

## 2018-05-20 NOTE — Progress Notes (Signed)
Reviewed [place] Substance Reporting system prior to prescribing opiate medications. No inconsistencies noted.  Meds ordered this encounter  Medications  . LORazepam (ATIVAN) 1 MG tablet    Sig: ONE TABLET EVERY 8 HOURS AS NEEDED    Dispense:  60 tablet    Refill:  0    Order Specific Question:   Supervising Provider    Answer:   JEGEDE, OLUGBEMIGA E [[id number]]     Sheri Davis Sheri Mungin  MSN, FNP-C Patient Care [facility] [address]  [place],  [address] [phone]  

## 2018-05-20 NOTE — Telephone Encounter (Signed)
[place], Please advise. Thanks!  

## 2018-05-25 ENCOUNTER — Other Ambulatory Visit: Payer: Self-pay | Admitting: Infectious Diseases

## 2018-05-25 DIAGNOSIS — M1712 Unilateral primary osteoarthritis, left knee: Secondary | ICD-10-CM

## 2018-05-27 ENCOUNTER — Telehealth: Payer: Self-pay | Admitting: *Deleted

## 2018-05-27 NOTE — Telephone Encounter (Signed)
mobic for 90 days ok thanks

## 2018-05-27 NOTE — Telephone Encounter (Signed)
Patient is requesting a 90 day supply of mobic. This was written as a 20 pill trial. Please advise what a 90 day supply would be and if it is appropriate to fill. Andree CossHowell, Shaka Zech M, RN

## 2018-05-28 ENCOUNTER — Other Ambulatory Visit: Payer: Self-pay | Admitting: Infectious Diseases

## 2018-05-28 DIAGNOSIS — Z1231 Encounter for screening mammogram for malignant neoplasm of breast: Secondary | ICD-10-CM

## 2018-05-28 NOTE — Telephone Encounter (Signed)
Sent in   Thanks

## 2018-05-31 ENCOUNTER — Ambulatory Visit: Payer: Medicare Other | Admitting: Infectious Diseases

## 2018-06-11 ENCOUNTER — Other Ambulatory Visit: Payer: Self-pay | Admitting: Family Medicine

## 2018-06-11 DIAGNOSIS — J4520 Mild intermittent asthma, uncomplicated: Secondary | ICD-10-CM

## 2018-06-11 DIAGNOSIS — E7849 Other hyperlipidemia: Secondary | ICD-10-CM

## 2018-06-14 ENCOUNTER — Other Ambulatory Visit: Payer: Self-pay

## 2018-06-14 MED ORDER — TRIAMCINOLONE ACETONIDE 0.1 % EX CREA: TOPICAL_CREAM | CUTANEOUS | 3 refills | Status: DC

## 2018-06-20 ENCOUNTER — Other Ambulatory Visit: Payer: Self-pay | Admitting: Infectious Diseases

## 2018-06-20 DIAGNOSIS — M1712 Unilateral primary osteoarthritis, left knee: Secondary | ICD-10-CM

## 2018-06-21 ENCOUNTER — Telehealth: Payer: Self-pay

## 2018-06-21 NOTE — Telephone Encounter (Signed)
PT called today for a refill on medication did not stated which medication she wanted refilled or to what pharmacy. Called pt back and left a vm for a return call for more information Lorenso CourierJose L Addysen Louth, CMA

## 2018-06-25 ENCOUNTER — Other Ambulatory Visit: Payer: Self-pay | Admitting: Infectious Diseases

## 2018-06-25 ENCOUNTER — Telehealth: Payer: Self-pay | Admitting: *Deleted

## 2018-06-25 DIAGNOSIS — M1712 Unilateral primary osteoarthritis, left knee: Secondary | ICD-10-CM

## 2018-06-25 NOTE — Telephone Encounter (Signed)
Patient called to request refills of meloxicam. A prescription was sent to her preferred pharmacy [facility], [place]) on 7/1, electronic receipt received. Per patient, the pharmacy does not have this. RN called pharmacist at patient's request, spoke with Alvino ChapelEllen. Pharmacy has been having intermittent issues with electronic prescriptions, will take verbal and date it for 7/1. She confirmed that insurance will cover 90 day supply. RN notified patient. Andree CossHowell, Runell Kovich M, RN

## 2018-06-28 ENCOUNTER — Ambulatory Visit: Payer: Medicare Other | Admitting: Infectious Diseases

## 2018-07-08 ENCOUNTER — Other Ambulatory Visit: Payer: Self-pay | Admitting: Family Medicine

## 2018-07-08 DIAGNOSIS — H1010 Acute atopic conjunctivitis, unspecified eye: Secondary | ICD-10-CM

## 2018-07-09 ENCOUNTER — Other Ambulatory Visit: Payer: Self-pay

## 2018-07-09 DIAGNOSIS — J4520 Mild intermittent asthma, uncomplicated: Secondary | ICD-10-CM

## 2018-07-09 MED ORDER — ALBUTEROL SULFATE HFA 108 (90 BASE) MCG/ACT IN AERS: INHALATION_SPRAY | RESPIRATORY_TRACT | 5 refills | Status: DC

## 2018-07-22 ENCOUNTER — Ambulatory Visit: Payer: Medicare Other | Admitting: Family Medicine

## 2018-07-23 ENCOUNTER — Ambulatory Visit
Admission: RE | Admit: 2018-07-23 | Discharge: 2018-07-23 | Disposition: A | Discharge status: Home / Self Care | Payer: Medicare Other | Source: Ambulatory Visit | Attending: Infectious Diseases | Admitting: Infectious Diseases

## 2018-07-23 DIAGNOSIS — Z1231 Encounter for screening mammogram for malignant neoplasm of breast: Secondary | ICD-10-CM | POA: Insufficient documentation

## 2018-08-05 ENCOUNTER — Other Ambulatory Visit: Payer: Self-pay

## 2018-08-05 ENCOUNTER — Other Ambulatory Visit: Payer: Self-pay | Admitting: Family Medicine

## 2018-08-05 ENCOUNTER — Other Ambulatory Visit: Payer: Self-pay | Admitting: Infectious Diseases

## 2018-08-05 DIAGNOSIS — B20 Human immunodeficiency virus [HIV] disease: Secondary | ICD-10-CM

## 2018-08-05 DIAGNOSIS — J4520 Mild intermittent asthma, uncomplicated: Secondary | ICD-10-CM

## 2018-08-05 DIAGNOSIS — I10 Essential (primary) hypertension: Secondary | ICD-10-CM

## 2018-08-05 DIAGNOSIS — H1010 Acute atopic conjunctivitis, unspecified eye: Secondary | ICD-10-CM

## 2018-08-05 MED ORDER — CROMOLYN SODIUM 4 % OP SOLN: OPHTHALMIC | 2 refills | Status: DC

## 2018-08-06 ENCOUNTER — Other Ambulatory Visit: Payer: Self-pay | Admitting: Family Medicine

## 2018-08-06 DIAGNOSIS — J4520 Mild intermittent asthma, uncomplicated: Secondary | ICD-10-CM

## 2018-08-15 ENCOUNTER — Encounter: Payer: Self-pay | Admitting: Infectious Diseases

## 2018-08-15 ENCOUNTER — Encounter: Payer: Self-pay | Admitting: *Deleted

## 2018-08-15 ENCOUNTER — Other Ambulatory Visit (HOSPITAL_COMMUNITY)
Admission: RE | Admit: 2018-08-15 | Discharge: 2018-08-15 | Disposition: A | Discharge status: Home / Self Care | Payer: Medicare Other | Source: Ambulatory Visit | Attending: Infectious Diseases | Admitting: Infectious Diseases

## 2018-08-15 ENCOUNTER — Ambulatory Visit (INDEPENDENT_AMBULATORY_CARE_PROVIDER_SITE_OTHER): Payer: Medicare Other | Admitting: Infectious Diseases

## 2018-08-15 VITALS — BP 119/84 | HR 60 | Temp 98.0°F | Wt 232.0 lb

## 2018-08-15 DIAGNOSIS — E782 Mixed hyperlipidemia: Secondary | ICD-10-CM | POA: Diagnosis not present

## 2018-08-15 DIAGNOSIS — K219 Gastro-esophageal reflux disease without esophagitis: Secondary | ICD-10-CM | POA: Insufficient documentation

## 2018-08-15 DIAGNOSIS — Z113 Encounter for screening for infections with a predominantly sexual mode of transmission: Secondary | ICD-10-CM | POA: Diagnosis not present

## 2018-08-15 DIAGNOSIS — Z124 Encounter for screening for malignant neoplasm of cervix: Secondary | ICD-10-CM | POA: Diagnosis not present

## 2018-08-15 DIAGNOSIS — E049 Nontoxic goiter, unspecified: Secondary | ICD-10-CM | POA: Insufficient documentation

## 2018-08-15 DIAGNOSIS — J45909 Unspecified asthma, uncomplicated: Secondary | ICD-10-CM | POA: Insufficient documentation

## 2018-08-15 DIAGNOSIS — Z01419 Encounter for gynecological examination (general) (routine) without abnormal findings: Secondary | ICD-10-CM

## 2018-08-15 DIAGNOSIS — Z23 Encounter for immunization: Secondary | ICD-10-CM | POA: Diagnosis not present

## 2018-08-15 DIAGNOSIS — N393 Stress incontinence (female) (male): Secondary | ICD-10-CM | POA: Diagnosis not present

## 2018-08-15 DIAGNOSIS — B20 Human immunodeficiency virus [HIV] disease: Secondary | ICD-10-CM

## 2018-08-15 NOTE — Progress Notes (Signed)
      Subjective:    Neena Cherrie DistanceR Dolle is a 57 y.o. female here for an annual pelvic exam and pap smear.   Review of Systems: Current GYN complaints or concerns: stress incontinence and cyst on her inner thigh. No further menses (stopped 10y ago). Patient denies any abdominal/pelvic pain, problems with bowel movements, urination, vaginal discharge or intercourse.   Past Medical History:  Diagnosis Date  . Allergy   . Asthma   . GERD (gastroesophageal reflux disease)   . HIV infection (HCC)   . Hyperlipidemia   . Tachycardia y-12   Pt was under stress at the time    Gynecologic History: No obstetric history on file.  No LMP recorded. Patient is postmenopausal. Contraception: abstinence and post menopausal status Last Pap: 03/2017. Results were: normal Anal Intercourse: none Last Mammogram: 07/23/2018. Results were: normal  Objective:  Physical Exam  Constitutional: Well developed, well nourished, no acute distress. She is alert and oriented x3.  Neck: supple; palpable thyroid R>L lobe Pelvic: External genitalia is normal in appearance; 2.5 cm soft tissue swelling on the left inner thigh, nonpainful. No ulceration or cellulitis.. The vagina is normal in appearance. The cervix is bulbous and easily visualized. No CMT, normal expected cervical mucus present. Bimanual exam reveals uterus that is felt to be normal size, shape, and contour. Cystocele +. No adnexal masses or tenderness noted. Breasts: symmetrical in contour, shape and texture. No palpable masses/nodules. No nipple discharge.  Psych: She has a normal mood and affect.    Assessment:  Normal pelvic and bimanual exam. 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 Benign appearing cystThin prep pap was obtained and sent for cytology with reflex HPV and GC/C today. Normal clinical breast exam.   Plan:  Health Maintenance =   Return in 1 year for annual pap screening unless indicated sooner.   She has been counseled and instructed  how to perform monthly self breast exams.  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 No difficulty swallowing. Has not been evaluated   Check TSH panel  Thyroid ultrasound   HIV =   She will continue her Atripla and F/U as scheduled with Dr. Ninetta LightsHatcher for ongoing HIV care.   Rexene AlbertsStephanie Anetha Slagel, MSN, NP-C [facility] Office: [phone] Pager: [phone]  08/15/18 8:46 AM

## 2018-08-15 NOTE — Addendum Note (Signed)
Addended by: Mariea ClontsGREEN, Findlay Dagher D on: 08/15/2018 12:23 PM   Modules accepted: Orders

## 2018-08-15 NOTE — Patient Instructions (Addendum)
Will arrange an ultrasound of your thyroid at [facility]   Will send your pap smear result via MyChart   Kegel Exercises Kegel exercises help strengthen the muscles that support the rectum, vagina, small intestine, bladder, and uterus. Doing Kegel exercises can help:  Improve bladder and bowel control.  Improve sexual response.  Reduce problems and discomfort during pregnancy.  Kegel exercises involve squeezing your pelvic floor muscles, which are the same muscles you squeeze when you try to stop the flow of urine. The exercises can be done while sitting, standing, or lying down, but it is best to vary your position. Phase 1 exercises 1. Squeeze your pelvic floor muscles tight. You should feel a tight lift in your rectal area. If you are a female, you should also feel a tightness in your vaginal area. Keep your stomach, buttocks, and legs relaxed. 2. Hold the muscles tight for up to 10 seconds. 3. Relax your muscles. Repeat this exercise 50 times a day or as many times as told by your health care provider. Continue to do this exercise for at least 4-6 weeks or for as long as told by your health care provider. This information is not intended to replace advice given to you by your health care provider. Make sure you discuss any questions you have with your health care provider. Document Released: 10/30/2012 Document Revised: 07/08/2016 Document Reviewed: 10/03/2015 Elsevier Interactive Patient Education  2018 [employer]  Urinary Incontinence Urinary incontinence is the involuntary loss of urine from your bladder. What are the causes? There are many causes of urinary incontinence. They include:  Medicines.  Infections.  Prostatic enlargement, leading to overflow of urine from your bladder.  Surgery.  Neurological diseases.  Emotional factors.  What are the signs or symptoms? Urinary Incontinence can be divided into four types: 4. Urge incontinence. Urge incontinence  is the involuntary loss of urine before you have the opportunity to go to the bathroom. There is a sudden urge to void but not enough time to reach a bathroom. 5. Stress incontinence. Stress incontinence is the sudden loss of urine with any activity that forces urine to pass. It is commonly caused by anatomical changes to the pelvis and sphincter areas of your body. 6. Overflow incontinence. Overflow incontinence is the loss of urine from an obstructed opening to your bladder. This results in a backup of urine and a resultant buildup of pressure within the bladder. When the pressure within the bladder exceeds the closing pressure of the sphincter, the urine overflows, which causes incontinence, similar to water overflowing a dam. 7. Total incontinence. Total incontinence is the loss of urine as a result of the inability to store urine within your bladder.  How is this diagnosed? Evaluating the cause of incontinence may require:  A thorough and complete medical and obstetric history.  A complete physical exam.  Laboratory tests such as a urine culture and sensitivities.  When additional tests are indicated, they can include:  An ultrasound exam.  Kidney and bladder X-rays.  Cystoscopy. This is an exam of the bladder using a narrow scope.  Urodynamic testing to test the nerve function to the bladder and sphincter areas.  How is this treated? Treatment for urinary incontinence depends on the cause:  For urge incontinence caused by a bacterial infection, antibiotics will be prescribed. If the urge incontinence is related to medicines you take, your health care provider may have you change the medicine.  For stress incontinence, surgery to re-establish anatomical support  to the bladder or sphincter, or both, will often correct the condition.  For overflow incontinence caused by an enlarged prostate, an operation to open the channel through the enlarged prostate will allow the flow of urine  out of the bladder. In women with fibroids, a hysterectomy may be recommended.  For total incontinence, surgery on your urinary sphincter may help. An artificial urinary sphincter (an inflatable cuff placed around the urethra) may be required. In women who have developed a hole-like passage between their bladder and vagina (vesicovaginal fistula), surgery to close the fistula often is required.  Follow these instructions at home:  Normal daily hygiene and the use of pads or adult diapers that are changed regularly will help prevent odors and skin damage.  Avoid caffeine. It can overstimulate your bladder.  Use the bathroom regularly. Try about every 2-3 hours to go to the bathroom, even if you do not feel the need to do so. Take time to empty your bladder completely. After urinating, wait a minute. Then try to urinate again.  For causes involving nerve dysfunction, keep a log of the medicines you take and a journal of the times you go to the bathroom. Contact a health care provider if:  You experience worsening of pain instead of improvement in pain after your procedure.  Your incontinence becomes worse instead of better. Get help right away if:  You experience fever or shaking chills.  You are unable to pass your urine.  You have redness spreading into your groin or down into your thighs. This information is not intended to replace advice given to you by your health care provider. Make sure you discuss any questions you have with your health care provider. Document Released: 12/21/2004 Document Revised: 06/23/2016 Document Reviewed: 04/22/2013 Elsevier Interactive Patient Education  [employer].

## 2018-08-16 LAB — URINE CYTOLOGY ANCILLARY ONLY
Chlamydia: NEGATIVE
NEISSERIA GONORRHEA: NEGATIVE

## 2018-08-16 LAB — T-HELPER CELL (CD4) - (RCID CLINIC ONLY)
CD4 T CELL ABS: 1340 /uL (ref 400–2700)
CD4 T CELL HELPER: 38 % (ref 33–55)

## 2018-08-19 LAB — CYTOLOGY - PAP
Adequacy: ABSENT
Chlamydia: NEGATIVE
DIAGNOSIS: NEGATIVE
HPV (WINDOPATH): NOT DETECTED
Neisseria Gonorrhea: NEGATIVE

## 2018-08-19 NOTE — Progress Notes (Signed)
Normal pap cytology. Repeat in 1 year. MyChart sent.

## 2018-08-19 NOTE — Progress Notes (Signed)
Normal thyroid function. Awaiting u/s to evaluate enlarged gland.

## 2018-08-20 LAB — COMPREHENSIVE METABOLIC PANEL
AG Ratio: 1.8 (calc) (ref 1.0–2.5)
ALT: 25 U/L (ref 6–29)
AST: 24 U/L (ref 10–35)
Albumin: 4.3 g/dL (ref 3.6–5.1)
Alkaline phosphatase (APISO): 69 U/L (ref 33–130)
BUN: 20 mg/dL (ref 7–25)
CHLORIDE: 106 mmol/L (ref 98–110)
CO2: 25 mmol/L (ref 20–32)
Calcium: 9.1 mg/dL (ref 8.6–10.4)
Creat: 0.65 mg/dL (ref 0.50–1.05)
GLOBULIN: 2.4 g/dL (ref 1.9–3.7)
Glucose, Bld: 92 mg/dL (ref 65–99)
Potassium: 4.2 mmol/L (ref 3.5–5.3)
Sodium: 140 mmol/L (ref 135–146)
Total Bilirubin: 0.2 mg/dL (ref 0.2–1.2)
Total Protein: 6.7 g/dL (ref 6.1–8.1)

## 2018-08-20 LAB — CBC
HCT: 36.6 % (ref 35.0–45.0)
HEMOGLOBIN: 12.5 g/dL (ref 11.7–15.5)
MCH: 31.2 pg (ref 27.0–33.0)
MCHC: 34.2 g/dL (ref 32.0–36.0)
MCV: 91.3 fL (ref 80.0–100.0)
MPV: 10.5 fL (ref 7.5–12.5)
Platelets: 317 10*3/uL (ref 140–400)
RBC: 4.01 10*6/uL (ref 3.80–5.10)
RDW: 12 % (ref 11.0–15.0)
WBC: 6.9 10*3/uL (ref 3.8–10.8)

## 2018-08-20 LAB — HIV-1 RNA QUANT-NO REFLEX-BLD
HIV 1 RNA Quant: <20 copies/mL
HIV-1 RNA QUANT, LOG: NOT DETECTED {Log_copies}/mL

## 2018-08-20 LAB — THYROID PANEL WITH TSH
FREE THYROXINE INDEX: 1.8 (ref 1.4–3.8)
T3 Uptake: 27 % (ref 22–35)
T4 TOTAL: 6.6 ug/dL (ref 5.1–11.9)
TSH: 0.94 mIU/L (ref 0.40–4.50)

## 2018-08-20 LAB — LIPID PANEL
Cholesterol: 263 mg/dL — ABNORMAL HIGH (ref <200)
HDL: 73 mg/dL (ref >50)
LDL Cholesterol (Calc): 169 mg/dL (calc) — ABNORMAL HIGH
Non-HDL Cholesterol (Calc): 190 mg/dL (calc) — ABNORMAL HIGH (ref <130)
Total CHOL/HDL Ratio: 3.6 (calc) (ref <5.0)
Triglycerides: 93 mg/dL (ref <150)

## 2018-08-20 LAB — RPR: RPR Ser Ql: NONREACTIVE

## 2018-08-26 ENCOUNTER — Ambulatory Visit: Payer: Medicare Other | Admitting: Family Medicine

## 2018-08-27 ENCOUNTER — Ambulatory Visit
Admission: RE | Admit: 2018-08-27 | Discharge: 2018-08-27 | Disposition: A | Discharge status: Home / Self Care | Payer: Medicare Other | Source: Ambulatory Visit | Attending: Infectious Diseases | Admitting: Infectious Diseases

## 2018-08-27 DIAGNOSIS — E042 Nontoxic multinodular goiter: Secondary | ICD-10-CM | POA: Insufficient documentation

## 2018-08-27 DIAGNOSIS — E049 Nontoxic goiter, unspecified: Secondary | ICD-10-CM | POA: Diagnosis present

## 2018-09-02 NOTE — Patient Instructions (Signed)
Thyroid Nodule A thyroid nodule is an isolatedgrowth of thyroid cells that forms a lump in your thyroid gland. The thyroid gland is a butterfly-shaped gland. It is found in the lower front of your neck. This gland sends chemical messengers (hormones) through your blood to all parts of your body. These hormones are important in regulating your body temperature and helping your body to use energy. Thyroid nodules are common. Most are not cancerous (are benign). You may have one nodule or several nodules. Different types of thyroid nodules include:  Nodules that grow and fill with fluid (thyroid cysts).  Nodules that produce too much thyroid hormone (hot nodules or hyperthyroid).  Nodules that produce no thyroid hormone (cold nodules or hypothyroid).  Nodules that form from cancer cells (thyroid cancers).  What are the causes? Usually, the cause of this condition is not known. What increases the risk? Factors that make this condition more likely to develop include:  Increasing age. Thyroid nodules become more common in people who are older than 56 years of age.  Gender. ? Benign thyroid nodules are more common in women. ? Cancerous (malignant) thyroid nodules are more common in men.  A family history that includes: ? Thyroid nodules. ? Pheochromocytoma. ? Thyroid carcinoma. ? Hyperparathyroidism.  Certain kinds of thyroid diseases, such as Hashimoto thyroiditis.  Lack of iodine.  A history of head and neck radiation, such as from X-rays.  What are the signs or symptoms? It is common for this condition to cause no symptoms. If you have symptoms, they may include:  A lump in your lower neck.  Feeling a lump or tickle in your throat.  Pain in your neck, jaw, or ear.  Having trouble swallowing.  Hot nodules may cause symptoms that include:  Weight loss.  Warm, flushed skin.  Feeling hot.  Feeling nervous.  A racing heartbeat.  Cold nodules may cause symptoms that  include:  Weight gain.  Dry skin.  Brittle hair. This may also occur with hair loss.  Feeling cold.  Fatigue.  Thyroid cancer nodules may cause symptoms that include:  Hard nodules that feel stuck to the thyroid gland.  Hoarseness.  Lumps in the glands near your thyroid (lymph nodes).  How is this diagnosed? A thyroid nodule may be felt by your health care provider during a physical exam. This condition may also be diagnosed based on your symptoms. You may also have tests, including:  An ultrasound. This may be done to confirm the diagnosis.  A biopsy. This involves taking a sample from the nodule and looking at it under a microscope to see if the nodule is benign.  Blood tests to make sure that your thyroid is working properly.  Imaging tests such as MRI or CT scan may be done if: ? Your nodule is large. ? Your nodule is blocking your airway. ? Cancer is suspected.  How is this treated? Treatment depends on the cause and size of your nodule or nodules. If the nodule is benign, treatment may not be necessary. Your health care provider may monitor the nodule to see if it goes away without treatment. If the nodule continues to grow, is cancerous, or does not go away:  It may need to be drained with a needle.  It may need to be removed with surgery.  If you have surgery, part or all of your thyroid gland may need to be removed as well. Follow these instructions at home:  Pay attention to any changes in your   nodule.  Take over-the-counter and prescription medicines only as told by your health care provider.  Keep all follow-up visits as told by your health care provider. This is important. Contact a health care provider if:  Your voice changes.  You have trouble swallowing.  You have pain in your neck, ear, or jaw that is getting worse.  Your nodule gets bigger.  Your nodule starts to make it harder for you to breathe. Get help right away if:  You have a  sudden fever.  You feel very weak.  Your muscles look like they are shrinking (muscle wasting).  You have mood swings.  You feel very restless.  You feel confused.  You are seeing or hearing things that other people do not see or hear (having hallucinations).  You feel suddenly nauseous or throw up.  You suddenly have diarrhea.  You have chest pain.  There is a loss of consciousness. This information is not intended to replace advice given to you by your health care provider. Make sure you discuss any questions you have with your health care provider. Document Released: 10/06/2004 Document Revised: 07/16/2016 Document Reviewed: 02/24/2015 Elsevier Interactive Patient Education  2018 Elsevier Inc.  

## 2018-09-04 ENCOUNTER — Other Ambulatory Visit: Payer: Self-pay | Admitting: Family Medicine

## 2018-09-04 DIAGNOSIS — J4520 Mild intermittent asthma, uncomplicated: Secondary | ICD-10-CM

## 2018-09-05 ENCOUNTER — Other Ambulatory Visit: Payer: Self-pay

## 2018-09-05 DIAGNOSIS — J4520 Mild intermittent asthma, uncomplicated: Secondary | ICD-10-CM

## 2018-09-05 MED ORDER — FLUTICASONE PROPIONATE 50 MCG/ACT NA SUSP: NASAL | 5 refills | Status: DC

## 2018-09-25 ENCOUNTER — Other Ambulatory Visit: Payer: Medicare Other

## 2018-10-04 ENCOUNTER — Other Ambulatory Visit: Payer: Self-pay | Admitting: Family Medicine

## 2018-10-04 DIAGNOSIS — J4520 Mild intermittent asthma, uncomplicated: Secondary | ICD-10-CM

## 2018-10-07 ENCOUNTER — Other Ambulatory Visit: Payer: Self-pay

## 2018-10-07 MED ORDER — TRIAMCINOLONE ACETONIDE 0.1 % EX CREA: TOPICAL_CREAM | CUTANEOUS | 3 refills | Status: DC

## 2018-10-09 ENCOUNTER — Encounter: Payer: Self-pay | Admitting: Family Medicine

## 2018-10-09 ENCOUNTER — Ambulatory Visit (INDEPENDENT_AMBULATORY_CARE_PROVIDER_SITE_OTHER): Payer: Medicare Other | Admitting: Infectious Diseases

## 2018-10-09 ENCOUNTER — Encounter: Payer: Self-pay | Admitting: Infectious Diseases

## 2018-10-09 ENCOUNTER — Ambulatory Visit (INDEPENDENT_AMBULATORY_CARE_PROVIDER_SITE_OTHER): Payer: Medicare Other | Admitting: Family Medicine

## 2018-10-09 ENCOUNTER — Encounter: Payer: Medicare Other | Admitting: Infectious Diseases

## 2018-10-09 VITALS — BP 124/70 | Wt 226.0 lb

## 2018-10-09 VITALS — BP 124/70 | HR 64 | Temp 98.0°F | Ht 65.0 in | Wt 226.0 lb

## 2018-10-09 DIAGNOSIS — F411 Generalized anxiety disorder: Secondary | ICD-10-CM

## 2018-10-09 DIAGNOSIS — Z113 Encounter for screening for infections with a predominantly sexual mode of transmission: Secondary | ICD-10-CM

## 2018-10-09 DIAGNOSIS — R7303 Prediabetes: Secondary | ICD-10-CM

## 2018-10-09 DIAGNOSIS — E7849 Other hyperlipidemia: Secondary | ICD-10-CM

## 2018-10-09 DIAGNOSIS — B20 Human immunodeficiency virus [HIV] disease: Secondary | ICD-10-CM

## 2018-10-09 DIAGNOSIS — M545 Low back pain, unspecified: Secondary | ICD-10-CM

## 2018-10-09 DIAGNOSIS — I10 Essential (primary) hypertension: Secondary | ICD-10-CM

## 2018-10-09 DIAGNOSIS — Z79899 Other long term (current) drug therapy: Secondary | ICD-10-CM

## 2018-10-09 DIAGNOSIS — E010 Iodine-deficiency related diffuse (endemic) goiter: Secondary | ICD-10-CM | POA: Diagnosis not present

## 2018-10-09 LAB — POCT URINALYSIS DIP (MANUAL ENTRY)
Bilirubin, UA: NEGATIVE
Glucose, UA: NEGATIVE mg/dL
Ketones, POC UA: NEGATIVE mg/dL
Leukocytes, UA: NEGATIVE
Nitrite, UA: NEGATIVE
Protein Ur, POC: NEGATIVE mg/dL
Spec Grav, UA: 1.015 (ref 1.010–1.025)
Urobilinogen, UA: 0.2 E.U./dL
pH, UA: 5.5 (ref 5.0–8.0)

## 2018-10-09 LAB — POCT GLYCOSYLATED HEMOGLOBIN (HGB A1C): Hemoglobin A1C: 6 % — AB (ref 4.0–5.6)

## 2018-10-09 MED ORDER — CYCLOBENZAPRINE HCL 10 MG PO TABS: 10.0000 mg | ORAL_TABLET | Freq: Three times a day (TID) | ORAL | 0 refills | Status: DC | PRN

## 2018-10-09 MED ORDER — HYDROXYZINE HCL 25 MG PO TABS: 25.0000 mg | ORAL_TABLET | Freq: Every evening | ORAL | 1 refills | Status: DC | PRN

## 2018-10-09 MED ORDER — LORAZEPAM 0.5 MG PO TABS: 0.2500 mg | ORAL_TABLET | Freq: Three times a day (TID) | ORAL | 0 refills | Status: DC | PRN

## 2018-10-09 NOTE — Assessment & Plan Note (Signed)
Greatly appreciate her PCP f/u.  She's had no problem with her atenolol (she is also using "hollistic practices", lavender).

## 2018-10-09 NOTE — Progress Notes (Signed)
   Subjective:    Patient ID: Sheri Davis, female    DOB: 06/28/1961, 57 y.o.   MRN: [id number]  HPI [age] F with hx of HIV+, HTN, hyperlipidemia, pre-diabetes (A1C 6.0% 09-2018). She attributes a lower A1C to drinking apple cider, juicing.  She occas takes ativan for panic attacks. Did not like prozac, cymbalta, seroquel, made her a "zombie".  08-2018 she had a thyroid u/s which showed thyromegally and multiple nodules, none big enough to meet bx criteria. Recommendation to repeat in 1 year. Her TFTs were normal.  She believes her hoarseness is due to the nodules. Has noticed ~ 1 year.    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 Has been getting messages, got mild muscle relaxer from her PCP.  Her PCP has cut back  her sleep rx from ativan (which she also takes for anxiety) to hydroxyzine.   Colon done 2014  HIV 1 RNA Quant (copies/mL)  Date Value  08/15/2018 <20 NOT DETECTED  12/12/2017 <20 NOT DETECTED  04/13/2017 <20 NOT DETECTED   CD4 T Cell Abs (/uL)  Date Value  08/15/2018 1,340  12/12/2017 1,200  04/13/2017 1,600    Review of Systems  Constitutional: Negative for activity change and unexpected weight change.  Respiratory: Negative for cough and shortness of breath.   Cardiovascular: Positive for leg swelling.  Gastrointestinal: Negative for constipation and diarrhea.  Genitourinary: Negative for difficulty urinating.  Musculoskeletal: Positive for back pain and myalgias.  Neurological: Negative for headaches.  Psychiatric/Behavioral: Negative for sleep disturbance.       Objective:   Physical Exam  Constitutional: She is oriented to person, place, and time. She appears well-developed and well-nourished.  HENT:  Mouth/Throat: No oropharyngeal exudate.  Eyes: Pupils are equal, round, and reactive to light. EOM are normal.  Neck: Normal range of motion. Neck supple. Thyromegaly present.  Cardiovascular:  Normal rate, regular rhythm and normal heart sounds.  Pulmonary/Chest: Effort normal and breath sounds normal.  Abdominal: Soft. Bowel sounds are normal. She exhibits no distension. There is no tenderness.  Musculoskeletal: She exhibits no edema.  Neurological: She is alert and oriented to person, place, and time.  Psychiatric: She has a normal mood and affect.       Assessment & Plan:
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
## 2018-10-09 NOTE — Assessment & Plan Note (Signed)
Will repeat her u/s in 1 year.  Greatly appreciate her PCP and NP Dixon!

## 2018-10-09 NOTE — Assessment & Plan Note (Signed)
Greatly appreciate her PCP f/u

## 2018-10-09 NOTE — Assessment & Plan Note (Signed)
She is doing well She does not want to change her ART (previously her INS would not pay).  "If it ain't broke don't fix it" Her health maintainence items are up to date.  Will see her back in 9 months.

## 2018-10-09 NOTE — Patient Instructions (Addendum)
I decreased the dose of the ativan with the hopes of weaning you off over the next few months. I started you on vistaril (hydroxyzine) for the anxiety.   Hydroxyzine capsules or tablets What is this medicine? HYDROXYZINE (hye DROX i zeen) is an antihistamine. This medicine is used to treat allergy symptoms. It is also used to treat anxiety and tension. This medicine can be used with other medicines to induce sleep before surgery. This medicine may be used for other purposes; ask your health care provider or pharmacist if you have questions. COMMON BRAND NAME(S): ANX, Atarax, Rezine, Vistaril What should I tell my health care provider before I take this medicine? They need to know if you have any of these conditions: -any chronic illness -difficulty passing urine -glaucoma -heart disease -kidney disease -liver disease -lung disease -an unusual or allergic reaction to hydroxyzine, cetirizine, other medicines, foods, dyes, or preservatives -pregnant or trying to get pregnant -breast-feeding How should I use this medicine? Take this medicine by mouth with a full glass of water. Follow the directions on the prescription label. You may take this medicine with food or on an empty stomach. Take your medicine at regular intervals. Do not take your medicine more often than directed. Talk to your pediatrician regarding the use of this medicine in children. Special care may be needed. While this drug may be prescribed for children as young as 576 years of age for selected conditions, precautions do apply. Patients over 56 years old may have a stronger reaction and need a smaller dose. Overdosage: If you think you have taken too much of this medicine contact a poison control center or emergency room at once. NOTE: This medicine is only for you. Do not share this medicine with others. What if I miss a dose? If you miss a dose, take it as soon as you can. If it is almost time for your next dose, take only  that dose. Do not take double or extra doses. What may interact with this medicine? -alcohol -barbiturate medicines for sleep or seizures -medicines for colds, allergies -medicines for depression, anxiety, or emotional disturbances -medicines for pain -medicines for sleep -muscle relaxants This list may not describe all possible interactions. Give your health care provider a list of all the medicines, herbs, non-prescription drugs, or dietary supplements you use. Also tell them if you smoke, drink alcohol, or use illegal drugs. Some items may interact with your medicine. What should I watch for while using this medicine? Tell your doctor or health care professional if your symptoms do not improve. You may get drowsy or dizzy. Do not drive, use machinery, or do anything that needs mental alertness until you know how this medicine affects you. Do not stand or sit up quickly, especially if you are an older patient. This reduces the risk of dizzy or fainting spells. Alcohol may interfere with the effect of this medicine. Avoid alcoholic drinks. Your mouth may get dry. Chewing sugarless gum or sucking hard candy, and drinking plenty of water may help. Contact your doctor if the problem does not go away or is severe. This medicine may cause dry eyes and blurred vision. If you wear contact lenses you may feel some discomfort. Lubricating drops may help. See your eye doctor if the problem does not go away or is severe. If you are receiving skin tests for allergies, tell your doctor you are using this medicine. What side effects may I notice from receiving this medicine? Side effects that  you should report to your doctor or health care professional as soon as possible: -fast or irregular heartbeat -difficulty passing urine -seizures -slurred speech or confusion -tremor Side effects that usually do not require medical attention (report to your doctor or health care professional if they continue or are  bothersome): -constipation -drowsiness -fatigue -headache -stomach upset This list may not describe all possible side effects. Call your doctor for medical advice about side effects. You may report side effects to FDA at 1-800-FDA-1088. Where should I keep my medicine? Keep out of the reach of children. Store at room temperature between 15 and 30 degrees C (59 and 86 degrees F). Keep container tightly closed. Throw away any unused medicine after the expiration date. NOTE: This sheet is a summary. It may not cover all possible information. If you have questions about this medicine, talk to your doctor, pharmacist, or health care provider.  2018 Elsevier/Gold Standard (2008-03-27 14:50:59)

## 2018-11-02 ENCOUNTER — Other Ambulatory Visit: Payer: Self-pay | Admitting: Family Medicine

## 2018-11-02 DIAGNOSIS — I10 Essential (primary) hypertension: Secondary | ICD-10-CM

## 2018-11-04 ENCOUNTER — Other Ambulatory Visit: Payer: Self-pay

## 2018-11-04 ENCOUNTER — Other Ambulatory Visit: Payer: Self-pay | Admitting: Family Medicine

## 2018-11-04 ENCOUNTER — Other Ambulatory Visit: Payer: Self-pay | Admitting: Infectious Diseases

## 2018-11-04 DIAGNOSIS — H1010 Acute atopic conjunctivitis, unspecified eye: Secondary | ICD-10-CM

## 2018-11-04 DIAGNOSIS — E7849 Other hyperlipidemia: Secondary | ICD-10-CM

## 2018-11-04 DIAGNOSIS — B20 Human immunodeficiency virus [HIV] disease: Secondary | ICD-10-CM

## 2018-11-04 DIAGNOSIS — J4520 Mild intermittent asthma, uncomplicated: Secondary | ICD-10-CM

## 2018-11-04 MED ORDER — FENOFIBRATE 145 MG PO TABS: 145.0000 mg | ORAL_TABLET | Freq: Every day | ORAL | 0 refills | Status: DC

## 2018-12-05 ENCOUNTER — Other Ambulatory Visit: Payer: Self-pay | Admitting: Infectious Diseases

## 2018-12-05 DIAGNOSIS — B20 Human immunodeficiency virus [HIV] disease: Secondary | ICD-10-CM

## 2018-12-09 ENCOUNTER — Encounter: Payer: Self-pay | Admitting: Infectious Diseases

## 2018-12-09 ENCOUNTER — Ambulatory Visit: Payer: Medicare Other

## 2018-12-30 ENCOUNTER — Other Ambulatory Visit: Payer: Self-pay | Admitting: Family Medicine

## 2018-12-30 DIAGNOSIS — J4520 Mild intermittent asthma, uncomplicated: Secondary | ICD-10-CM

## 2018-12-30 DIAGNOSIS — H1010 Acute atopic conjunctivitis, unspecified eye: Secondary | ICD-10-CM

## 2018-12-31 ENCOUNTER — Other Ambulatory Visit: Payer: Self-pay

## 2018-12-31 DIAGNOSIS — M545 Low back pain, unspecified: Secondary | ICD-10-CM

## 2018-12-31 DIAGNOSIS — I10 Essential (primary) hypertension: Secondary | ICD-10-CM

## 2018-12-31 DIAGNOSIS — J4520 Mild intermittent asthma, uncomplicated: Secondary | ICD-10-CM

## 2018-12-31 DIAGNOSIS — H1010 Acute atopic conjunctivitis, unspecified eye: Secondary | ICD-10-CM

## 2018-12-31 MED ORDER — FLUTICASONE PROPIONATE HFA 110 MCG/ACT IN AERO: INHALATION_SPRAY | RESPIRATORY_TRACT | 0 refills | Status: DC

## 2018-12-31 MED ORDER — CROMOLYN SODIUM 4 % OP SOLN: OPHTHALMIC | 0 refills | Status: DC

## 2018-12-31 MED ORDER — CETIRIZINE HCL 10 MG PO TABS: 10.0000 mg | ORAL_TABLET | Freq: Every day | ORAL | 0 refills | Status: DC

## 2018-12-31 MED ORDER — ATENOLOL 25 MG PO TABS: 25.0000 mg | ORAL_TABLET | Freq: Every day | ORAL | 0 refills | Status: DC

## 2018-12-31 NOTE — Telephone Encounter (Signed)
Please advise if cyclobenzaprine can be refilled.

## 2018-12-31 NOTE — Telephone Encounter (Signed)
Refill request for your patient

## 2019-01-01 ENCOUNTER — Other Ambulatory Visit: Payer: Self-pay | Admitting: Family Medicine

## 2019-01-01 DIAGNOSIS — M545 Low back pain, unspecified: Secondary | ICD-10-CM

## 2019-01-02 MED ORDER — ALBUTEROL SULFATE HFA 108 (90 BASE) MCG/ACT IN AERS: INHALATION_SPRAY | RESPIRATORY_TRACT | 0 refills | Status: DC

## 2019-01-02 MED ORDER — CYCLOBENZAPRINE HCL 10 MG PO TABS: 10.0000 mg | ORAL_TABLET | Freq: Three times a day (TID) | ORAL | 0 refills | Status: DC | PRN

## 2019-01-02 NOTE — Telephone Encounter (Signed)
Sheri Davis, Please advise if this can be filled.

## 2019-02-14 IMAGING — MG MM DIGITAL SCREENING BILAT W/ TOMO W/ CAD
8 series · 8 of 24 positions shown · non-contrast
Comparison: Previous exam(s).

CLINICAL DATA: Screening.

EXAM:
DIGITAL SCREENING BILATERAL MAMMOGRAM WITH TOMO AND CAD

[L CC synth-2D]
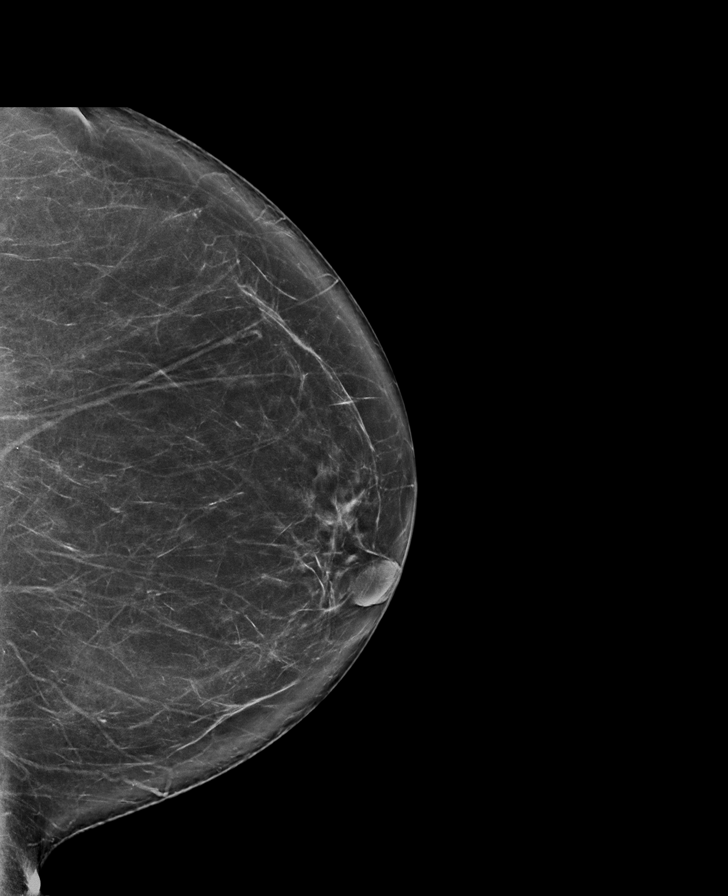

[R CC synth-2D]
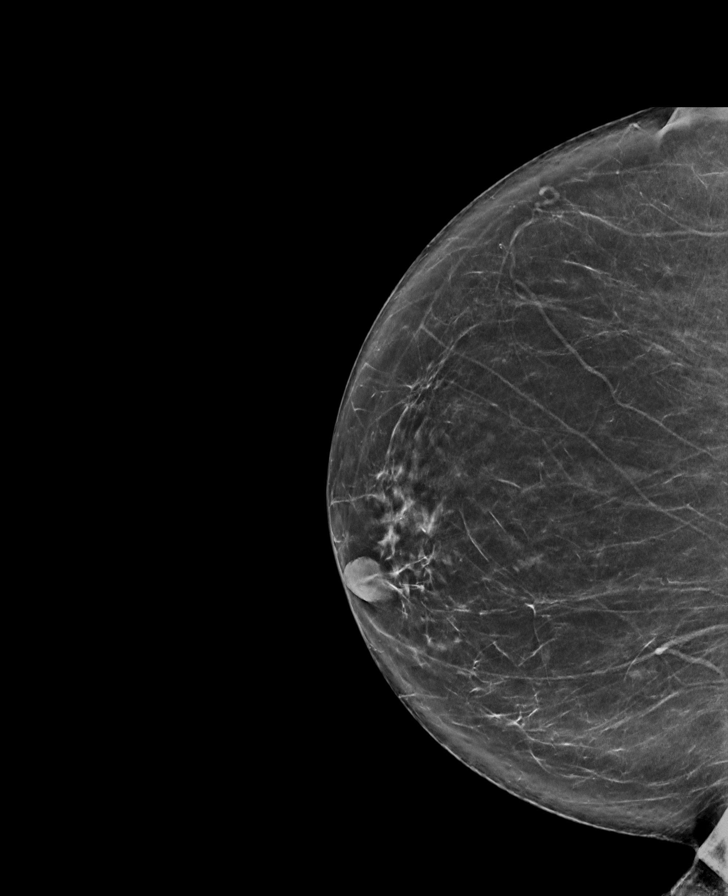

[R MLO synth-2D]
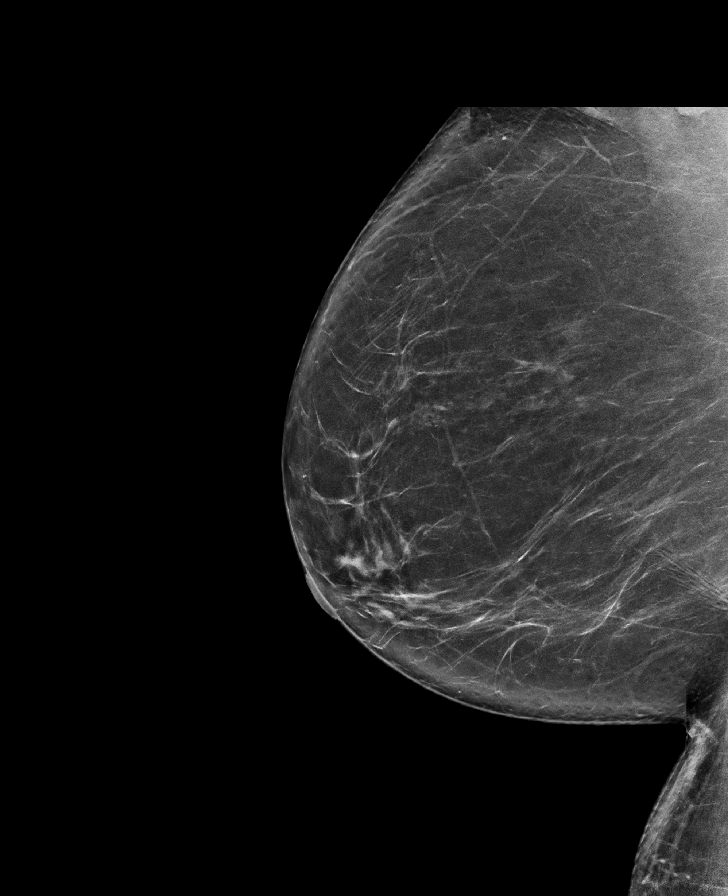

[L MLO synth-2D]
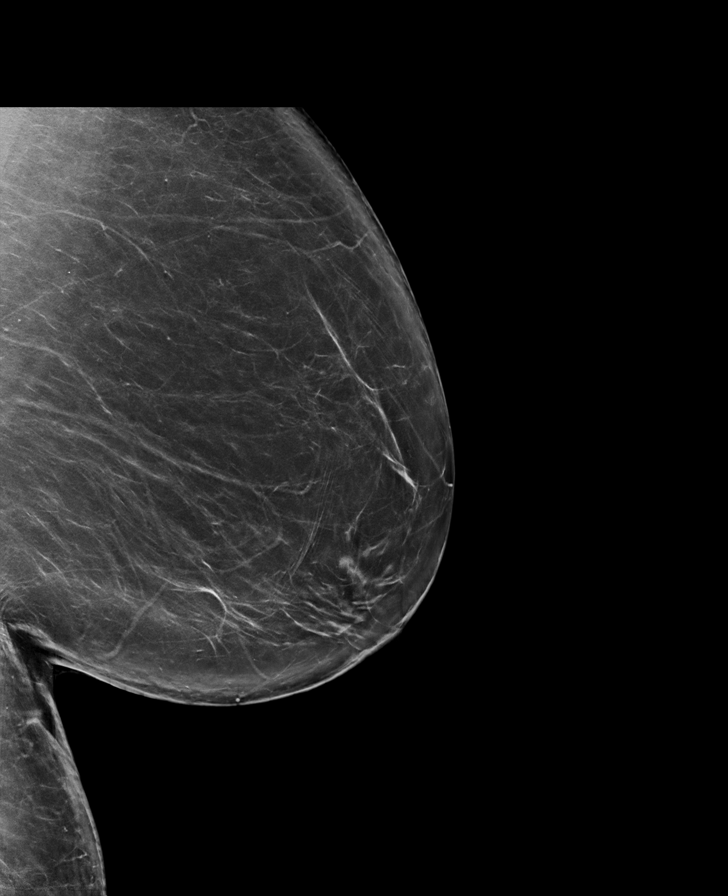

[R MLO tomo · tomo slice 45/89.0]
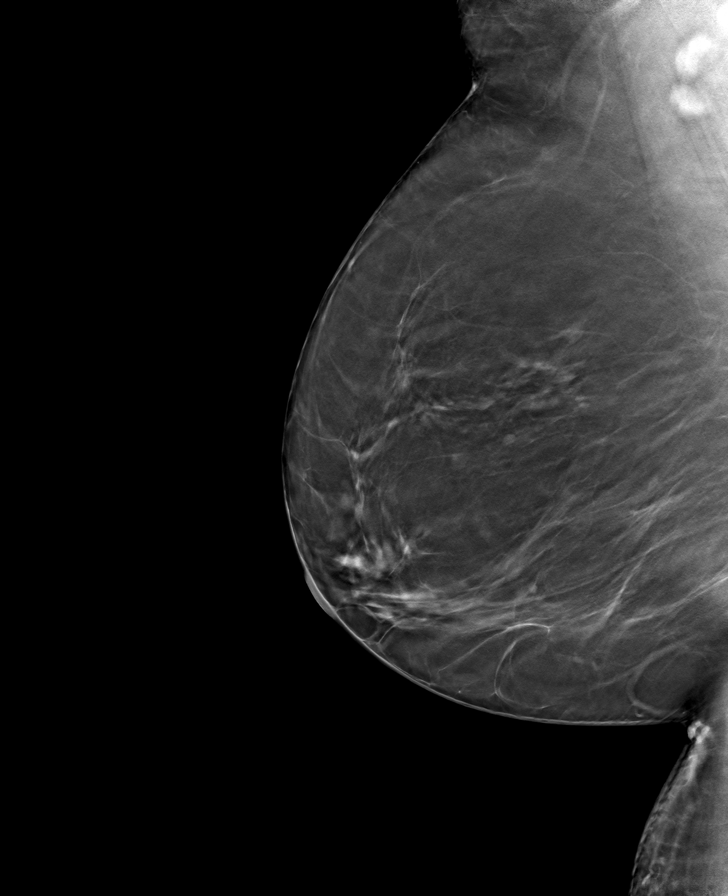

[R CC tomo · tomo slice 41/80.0]
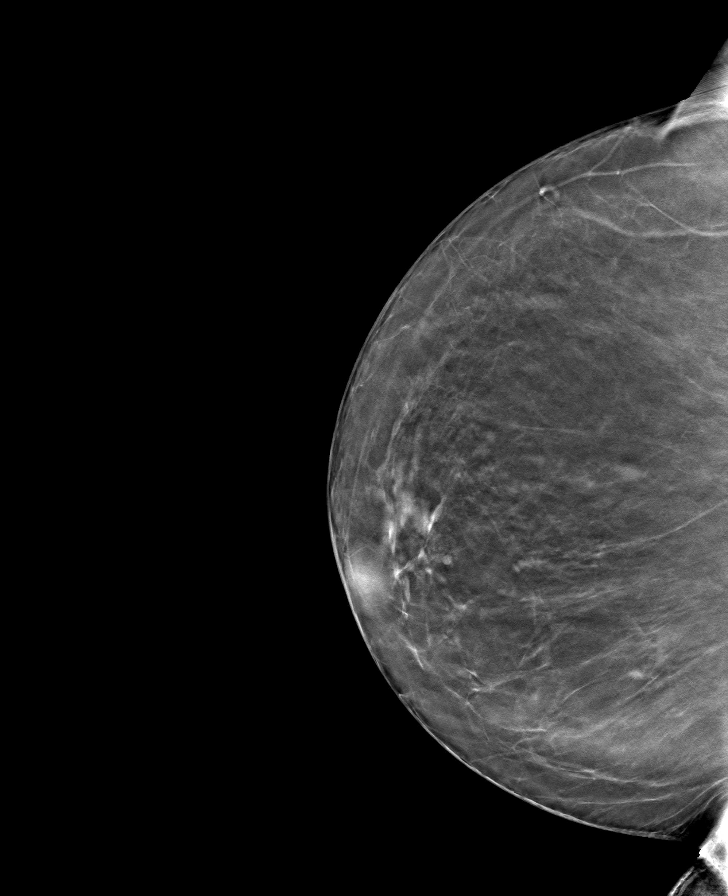

[L MLO tomo · tomo slice 47/94.0]
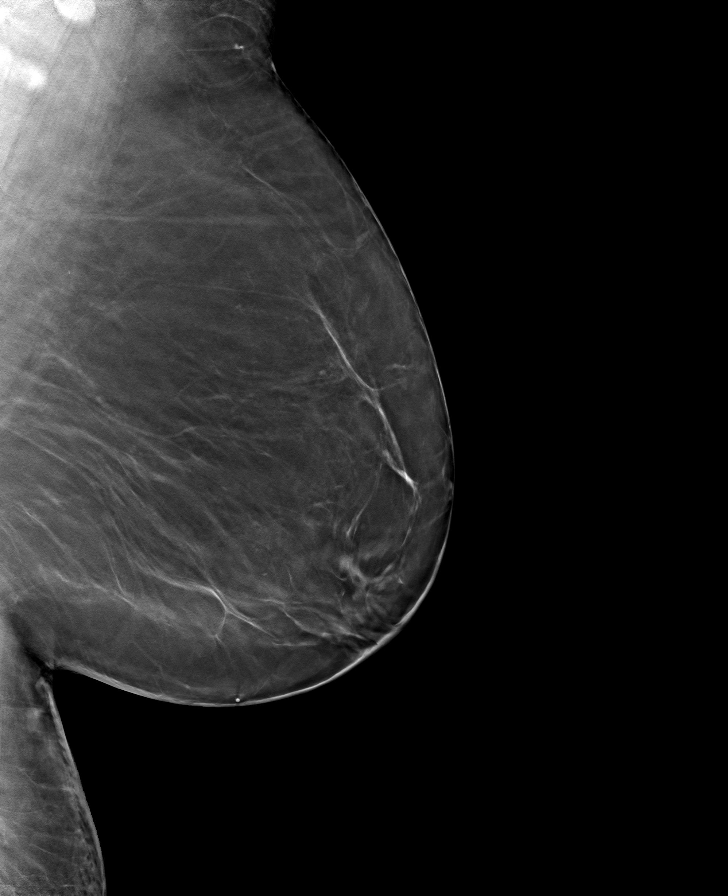

[L CC tomo · tomo slice 42/83.0]
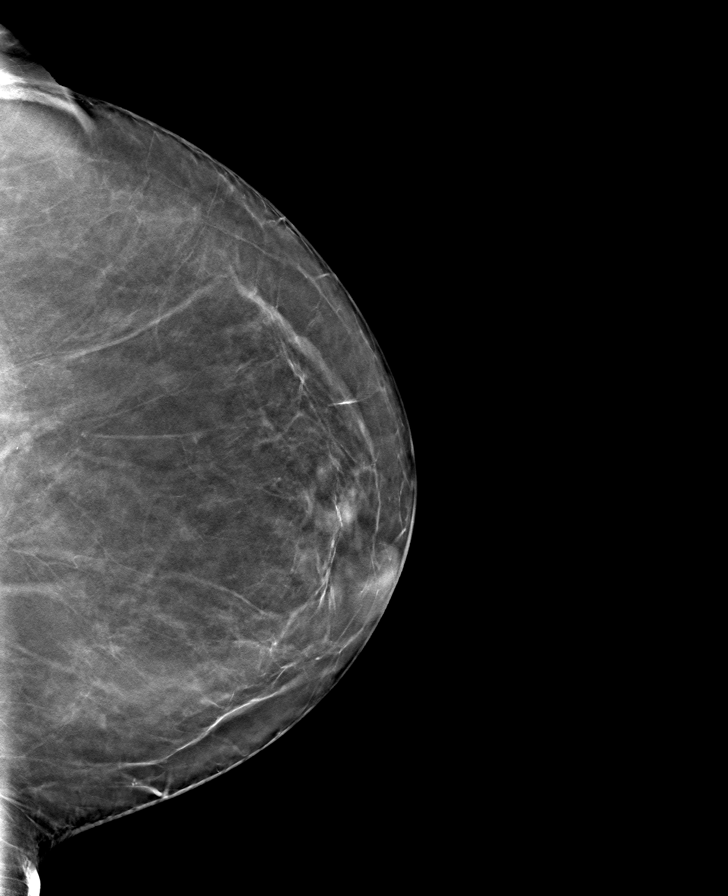

[8 of 24 positions shown; findings below may reference images not displayed]

ACR Breast Density Category b: There are scattered areas of
fibroglandular density.
FINDINGS: There are no findings suspicious for malignancy. Images were
processed with CAD.
IMPRESSION: No mammographic evidence of malignancy. A result letter of this
screening mammogram will be mailed directly to the patient.

RECOMMENDATION:
Screening mammogram in one year. (Code:[id number])

BI-RADS CATEGORY  1: Negative.

## 2019-02-24 ENCOUNTER — Other Ambulatory Visit: Payer: Self-pay | Admitting: Family Medicine

## 2019-02-24 DIAGNOSIS — H1010 Acute atopic conjunctivitis, unspecified eye: Secondary | ICD-10-CM

## 2019-02-24 DIAGNOSIS — J4520 Mild intermittent asthma, uncomplicated: Secondary | ICD-10-CM

## 2019-03-14 ENCOUNTER — Other Ambulatory Visit: Payer: Self-pay | Admitting: Family Medicine

## 2019-03-14 DIAGNOSIS — F411 Generalized anxiety disorder: Secondary | ICD-10-CM

## 2019-03-14 NOTE — Telephone Encounter (Signed)
Refill request for Lorazepam. Last visit on 09/2018. Patient does have a follow up for 04/09/2019. Please advise. Thanks!

## 2019-03-14 NOTE — Telephone Encounter (Signed)
Patient weaning off this medication due to it not being recommended for long term use. Reduced to 0.25mg  Q12H PRN.

## 2019-03-21 ENCOUNTER — Other Ambulatory Visit: Payer: Self-pay | Admitting: Family Medicine

## 2019-03-21 DIAGNOSIS — J4520 Mild intermittent asthma, uncomplicated: Secondary | ICD-10-CM

## 2019-03-21 DIAGNOSIS — H1010 Acute atopic conjunctivitis, unspecified eye: Secondary | ICD-10-CM

## 2019-04-09 ENCOUNTER — Ambulatory Visit: Payer: Medicare Other | Admitting: Family Medicine

## 2019-04-14 ENCOUNTER — Encounter: Payer: Self-pay | Admitting: Family Medicine

## 2019-04-14 ENCOUNTER — Ambulatory Visit (INDEPENDENT_AMBULATORY_CARE_PROVIDER_SITE_OTHER): Payer: Medicare Other | Admitting: Family Medicine

## 2019-04-14 ENCOUNTER — Other Ambulatory Visit: Payer: Self-pay

## 2019-04-14 DIAGNOSIS — I10 Essential (primary) hypertension: Secondary | ICD-10-CM | POA: Diagnosis not present

## 2019-04-14 DIAGNOSIS — E010 Iodine-deficiency related diffuse (endemic) goiter: Secondary | ICD-10-CM | POA: Diagnosis not present

## 2019-04-14 DIAGNOSIS — M545 Low back pain, unspecified: Secondary | ICD-10-CM

## 2019-04-14 DIAGNOSIS — R7303 Prediabetes: Secondary | ICD-10-CM

## 2019-04-14 DIAGNOSIS — E7849 Other hyperlipidemia: Secondary | ICD-10-CM | POA: Diagnosis not present

## 2019-04-14 MED ORDER — CYCLOBENZAPRINE HCL 10 MG PO TABS: 10.0000 mg | ORAL_TABLET | Freq: Three times a day (TID) | ORAL | 0 refills | Status: DC | PRN

## 2019-04-14 NOTE — Progress Notes (Signed)
  [facility]  Virtual Visit via Telephone Note  I connected with Sheri Davis on 04/14/19 at  8:00 AM EDT by telephone and verified that I am speaking with the correct person using two identifiers.   I discussed the limitations, risks, security and privacy concerns of performing an evaluation and management service by telephone and the availability of in person appointments. I also discussed with the patient that there may be a patient responsible charge related to this service. The patient expressed understanding and agreed to proceed.   History of Present Illness: Sheri Davis  has a past medical history of Allergy, Asthma, GERD (gastroesophageal reflux disease), HIV infection (HCC), Hyperlipidemia, and Tachycardia (y-13). Patient reports a history of thyroid nodules and thyromegaly. She states that she is concerned that the nodules are on her vocal cords due to having hoarseness upon awakening. She states that she is to follow up for another thyroid u/s and is requesting a referral to endocrinology. She reports compliance with medications and denies any side effects. Last A1C was 6.0 in 09/2018.   Observations/Objective: Patient with regular voice tone, rate and rhythm. Speaking calmly and is in no apparent distress.    Assessment and Plan: 1. Low back pain without sciatica, unspecified back pain laterality, unspecified chronicity - cyclobenzaprine (FLEXERIL) 10 MG tablet; Take 1 tablet (10 mg total) by mouth 3 (three) times daily as needed for muscle spasms.  Dispense: 30 tablet; Refill: 0  2. Essential hypertension - Comprehensive metabolic panel  3. Other hyperlipidemia - Lipid Panel  4. Thyromegaly - Ambulatory referral to Endocrinology - Thyroid Panel With TSH  5. Prediabetes - Hemoglobin A1c; Future   No medication changes warranted at the present time.  She is to continue with specialist appointments.  Referral placed to endocrinology. Will defer to endocrinology for the repeat u/s.   Follow Up Instructions:  We discussed hand washing, using hand sanitizer when soap and water are not available, only going out when absolutely necessary, and social distancing. Explained to patient that she is immunocompromised and will need to take precautions during this time.   I discussed the assessment and treatment plan with the patient. The patient was provided an opportunity to ask questions and all were answered. The patient agreed with the plan and demonstrated an understanding of the instructions.   The patient was advised to call back or seek an in-person evaluation if the symptoms worsen or if the condition fails to improve as anticipated.  I provided 10 minutes of non-face-to-face time during this encounter.  Ms. Andr L. Riley Lam, FNP-BC [facility] [address] [place], [place] [address] [phone]

## 2019-04-17 ENCOUNTER — Other Ambulatory Visit: Payer: Self-pay | Admitting: Family Medicine

## 2019-04-17 DIAGNOSIS — H1010 Acute atopic conjunctivitis, unspecified eye: Secondary | ICD-10-CM

## 2019-04-17 DIAGNOSIS — J4520 Mild intermittent asthma, uncomplicated: Secondary | ICD-10-CM

## 2019-04-23 ENCOUNTER — Other Ambulatory Visit: Payer: Medicare Other

## 2019-04-23 ENCOUNTER — Other Ambulatory Visit: Payer: Self-pay

## 2019-04-23 ENCOUNTER — Telehealth: Payer: Self-pay

## 2019-04-23 DIAGNOSIS — R7303 Prediabetes: Secondary | ICD-10-CM | POA: Diagnosis not present

## 2019-04-23 DIAGNOSIS — E7849 Other hyperlipidemia: Secondary | ICD-10-CM | POA: Diagnosis not present

## 2019-04-23 DIAGNOSIS — I10 Essential (primary) hypertension: Secondary | ICD-10-CM | POA: Diagnosis not present

## 2019-04-23 DIAGNOSIS — E010 Iodine-deficiency related diffuse (endemic) goiter: Secondary | ICD-10-CM | POA: Diagnosis not present

## 2019-04-23 NOTE — Telephone Encounter (Signed)
Called and spoke with patient, advised that Endocrinology referral is being reviewed by Dr. And their office will call to schedule. Thanks!

## 2019-04-24 LAB — COMPREHENSIVE METABOLIC PANEL
ALT: 29 IU/L (ref 0–32)
AST: 28 IU/L (ref 0–40)
Albumin/Globulin Ratio: 2 (ref 1.2–2.2)
Albumin: 4.5 g/dL (ref 3.8–4.9)
Alkaline Phosphatase: 89 IU/L (ref 39–117)
BUN/Creatinine Ratio: 21 (ref 9–23)
BUN: 15 mg/dL (ref 6–24)
Bilirubin Total: <0.2 mg/dL (ref 0.0–1.2)
CO2: 20 mmol/L (ref 20–29)
Calcium: 9.8 mg/dL (ref 8.7–10.2)
Chloride: 100 mmol/L (ref 96–106)
Creatinine, Ser: 0.72 mg/dL (ref 0.57–1.00)
GFR calc Af Amer: 108 mL/min/{1.73_m2} (ref >59)
GFR calc non Af Amer: 93 mL/min/{1.73_m2} (ref >59)
Globulin, Total: 2.3 g/dL (ref 1.5–4.5)
Glucose: 111 mg/dL — ABNORMAL HIGH (ref 65–99)
Potassium: 4.9 mmol/L (ref 3.5–5.2)
Sodium: 135 mmol/L (ref 134–144)
Total Protein: 6.8 g/dL (ref 6.0–8.5)

## 2019-04-24 LAB — THYROID PANEL WITH TSH
Free Thyroxine Index: 1.3 (ref 1.2–4.9)
T3 Uptake Ratio: 20 % — ABNORMAL LOW (ref 24–39)
T4, Total: 6.3 ug/dL (ref 4.5–12.0)
TSH: 0.992 u[IU]/mL (ref 0.450–4.500)

## 2019-04-24 LAB — LIPID PANEL
Chol/HDL Ratio: 3.7 ratio (ref 0.0–4.4)
Cholesterol, Total: 278 mg/dL — ABNORMAL HIGH (ref 100–199)
HDL: 76 mg/dL (ref >39)
LDL Calculated: 176 mg/dL — ABNORMAL HIGH (ref 0–99)
Triglycerides: 131 mg/dL (ref 0–149)
VLDL Cholesterol Cal: 26 mg/dL (ref 5–40)

## 2019-04-24 LAB — HEMOGLOBIN A1C
Est. average glucose Bld gHb Est-mCnc: 140 mg/dL
Hgb A1c MFr Bld: 6.5 % — ABNORMAL HIGH (ref 4.8–5.6)

## 2019-04-25 NOTE — Progress Notes (Signed)
Your labs are stable. Continue with your current medications. Please remember to keep your follow up appointment. If you have problems, questions or concerns, please make an appointment to discuss. Thanks!

## 2019-04-27 ENCOUNTER — Other Ambulatory Visit: Payer: Self-pay | Admitting: Infectious Diseases

## 2019-04-27 DIAGNOSIS — M1712 Unilateral primary osteoarthritis, left knee: Secondary | ICD-10-CM

## 2019-05-02 ENCOUNTER — Ambulatory Visit (INDEPENDENT_AMBULATORY_CARE_PROVIDER_SITE_OTHER): Payer: Medicare Other | Admitting: Internal Medicine

## 2019-05-02 ENCOUNTER — Encounter: Payer: Self-pay | Admitting: Internal Medicine

## 2019-05-02 ENCOUNTER — Other Ambulatory Visit: Payer: Self-pay

## 2019-05-02 VITALS — BP 110/68 | HR 67 | Temp 98.0°F | Ht 65.0 in | Wt 235.8 lb

## 2019-05-02 DIAGNOSIS — E119 Type 2 diabetes mellitus without complications: Secondary | ICD-10-CM | POA: Diagnosis not present

## 2019-05-02 DIAGNOSIS — R490 Dysphonia: Secondary | ICD-10-CM

## 2019-05-02 DIAGNOSIS — E042 Nontoxic multinodular goiter: Secondary | ICD-10-CM

## 2019-05-02 NOTE — Progress Notes (Signed)
Name: Sheri HummerDeatrich R Gironda  MRN/ DOB: [id number], 10/19/1961    Age/ Sex: 58 y.o., female    PCP: Mike Gipouglas, Andre, FNP   Reason for Endocrinology Evaluation: Multinodular Goiter     Date of Initial Endocrinology Evaluation: 05/02/2019     HPI: [name] is a 58 y.o. female with a past medical history of T2DM, HIV and Asthma, GERD.  The patient presented for initial endocrinology clinic visit on 05/02/2019 for consultative assistance with her MNG   Pt was found to have thyromegaly on exam in 07/2018 which prompted a neck ultrasound. Thyroid ultrasound performed in 08/2018 revealed multiple thyroid nodules none meets criteria for FNA but two of them require a repeat follow up in a year.   Pt denies any local neck symptoms such as pain, worsening enlargement or dysphagia   She does endorse voice hoarseness for the past year, this has been stable. Its worse in the morning when she first wakes up and gets better as the day progresses.   She does have GERD and allergies but has been taking a holistic approach and has been off Zantac for sometimes.   No prior exposure to radiation  Pt states she is pre-diabetic and has been managing it with the holistic approach.     Father with Hx of goiter   HISTORY:  Past Medical History:  Past Medical History:  Diagnosis Date  . Allergy   . Asthma   . GERD (gastroesophageal reflux disease)   . HIV infection (HCC)   . Hyperlipidemia   . Tachycardia y-12   Pt was under stress at the time   Past Surgical History:  Past Surgical History:  Procedure Laterality Date  . COLONOSCOPY WITH PROPOFOL N/A 04/22/2013   Procedure: COLONOSCOPY WITH PROPOFOL;  Surgeon: Charolett BumpersMartin K Johnson, MD;  Location: [facility];  Service: Endoscopy;  Laterality: N/A;  . NO PAST SURGERIES        Social History:  reports that she has never smoked. She has never used smokeless tobacco. She reports current alcohol use. She reports that she does not use  drugs.  Family History: family history includes Anemia in her mother; Depression in her mother; Diabetes in her brother and father; Goiter in her father; Heart disease in her mother; Hypertension in her father; Hypokalemia in her mother; Mental illness in her mother; Osteoarthritis in her father.   HOME MEDICATIONS: Allergies as of 05/02/2019   No Known Allergies     Medication List       Accurate as of May 02, 2019 12:15 PM. If you have any questions, ask your nurse or doctor.        albuterol 108 (90 Base) MCG/ACT inhaler Commonly known as:  Ventolin HFA INHALE 1 PUFF INTO THE LUNGS EVERY 4 HOURS AS NEEDED FOR SHORTNESS OF BREATH   albuterol 108 (90 Base) MCG/ACT inhaler Commonly known as:  VENTOLIN HFA INHALE 1 PUFF INTO THE LUNGS EVERY 4 HOURS AS NEEDED FOR SHORTNESS OF BREATH   atenolol 25 MG tablet Commonly known as:  TENORMIN TAKE 1 TABLET(25 MG) BY MOUTH DAILY   atenolol 25 MG tablet Commonly known as:  TENORMIN Take 1 tablet (25 mg total) by mouth daily.   Atripla 600-200-300 MG tablet Generic drug:  efavirenz-emtricitabine-tenofovir TAKE 1 TABLET BY MOUTH AT BEDTIME   cetirizine 10 MG tablet Commonly known as:  ZYRTEC TAKE 1 TABLET(10 MG) BY MOUTH AT BEDTIME   cromolyn 4 % ophthalmic solution Commonly known as:  OPTICROM INSTILL 1 DROP IN BOTH EYES FOUR TIMES DAILY AS NEEDED   cyclobenzaprine 10 MG tablet Commonly known as:  FLEXERIL Take 1 tablet (10 mg total) by mouth 3 (three) times daily as needed for muscle spasms.   fenofibrate 145 MG tablet Commonly known as:  TRICOR Take 1 tablet (145 mg total) by mouth daily.   fluticasone 110 MCG/ACT inhaler Commonly known as:  Flovent HFA INHALE 1 PUFF INTO THE LUNGS DAILY AS NEEDED FOR SHORTNESS OF BREATH   fluticasone 50 MCG/ACT nasal spray Commonly known as:  FLONASE INSTILL 1 SPRAY IN EACH NOSTRIL ONCE A DAY   hydrOXYzine 25 MG tablet Commonly known as:  ATARAX/VISTARIL Take 1 tablet (25 mg total)  by mouth at bedtime as needed.   Medicated Chest Rub 4.73-1.2-2.6 % Oint   meloxicam 7.5 MG tablet Commonly known as:  MOBIC TAKE 1 TABLET(7.5 MG) BY MOUTH DAILY   Olopatadine HCl 0.2 % Soln Apply 1 drop to eye daily as needed.   triamcinolone cream 0.1 % Commonly known as:  KENALOG APPLY AS DIRECTED TO AFFECTED AREA TWICEA DAY AS NEEDED.         REVIEW OF SYSTEMS: A comprehensive ROS was conducted with the patient and is negative except as per HPI and below:  Review of Systems  Constitutional: Positive for weight loss. Negative for fever.  HENT: Negative for congestion and sore throat.   Eyes: Negative for blurred vision and pain.  Respiratory: Negative for cough and shortness of breath.   Cardiovascular: Negative for chest pain and palpitations.  Gastrointestinal: Negative for diarrhea and nausea.  Genitourinary: Negative for frequency.  Neurological: Negative for tingling and tremors.  Endo/Heme/Allergies: Negative for polydipsia.  Psychiatric/Behavioral: Negative for depression. The patient is not nervous/anxious.        OBJECTIVE:  VS: BP 110/68 (BP Location: Left Arm, Patient Position: Sitting, Cuff Size: Large)   Pulse 67   Temp 98 F (36.7 C)   Ht 5\' 5"  (1.651 m)   Wt 235 lb 12.8 oz (107 kg)   SpO2 95%   BMI 39.24 kg/m    Wt Readings from Last 3 Encounters:  05/02/19 235 lb 12.8 oz (107 kg)  10/09/18 226 lb (102.5 kg)  10/09/18 226 lb (102.5 kg)     EXAM: General: Pt appears well and is in NAD  Hydration: Well-hydrated with moist mucous membranes and good skin turgor  Eyes: External eye exam normal without stare, lid lag or exophthalmos.  EOM intact.  PERRL.  Ears, Nose, Throat: Hearing: Grossly intact bilaterally Dental: Good dentition  Throat: Clear without mass, erythema or exudate  Neck: General: Supple without adenopathy. Thyroid: Thyroid size is enlarged.  Bilateral nodules appreciated. No thyroid bruit.  Lungs: Clear with good BS bilat  with no rales, rhonchi, or wheezes  Heart: Auscultation: RRR.  Abdomen: Normoactive bowel sounds, soft, nontender, without masses or organomegaly palpable  Extremities:  BL LE: No pretibial edema normal ROM and strength.  Skin: Hair: Texture and amount normal with gender appropriate distribution Skin Inspection: No rashes. Skin Palpation: Skin temperature, texture, and thickness normal to palpation  Neuro: Cranial nerves: II - XII grossly intact  Motor: Normal strength throughout DTRs: 2+ and symmetric in UE without delay in relaxation phase  Mental Status: Judgment, insight: Intact Orientation: Oriented to time, place, and person Mood and affect: No depression, anxiety, or agitation     DATA REVIEWED: Results for BERDIA, SCHECHTER "DEE" (MRN [id number]) as of 05/02/2019 07:53  Ref.  Range 04/23/2019 08:11  TSH Latest Ref Range: 0.450 - 4.500 uIU/mL 0.992  Thyroxine (T4) Latest Ref Range: 4.5 - 12.0 ug/dL 6.3  Free Thyroxine Index Latest Ref Range: 1.2 - 4.9  1.3  T3 Uptake Ratio Latest Ref Range: 24 - 39 % 20 (L)     Thyroid Ultrasound (09/14/2018):   Nodule # 1:  Location: Right; Superior  Maximum size: 1 cm; Other 2 dimensions: 0.9 x 0.7 cm  Composition: solid/almost completely solid (2)  Echogenicity: hypoechoic (2)  Shape: not taller-than-wide (0)  Margins: smooth (0)  Echogenic foci: none (0)  ACR TI-RADS total points: 4.  ACR TI-RADS risk category: TR4 (4-6 points).  ACR TI-RADS recommendations:  *Given size (>/= 1 - 1.4 cm) and appearance, a follow-up ultrasound in 1 year should be considered based on TI-RADS criteria.  _________________________________________________________  0.9 cm hypoechoic nodule without calcifications, posterior medial right; This nodule does NOT meet TI-RADS criteria for biopsy or dedicated follow-up.  0.9 cm isoechoic nodule without calcifications, lateral mid right; This nodule does NOT meet TI-RADS  criteria for biopsy or dedicated follow-up.  0.9 cm isoechoic mostly solid nodule without calcifications, superior left; This nodule does NOT meet TI-RADS criteria for biopsy or dedicated follow-up.  Nodule # 5:  Location: Left; Mid anterior  Maximum size: 2 cm; Other 2 dimensions: 1.5 x 0.9 cm  Composition: solid/almost completely solid (2)  Echogenicity: isoechoic (1)  Shape: not taller-than-wide (0)  Margins: ill-defined (0)  Echogenic foci: none (0)  ACR TI-RADS total points: 3.  ACR TI-RADS risk category: TR3 (3 points).  ACR TI-RADS recommendations:  *Given size (>/= 1.5 - 2.4 cm) and appearance, a follow-up ultrasound in 1 year should be considered based on TI-RADS criteria.  _________________________________________________________  Nodule # 6:  Location: Left; Mid  Maximum size: 1.2 cm; Other 2 dimensions: 1.1 x 0.8 cm  Composition: solid/almost completely solid (2)  Echogenicity: isoechoic (1)  Shape: not taller-than-wide (0)  Margins: ill-defined (0)  Echogenic foci: none (0)  ACR TI-RADS total points: 3.  ACR TI-RADS risk category: TR3 (3 points).  ACR TI-RADS recommendations:  Given size (<1.4 cm) and appearance, this nodule does NOT meet TI-RADS criteria for biopsy or dedicated follow-up.  _________________________________________________________  0.9 cm hypoechoic nodule without calcifications, inferior left; This nodule does NOT meet TI-RADS criteria for biopsy or dedicated follow-up.  IMPRESSION: 1. Thyromegaly with bilateral nodules. None meets criteria for biopsy. 2. Recommend annual/biennial ultrasound follow-up of superior right and mid left nodules, until stability x5 years confirmed.   ASSESSMENT/PLAN/RECOMMENDATIONS:   1. Multinodular Goiter (MNG):   - Pt is clinically and biochemically euthyroid  - No local neck symptoms, I don't believe her hoarseness is related to thyroid  - Will  repeat thyroid ultrasounds annually for a total of 5 yrs to confirm stability  - I explained to her the risk of malignancy in these nodules is <5 %. We also discussed that over the years these nodules will enlarge but what's important is how fast they grow in what period of time.  - A repeat ultrasound for this October has been entered in the system   2. Voice Hoarseness:  - I don't believe this is related to her thyroid, this is most likely related to GERD, allergies or other ENT related issues.  - Thyroid nodules do cause voice hoarseness but it will not get better as the day progresses, hoarseness is usually steady through the day. - If this continues to be a worrisome issue  to the patient, PCP may consider GI or ENT evaluation up to their discretion.    3. Diabetes Mellitus, Optimally Controlled with Lifestyle Changes:   - I explained to the patient that with a previous A1c of 6.5% in 2015 , indicated a diagnosis of DM since then. Even if her A1c normalized, the patient is still considered a diabetic.  - I explained the importance of acknowledging her diagnosis, otherwise she will be missing on a lot of important screening tools for DM - We discussed the effects of hyperglycemia on vision, nerves, renal function, I also explained to her that recent studies points to some complications ( e.g neuropathy and increased CVD risk ) starts in the prediabetes period . - Praised the patient on lifestyle changes and encouraged continuation.    F/u 16 months    Signed electronically by: Lyndle Herrlich, MD  [facility] Endocrinology  [facility] [address] [place], [place] [address] Phone: [phone] FAX: [phone]   CC: Mike Gip, FNP [address] [place] [address] Phone: [phone] Fax: [phone]   Return to Endocrinology clinic as below: Future Appointments  Date Time Provider Department Center  07/10/2019  9:00 AM  [facility]  07/25/2019  8:45 AM Ginnie Smart, MD [facility]  10/15/2019  8:00 AM Mike Gip, FNP [facility] None  08/31/2020  8:10 AM Nakoma Gotwalt, Konrad Dolores, MD [facility] None

## 2019-05-02 NOTE — Patient Instructions (Signed)
-   Your thyroid nodules are a common condition. Most of thyroid nodules are benign but require monitoring with serial ultrasounds.

## 2019-05-08 ENCOUNTER — Other Ambulatory Visit: Payer: Self-pay | Admitting: Family Medicine

## 2019-05-08 DIAGNOSIS — F411 Generalized anxiety disorder: Secondary | ICD-10-CM

## 2019-05-14 ENCOUNTER — Other Ambulatory Visit: Payer: Self-pay | Admitting: Family Medicine

## 2019-05-14 ENCOUNTER — Other Ambulatory Visit: Payer: Self-pay | Admitting: Infectious Diseases

## 2019-05-14 DIAGNOSIS — I10 Essential (primary) hypertension: Secondary | ICD-10-CM

## 2019-05-14 DIAGNOSIS — B20 Human immunodeficiency virus [HIV] disease: Secondary | ICD-10-CM

## 2019-05-14 DIAGNOSIS — J4520 Mild intermittent asthma, uncomplicated: Secondary | ICD-10-CM

## 2019-05-14 DIAGNOSIS — H1010 Acute atopic conjunctivitis, unspecified eye: Secondary | ICD-10-CM

## 2019-06-08 ENCOUNTER — Other Ambulatory Visit: Payer: Self-pay | Admitting: Family Medicine

## 2019-06-08 DIAGNOSIS — M545 Low back pain, unspecified: Secondary | ICD-10-CM

## 2019-06-09 ENCOUNTER — Other Ambulatory Visit: Payer: Self-pay | Admitting: Family Medicine

## 2019-06-09 DIAGNOSIS — Z1231 Encounter for screening mammogram for malignant neoplasm of breast: Secondary | ICD-10-CM

## 2019-06-09 NOTE — Telephone Encounter (Signed)
Refill request for cyclobenzaprine. Please advise. Thanks!  

## 2019-06-11 ENCOUNTER — Other Ambulatory Visit (HOSPITAL_COMMUNITY)
Admission: RE | Admit: 2019-06-11 | Discharge: 2019-06-11 | Disposition: A | Discharge status: Home / Self Care | Payer: Medicare Other | Source: Ambulatory Visit | Attending: Infectious Diseases | Admitting: Infectious Diseases

## 2019-06-11 ENCOUNTER — Other Ambulatory Visit: Payer: Self-pay | Admitting: Family Medicine

## 2019-06-11 ENCOUNTER — Other Ambulatory Visit: Payer: Medicare Other

## 2019-06-11 ENCOUNTER — Other Ambulatory Visit: Payer: Self-pay

## 2019-06-11 ENCOUNTER — Ambulatory Visit: Payer: Medicare Other

## 2019-06-11 DIAGNOSIS — Z113 Encounter for screening for infections with a predominantly sexual mode of transmission: Secondary | ICD-10-CM | POA: Insufficient documentation

## 2019-06-11 DIAGNOSIS — Z79899 Other long term (current) drug therapy: Secondary | ICD-10-CM

## 2019-06-11 DIAGNOSIS — H1010 Acute atopic conjunctivitis, unspecified eye: Secondary | ICD-10-CM

## 2019-06-11 DIAGNOSIS — J4520 Mild intermittent asthma, uncomplicated: Secondary | ICD-10-CM

## 2019-06-11 DIAGNOSIS — E7849 Other hyperlipidemia: Secondary | ICD-10-CM | POA: Diagnosis not present

## 2019-06-11 DIAGNOSIS — B20 Human immunodeficiency virus [HIV] disease: Secondary | ICD-10-CM | POA: Diagnosis not present

## 2019-06-12 LAB — URINE CYTOLOGY ANCILLARY ONLY
Chlamydia: NEGATIVE
Neisseria Gonorrhea: NEGATIVE

## 2019-06-12 LAB — T-HELPER CELL (CD4) - (RCID CLINIC ONLY)
CD4 % Helper T Cell: 45 % (ref 33–65)
CD4 T Cell Abs: 1511 /uL (ref 400–1790)

## 2019-06-14 LAB — COMPREHENSIVE METABOLIC PANEL
AG Ratio: 1.6 (calc) (ref 1.0–2.5)
ALT: 29 U/L (ref 6–29)
AST: 22 U/L (ref 10–35)
Albumin: 4.2 g/dL (ref 3.6–5.1)
Alkaline phosphatase (APISO): 81 U/L (ref 37–153)
BUN: 17 mg/dL (ref 7–25)
CO2: 26 mmol/L (ref 20–32)
Calcium: 9.4 mg/dL (ref 8.6–10.4)
Chloride: 104 mmol/L (ref 98–110)
Creat: 0.76 mg/dL (ref 0.50–1.05)
Globulin: 2.6 g/dL (calc) (ref 1.9–3.7)
Glucose, Bld: 98 mg/dL (ref 65–99)
Potassium: 4.8 mmol/L (ref 3.5–5.3)
Sodium: 138 mmol/L (ref 135–146)
Total Bilirubin: 0.3 mg/dL (ref 0.2–1.2)
Total Protein: 6.8 g/dL (ref 6.1–8.1)

## 2019-06-14 LAB — CBC
HCT: 37.6 % (ref 35.0–45.0)
Hemoglobin: 12.8 g/dL (ref 11.7–15.5)
MCH: 31.7 pg (ref 27.0–33.0)
MCHC: 34 g/dL (ref 32.0–36.0)
MCV: 93.1 fL (ref 80.0–100.0)
MPV: 10.1 fL (ref 7.5–12.5)
Platelets: 316 10*3/uL (ref 140–400)
RBC: 4.04 10*6/uL (ref 3.80–5.10)
RDW: 12.1 % (ref 11.0–15.0)
WBC: 7.8 10*3/uL (ref 3.8–10.8)

## 2019-06-14 LAB — HIV-1 RNA QUANT-NO REFLEX-BLD
HIV 1 RNA Quant: <20 copies/mL
HIV-1 RNA Quant, Log: <1.3 Log copies/mL

## 2019-06-14 LAB — LIPID PANEL
Cholesterol: 286 mg/dL — ABNORMAL HIGH (ref <200)
HDL: 77 mg/dL (ref >=50)
LDL Cholesterol (Calc): 179 mg/dL (calc) — ABNORMAL HIGH
Non-HDL Cholesterol (Calc): 209 mg/dL (calc) — ABNORMAL HIGH (ref <130)
Total CHOL/HDL Ratio: 3.7 (calc) (ref <5.0)
Triglycerides: 152 mg/dL — ABNORMAL HIGH (ref <150)

## 2019-06-14 LAB — RPR: RPR Ser Ql: NONREACTIVE

## 2019-06-19 ENCOUNTER — Encounter: Payer: Self-pay | Admitting: Infectious Diseases

## 2019-06-25 ENCOUNTER — Telehealth: Payer: Self-pay | Admitting: Infectious Diseases

## 2019-06-25 NOTE — Telephone Encounter (Signed)
COVID-19 Pre-Screening Questions:06/25/19 ° ° °Do you currently have a fever (>100 °F), chills or unexplained body aches? NO ° °Are you currently experiencing new cough, shortness of breath, sore throat, runny nose?NO ° °Have you recently travelled outside the state of [place] in the last 14 days? NO °•  °Have you been in contact with someone that is currently pending confirmation of Covid19 testing or has been confirmed to have the Covid19 virus?  NO ° °**If the patient answers NO to ALL questions -  advise the patient to please call the clinic before coming to the office should any symptoms develop.  ° ° ° °

## 2019-06-26 ENCOUNTER — Ambulatory Visit (INDEPENDENT_AMBULATORY_CARE_PROVIDER_SITE_OTHER): Payer: Medicare Other | Admitting: Infectious Diseases

## 2019-06-26 ENCOUNTER — Other Ambulatory Visit: Payer: Self-pay

## 2019-06-26 ENCOUNTER — Encounter: Payer: Self-pay | Admitting: Infectious Diseases

## 2019-06-26 VITALS — BP 114/77 | HR 69 | Temp 98.5°F

## 2019-06-26 DIAGNOSIS — F411 Generalized anxiety disorder: Secondary | ICD-10-CM

## 2019-06-26 DIAGNOSIS — Z113 Encounter for screening for infections with a predominantly sexual mode of transmission: Secondary | ICD-10-CM | POA: Diagnosis not present

## 2019-06-26 DIAGNOSIS — B20 Human immunodeficiency virus [HIV] disease: Secondary | ICD-10-CM | POA: Diagnosis not present

## 2019-06-26 DIAGNOSIS — J4520 Mild intermittent asthma, uncomplicated: Secondary | ICD-10-CM

## 2019-06-26 DIAGNOSIS — I10 Essential (primary) hypertension: Secondary | ICD-10-CM | POA: Diagnosis not present

## 2019-06-26 DIAGNOSIS — Z79899 Other long term (current) drug therapy: Secondary | ICD-10-CM | POA: Diagnosis not present

## 2019-06-26 DIAGNOSIS — E010 Iodine-deficiency related diffuse (endemic) goiter: Secondary | ICD-10-CM | POA: Diagnosis not present

## 2019-06-26 NOTE — Assessment & Plan Note (Signed)
Appreciate endo f/u.  Yearly u/s.

## 2019-06-26 NOTE — Assessment & Plan Note (Signed)
Doing well  Well controlled.

## 2019-06-26 NOTE — Progress Notes (Signed)
   Subjective:    Patient ID: Sheri Davis, female    DOB: 22-Nov-1961, 58 y.o.   MRN: [id number]  HPI 22 F with hx of HIV+, HTN, hyperlipidemia, pre-diabetes (A1C 6.5% 03-2019).  She occas takes ativan for panic attacks. Did not like prozac, cymbalta, seroquel, made her a "zombie". Is being weaned off ativan, onto hydroxyzine. 08-2018 she had a thyroid u/s which showed thyromegally and multiple nodules, she has had f/u at Endo.   Has been feeling well.  Has questions about her labs. "Do I need a shingles shot?" Has appt for PAP this fall.  Has appt for ENT for her AM allergies. Has sinus d/c. She attributes her hourseness to this (not her thryoid nodules).  Has been working on losing wt.  Works as a Advice worker.   Colon done 2014  HIV 1 RNA Quant (copies/mL)  Date Value  06/11/2019 <20 NOT DETECTED  08/15/2018 <20 NOT DETECTED  12/12/2017 <20 NOT DETECTED   CD4 T Cell Abs (/uL)  Date Value  06/11/2019 1,511  08/15/2018 1,340  12/12/2017 1,200    Review of Systems  Constitutional: Positive for unexpected weight change. Negative for appetite change and fever.  HENT: Positive for voice change.   Respiratory: Negative for cough and shortness of breath.   Gastrointestinal: Negative for constipation and diarrhea.  Genitourinary: Negative for difficulty urinating.  Musculoskeletal: Positive for arthralgias and back pain.  Psychiatric/Behavioral: Negative for dysphoric mood. The patient is nervous/anxious.   Please see HPI. All other systems reviewed and negative.     Objective:   Physical Exam Constitutional:      Appearance: Normal appearance. She is obese.  HENT:     Mouth/Throat:     Mouth: Mucous membranes are moist.     Pharynx: No oropharyngeal exudate.  Eyes:     Extraocular Movements: Extraocular movements intact.     Pupils: Pupils are equal, round, and reactive to light.  Neck:     Musculoskeletal: Normal range of motion and neck  supple.  Cardiovascular:     Rate and Rhythm: Normal rate and regular rhythm.  Pulmonary:     Effort: Pulmonary effort is normal.     Breath sounds: Normal breath sounds.  Abdominal:     General: Bowel sounds are normal. There is no distension.     Palpations: Abdomen is soft.     Tenderness: There is no abdominal tenderness.  Musculoskeletal:     Right lower leg: No edema.     Left lower leg: No edema.  Neurological:     General: No focal deficit present.     Mental Status: She is alert.  Psychiatric:        Mood and Affect: Mood normal.       Assessment & Plan:

## 2019-06-26 NOTE — Assessment & Plan Note (Signed)
She is doing well "It ain't broke, don't try to fix it" as far as updating her ART.  Check her bone density.  Liver and kidney tests normal.  Appreciate [name] and PAP. Will get flu then as well.  Will set up shingrix then.  rtc in 9 months

## 2019-06-26 NOTE — Assessment & Plan Note (Signed)
Fairly well controlled on hydroxizine, decreasing benzo.

## 2019-06-26 NOTE — Assessment & Plan Note (Signed)
Well controlled on her inhalers.

## 2019-07-10 ENCOUNTER — Other Ambulatory Visit: Payer: Self-pay | Admitting: Family Medicine

## 2019-07-10 ENCOUNTER — Other Ambulatory Visit: Payer: Medicare Other

## 2019-07-10 DIAGNOSIS — H1010 Acute atopic conjunctivitis, unspecified eye: Secondary | ICD-10-CM

## 2019-07-10 DIAGNOSIS — J4520 Mild intermittent asthma, uncomplicated: Secondary | ICD-10-CM

## 2019-07-25 ENCOUNTER — Encounter: Payer: Medicare Other | Admitting: Infectious Diseases

## 2019-07-29 ENCOUNTER — Ambulatory Visit
Admission: RE | Admit: 2019-07-29 | Discharge: 2019-07-29 | Disposition: A | Discharge status: Home / Self Care | Payer: Medicare Other | Source: Ambulatory Visit | Attending: Family Medicine | Admitting: Family Medicine

## 2019-07-29 DIAGNOSIS — Z1231 Encounter for screening mammogram for malignant neoplasm of breast: Secondary | ICD-10-CM | POA: Diagnosis not present

## 2019-08-02 ENCOUNTER — Other Ambulatory Visit: Payer: Self-pay | Admitting: Family Medicine

## 2019-08-02 DIAGNOSIS — F411 Generalized anxiety disorder: Secondary | ICD-10-CM

## 2019-08-05 NOTE — Telephone Encounter (Signed)
Refill request for lorazepam. Please advise.  

## 2019-08-06 ENCOUNTER — Encounter (HOSPITAL_COMMUNITY): Payer: Self-pay

## 2019-08-06 ENCOUNTER — Encounter (HOSPITAL_COMMUNITY): Payer: Self-pay | Admitting: *Deleted

## 2019-08-06 NOTE — Telephone Encounter (Signed)
Sent medication to the pharmacy on file. She is weaning off of this medication due to the effects of long term use. I reduced the medication to 0.25mg  oro 1/2 tablet every 12 hours as needed for anxiety.

## 2019-08-09 ENCOUNTER — Other Ambulatory Visit: Payer: Self-pay | Admitting: Family Medicine

## 2019-08-09 DIAGNOSIS — M545 Low back pain, unspecified: Secondary | ICD-10-CM

## 2019-08-11 ENCOUNTER — Other Ambulatory Visit: Payer: Self-pay | Admitting: Family Medicine

## 2019-08-11 DIAGNOSIS — H1010 Acute atopic conjunctivitis, unspecified eye: Secondary | ICD-10-CM

## 2019-08-11 DIAGNOSIS — I10 Essential (primary) hypertension: Secondary | ICD-10-CM

## 2019-08-11 DIAGNOSIS — J4520 Mild intermittent asthma, uncomplicated: Secondary | ICD-10-CM

## 2019-08-11 NOTE — Telephone Encounter (Signed)
Refill request for cyclobenzoparine. Please advise.

## 2019-08-20 ENCOUNTER — Telehealth: Payer: Self-pay

## 2019-08-20 NOTE — Telephone Encounter (Signed)
COVID-19 Pre-Screening Questions:08/20/19   Do you currently have a fever (>100 F), chills or unexplained body aches? NO   Are you currently experiencing new cough, shortness of breath, sore throat, runny nose? NO  .  Have you recently travelled outside the state of [place] in the last 14 days? NO .  Have you been in contact with someone that is currently pending confirmation of Covid19 testing or has been confirmed to have the [facility] virus?  NO **If the patient answers NO to ALL questions -  advise the patient to please call the clinic before coming to the office should any symptoms develop.

## 2019-08-21 ENCOUNTER — Encounter: Payer: Self-pay | Admitting: Infectious Diseases

## 2019-08-21 ENCOUNTER — Ambulatory Visit
Admission: RE | Admit: 2019-08-21 | Discharge: 2019-08-21 | Disposition: A | Discharge status: Home / Self Care | Payer: Medicare Other | Source: Ambulatory Visit | Attending: Internal Medicine | Admitting: Internal Medicine

## 2019-08-21 ENCOUNTER — Other Ambulatory Visit: Payer: Self-pay

## 2019-08-21 ENCOUNTER — Other Ambulatory Visit: Payer: Self-pay | Admitting: Infectious Diseases

## 2019-08-21 ENCOUNTER — Other Ambulatory Visit (HOSPITAL_COMMUNITY)
Admission: RE | Admit: 2019-08-21 | Discharge: 2019-08-21 | Disposition: A | Discharge status: Home / Self Care | Payer: Medicare Other | Source: Ambulatory Visit | Attending: Infectious Diseases | Admitting: Infectious Diseases

## 2019-08-21 ENCOUNTER — Ambulatory Visit (INDEPENDENT_AMBULATORY_CARE_PROVIDER_SITE_OTHER): Payer: Medicare Other | Admitting: Infectious Diseases

## 2019-08-21 VITALS — BP 114/78 | HR 72 | Temp 98.3°F

## 2019-08-21 DIAGNOSIS — Z113 Encounter for screening for infections with a predominantly sexual mode of transmission: Secondary | ICD-10-CM | POA: Insufficient documentation

## 2019-08-21 DIAGNOSIS — Z1151 Encounter for screening for human papillomavirus (HPV): Secondary | ICD-10-CM | POA: Insufficient documentation

## 2019-08-21 DIAGNOSIS — R2242 Localized swelling, mass and lump, left lower limb: Secondary | ICD-10-CM

## 2019-08-21 DIAGNOSIS — Z124 Encounter for screening for malignant neoplasm of cervix: Secondary | ICD-10-CM | POA: Insufficient documentation

## 2019-08-21 DIAGNOSIS — B20 Human immunodeficiency virus [HIV] disease: Secondary | ICD-10-CM | POA: Diagnosis not present

## 2019-08-21 DIAGNOSIS — Z23 Encounter for immunization: Secondary | ICD-10-CM | POA: Diagnosis not present

## 2019-08-21 DIAGNOSIS — Z0000 Encounter for general adult medical examination without abnormal findings: Secondary | ICD-10-CM | POA: Insufficient documentation

## 2019-08-21 DIAGNOSIS — Z21 Asymptomatic human immunodeficiency virus [HIV] infection status: Secondary | ICD-10-CM | POA: Diagnosis not present

## 2019-08-21 DIAGNOSIS — E042 Nontoxic multinodular goiter: Secondary | ICD-10-CM | POA: Diagnosis not present

## 2019-08-21 NOTE — Progress Notes (Signed)
Subjective:    Sheri Davis is a 58 y.o. female here for an annual pelvic exam and pap smear.   Review of Systems: Current GYN complaints or concerns: None.  She describes herself to be "celibate".  When I asked her why she tells me that she feels like she was punished for having sex outside of marriage by acquiring HIV infection.   Patient denies any abdominal/pelvic pain, problems with bowel movements, urination, vaginal discharge or intercourse.   Past Medical History:  Diagnosis Date  . Allergy   . Asthma   . GERD (gastroesophageal reflux disease)   . HIV infection ([place])   . Hyperlipidemia   . Tachycardia y-12   Pt was under stress at the time    Gynecologic History: No obstetric history on file.  No LMP recorded. Patient is postmenopausal. Contraception: abstinence Last Pap: 07/2018. Results were: normal Anal Intercourse: No Last Mammogram: 07/29/2019. Results were: normal  Objective:  Physical Exam  Constitutional: Well developed, well nourished, no acute distress. She is alert and oriented x3.  Pelvic: External genitalia is normal in appearance. The vagina is normal in appearance. The cervix is bulbous and easily visualized. No CMT, normal expected cervical mucus present. Bimanual exam reveals uterus that is felt to be normal size, shape, and contour. No adnexal masses or tenderness noted. Breasts: symmetrical in contour, shape and texture. No palpable masses/nodules. No nipple discharge.  Psych: She has a normal mood and affect.    Assessment:  Well controlled HIV Normal pelvic exam Soft tissue nodule on inner upper thigh, chronic and unchanged greater than 10 years  Plan:   Problem List Items Addressed This Visit      Unprioritized   Human immunodeficiency virus (HIV) disease ([facility]) (Chronic)    She has very well-controlled HIV.  Currently taking a Atripla once a day correctly without food.  She clearly has some emotional distress over her HIV  diagnosis and thoughts and believes regarding how it is a punishment for her.  She has been through counseling in the past and mentioned it was helpful and she has many more grounding techniques and ability to work through some of her feelings.  It sounds like she also lost her mother the same time she was diagnosed 3 years ago.  I reminded her that we have counseling services available should she feel like she needs a refresher.  She thanked me and will consider for future.      Screening for cervical cancer - Primary    Normal pelvic exam.  Cervical brushings sent for cytology/HPV and urine for gonorrhea chlamydia routine testing today.   Discussed recommended screening interval for women living with HIV disease.  I told her that she would be a good candidate for every 3-year exams and barring today's test is again normal we will make that change to her medical record. Results will be communicated to the patient via my chart       Relevant Orders   Cytology - PAP( [facility])   Healthcare maintenance    Flu shot today. Pap updated today. She has been counseled and instructed how to perform monthly self breast exams. Screening mammogram to be scheduled 07-2020.       Nodule of skin of left lower leg    We will refer her to general surgery for consideration of removal.  This is because some cosmetic issues for her.  She thanked me for helping her get this arranged.  Relevant Orders   Ambulatory referral to General Surgery    Other Visit Diagnoses    Screening for STDs (sexually transmitted diseases)       Relevant Orders   Urine cytology ancillary only     Rexene Alberts, MSN, NP-C [facility] Office: [phone] Pager: [phone]  08/21/19 1:59 PM

## 2019-08-21 NOTE — Assessment & Plan Note (Signed)
We will refer her to general surgery for consideration of removal.  This is because some cosmetic issues for her.  She thanked me for helping her get this arranged.

## 2019-08-21 NOTE — Assessment & Plan Note (Signed)
She has very well-controlled HIV.  Currently taking a Atripla once a day correctly without food.  She clearly has some emotional distress over her HIV diagnosis and thoughts and believes regarding how it is a punishment for her.  She has been through counseling in the past and mentioned it was helpful and she has many more grounding techniques and ability to work through some of her feelings.  It sounds like she also lost her mother the same time she was diagnosed 3 years ago.  I reminded her that we have counseling services available should she feel like she needs a refresher.  She thanked me and will consider for future.

## 2019-08-21 NOTE — Assessment & Plan Note (Signed)
Flu shot today. Pap updated today. She has been counseled and instructed how to perform monthly self breast exams. Screening mammogram to be scheduled 07-2020.

## 2019-08-21 NOTE — Assessment & Plan Note (Signed)
Normal pelvic exam.  Cervical brushings sent for cytology/HPV and urine for gonorrhea chlamydia routine testing today.   Discussed recommended screening interval for women living with HIV disease.  I told Sheri Davis that she would be a good candidate for every 3-year exams and barring today's test is again normal we will make that change to Sheri Davis medical record. Results will be communicated to the patient via my chart

## 2019-08-21 NOTE — Patient Instructions (Addendum)
Nice to see you today!  Will refer you to general surgery to see about getting that cyst or lipoma removed on your leg.    Will call you with your results and discuss next steps for screening.   If you feel like you may need some more sessions for talk therapy we would love to introduce you to [facility].

## 2019-08-22 LAB — URINE CYTOLOGY ANCILLARY ONLY
Chlamydia: NEGATIVE
Neisseria Gonorrhea: NEGATIVE

## 2019-08-28 LAB — CYTOLOGY - PAP
Adequacy: ABSENT
Diagnosis: NEGATIVE
High risk HPV: NEGATIVE
Molecular Disclaimer: 56
Molecular Disclaimer: NORMAL

## 2019-08-28 NOTE — Progress Notes (Signed)
Patient with normal pap smear and negative HPV. With 3 consecutive negative tests will space her out to every 3 years for screenings. MyChart sent to communicate results/plan with patient.

## 2019-08-29 ENCOUNTER — Other Ambulatory Visit: Payer: Medicare Other

## 2019-09-08 ENCOUNTER — Other Ambulatory Visit: Payer: Self-pay | Admitting: Family Medicine

## 2019-09-08 DIAGNOSIS — J4520 Mild intermittent asthma, uncomplicated: Secondary | ICD-10-CM

## 2019-09-08 DIAGNOSIS — H1010 Acute atopic conjunctivitis, unspecified eye: Secondary | ICD-10-CM

## 2019-09-14 DIAGNOSIS — Z20828 Contact with and (suspected) exposure to other viral communicable diseases: Secondary | ICD-10-CM | POA: Diagnosis not present

## 2019-10-02 ENCOUNTER — Other Ambulatory Visit: Payer: Self-pay

## 2019-10-02 DIAGNOSIS — J4520 Mild intermittent asthma, uncomplicated: Secondary | ICD-10-CM

## 2019-10-02 DIAGNOSIS — H1010 Acute atopic conjunctivitis, unspecified eye: Secondary | ICD-10-CM

## 2019-10-02 MED ORDER — CROMOLYN SODIUM 4 % OP SOLN: OPHTHALMIC | 3 refills | Status: DC

## 2019-10-02 MED ORDER — ALBUTEROL SULFATE HFA 108 (90 BASE) MCG/ACT IN AERS: INHALATION_SPRAY | RESPIRATORY_TRACT | 5 refills | Status: DC

## 2019-10-02 MED ORDER — CETIRIZINE HCL 10 MG PO TABS: 10.0000 mg | ORAL_TABLET | Freq: Every day | ORAL | 3 refills | Status: DC

## 2019-10-03 ENCOUNTER — Other Ambulatory Visit: Payer: Self-pay | Admitting: Family Medicine

## 2019-10-03 DIAGNOSIS — M545 Low back pain, unspecified: Secondary | ICD-10-CM

## 2019-10-12 ENCOUNTER — Other Ambulatory Visit: Payer: Self-pay | Admitting: Family Medicine

## 2019-10-12 DIAGNOSIS — F411 Generalized anxiety disorder: Secondary | ICD-10-CM

## 2019-10-15 ENCOUNTER — Ambulatory Visit: Payer: Medicare Other | Admitting: Family Medicine

## 2019-10-24 ENCOUNTER — Other Ambulatory Visit: Payer: Self-pay | Admitting: Infectious Diseases

## 2019-10-24 ENCOUNTER — Other Ambulatory Visit: Payer: Self-pay | Admitting: Family Medicine

## 2019-10-24 DIAGNOSIS — I10 Essential (primary) hypertension: Secondary | ICD-10-CM

## 2019-10-24 DIAGNOSIS — B20 Human immunodeficiency virus [HIV] disease: Secondary | ICD-10-CM

## 2019-11-18 ENCOUNTER — Other Ambulatory Visit: Payer: Self-pay | Admitting: Family Medicine

## 2019-11-18 DIAGNOSIS — J4520 Mild intermittent asthma, uncomplicated: Secondary | ICD-10-CM

## 2019-11-18 DIAGNOSIS — M545 Low back pain, unspecified: Secondary | ICD-10-CM

## 2019-11-24 NOTE — Telephone Encounter (Signed)
Refill request: Flovent & Cyclobenzaprine. Please advise.

## 2019-12-17 ENCOUNTER — Other Ambulatory Visit: Payer: Self-pay | Admitting: Family Medicine

## 2019-12-17 DIAGNOSIS — J4520 Mild intermittent asthma, uncomplicated: Secondary | ICD-10-CM

## 2019-12-17 DIAGNOSIS — M545 Low back pain, unspecified: Secondary | ICD-10-CM

## 2019-12-22 ENCOUNTER — Other Ambulatory Visit: Payer: Self-pay

## 2019-12-22 ENCOUNTER — Ambulatory Visit: Payer: Self-pay

## 2019-12-23 ENCOUNTER — Ambulatory Visit: Payer: Medicare Other | Admitting: Family Medicine

## 2020-01-05 ENCOUNTER — Ambulatory Visit: Payer: Self-pay | Admitting: Nurse Practitioner

## 2020-01-09 ENCOUNTER — Encounter: Payer: Self-pay | Admitting: Infectious Diseases

## 2020-01-12 ENCOUNTER — Ambulatory Visit: Payer: Self-pay | Admitting: Nurse Practitioner

## 2020-01-16 ENCOUNTER — Other Ambulatory Visit: Payer: Self-pay

## 2020-01-16 DIAGNOSIS — J4520 Mild intermittent asthma, uncomplicated: Secondary | ICD-10-CM

## 2020-01-16 DIAGNOSIS — M545 Low back pain, unspecified: Secondary | ICD-10-CM

## 2020-01-16 DIAGNOSIS — H1010 Acute atopic conjunctivitis, unspecified eye: Secondary | ICD-10-CM

## 2020-01-16 DIAGNOSIS — I10 Essential (primary) hypertension: Secondary | ICD-10-CM

## 2020-01-16 MED ORDER — ATENOLOL 25 MG PO TABS: ORAL_TABLET | ORAL | 0 refills | Status: DC

## 2020-01-16 MED ORDER — CETIRIZINE HCL 10 MG PO TABS: 10.0000 mg | ORAL_TABLET | Freq: Every day | ORAL | 3 refills | Status: DC

## 2020-01-16 MED ORDER — CROMOLYN SODIUM 4 % OP SOLN: OPHTHALMIC | 3 refills | Status: DC

## 2020-01-16 MED ORDER — CYCLOBENZAPRINE HCL 10 MG PO TABS: ORAL_TABLET | ORAL | 0 refills | Status: DC

## 2020-01-19 ENCOUNTER — Ambulatory Visit: Payer: Self-pay | Admitting: Nurse Practitioner

## 2020-01-23 ENCOUNTER — Telehealth: Payer: Self-pay | Admitting: Nurse Practitioner

## 2020-01-23 NOTE — Telephone Encounter (Signed)
Pt was called  No messages was left no one answered

## 2020-01-26 ENCOUNTER — Encounter: Payer: Self-pay | Admitting: Nurse Practitioner

## 2020-01-26 ENCOUNTER — Other Ambulatory Visit: Payer: Self-pay

## 2020-01-26 ENCOUNTER — Ambulatory Visit (INDEPENDENT_AMBULATORY_CARE_PROVIDER_SITE_OTHER): Payer: Self-pay | Admitting: Nurse Practitioner

## 2020-01-26 VITALS — BP 128/62 | HR 70 | Temp 99.1°F | Resp 16 | Ht 65.0 in | Wt 220.0 lb

## 2020-01-26 DIAGNOSIS — Z0000 Encounter for general adult medical examination without abnormal findings: Secondary | ICD-10-CM

## 2020-01-26 DIAGNOSIS — I10 Essential (primary) hypertension: Secondary | ICD-10-CM

## 2020-01-26 DIAGNOSIS — E119 Type 2 diabetes mellitus without complications: Secondary | ICD-10-CM

## 2020-01-26 DIAGNOSIS — F411 Generalized anxiety disorder: Secondary | ICD-10-CM

## 2020-01-26 DIAGNOSIS — E782 Mixed hyperlipidemia: Secondary | ICD-10-CM

## 2020-01-26 DIAGNOSIS — Z6836 Body mass index (BMI) 36.0-36.9, adult: Secondary | ICD-10-CM

## 2020-01-26 DIAGNOSIS — E010 Iodine-deficiency related diffuse (endemic) goiter: Secondary | ICD-10-CM

## 2020-01-26 DIAGNOSIS — B20 Human immunodeficiency virus [HIV] disease: Secondary | ICD-10-CM

## 2020-01-26 LAB — POCT URINALYSIS DIPSTICK
Bilirubin, UA: NEGATIVE
Blood, UA: NEGATIVE
Glucose, UA: NEGATIVE
Ketones, UA: NEGATIVE
Leukocytes, UA: NEGATIVE
Nitrite, UA: NEGATIVE
Protein, UA: NEGATIVE
Spec Grav, UA: 1.025 (ref 1.010–1.025)
Urobilinogen, UA: 0.2 E.U./dL
pH, UA: 6.5 (ref 5.0–8.0)

## 2020-01-26 LAB — POCT GLYCOSYLATED HEMOGLOBIN (HGB A1C): Hemoglobin A1C: 6 % — AB (ref 4.0–5.6)

## 2020-01-26 MED ORDER — LORAZEPAM 0.5 MG PO TABS: 0.5000 mg | ORAL_TABLET | ORAL | 0 refills | Status: DC | PRN

## 2020-01-26 NOTE — Progress Notes (Signed)
Established Patient Office Visit  Subjective:  Patient ID: Sheri Davis, female    DOB: [birth year]  Age: 59 y.o. MRN: [id number]  CC:  Chief Complaint  Patient presents with  . Hypertension  . Medication Refill    HPI Sheri Davis presents for follow-up.  She is a previous patient of Sheri Davis, [place].  She  has a past medical history of Allergy, Asthma, GERD (gastroesophageal reflux disease), HIV infection ([place]), Hyperlipidemia, and Tachycardia (y-12). Hypertension: She is currently on atenolol 25 mg daily.  She denies any headaches, visual changes, shortness of breath, dyspnea on exertion, chest pains, nausea, vomiting, swelling in legs feet or ankles. She has a history of anxiety.  She is currently on lorazepam 0.5 mg as needed.  She admits that the dose was decreased because she is being weaned off.  Her last fill date was October 04, 2019 quantity 15.  She denies any concerns today with anxiety or depression.  She is happy because she was recently assisted into a new home as she is able to reside next to her father.  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 She has a history of prediabetes.  Last A1c 6.5%.  She admits that she has been monitoring her diet and she has lost several pounds.  She is very encouraged and does want to continue to lose weight.  She admits that she is very concerned about her health and wants to do the best for it. She has a history of thyroid nodules she is status post ultrasound.  She admits that the nodules have creased in size.  There is a family history father had a goiter.  She admits that her previous TSH has been normal.  Past Medical History:  Diagnosis Date  . Allergy   . Asthma   . GERD (gastroesophageal reflux disease)   . HIV infection ([place])   . Hyperlipidemia   . Tachycardia y-12   Pt was under stress at the time    Past Surgical History:  Procedure Laterality Date  . COLONOSCOPY WITH PROPOFOL N/A  04/22/2013   Procedure: COLONOSCOPY WITH PROPOFOL;  Surgeon: Sheri Fair, MD;  Location: [facility];  Service: Endoscopy;  Laterality: N/A;  . NO PAST SURGERIES      Family History  Problem Relation Age of Onset  . Osteoarthritis Father   . Diabetes Father   . Hypertension Father   . Goiter Father   . Heart disease Mother   . Depression Mother   . Mental illness Mother   . Anemia Mother   . Hypokalemia Mother   . Diabetes Brother   . Breast cancer Neg Hx     Social History   Socioeconomic History  . Marital status: Single    Spouse name: Not on file  . Number of children: Not on file  . Years of education: Not on file  . Highest education level: Not on file  Occupational History  . Not on file  Tobacco Use  . Smoking status: Never Smoker  . Smokeless tobacco: Never Used  Substance and Sexual Activity  . Alcohol use: Yes    Comment: occasional  . Drug use: No  . Sexual activity: Never    Partners: Male    Comment: given condoms  Other Topics Concern  . Not on file  Social History Narrative  . Not on file   Social Determinants of Health   Financial Resource Strain:   . Difficulty  of Paying Living Expenses: Not on file  Food Insecurity:   . Worried About Programme researcher, broadcasting/film/video in the Last Year: Not on file  . Ran Out of Food in the Last Year: Not on file  Transportation Needs:   . Lack of Transportation (Medical): Not on file  . Lack of Transportation (Non-Medical): Not on file  Physical Activity:   . Days of Exercise per Week: Not on file  . Minutes of Exercise per Session: Not on file  Stress:   . Feeling of Stress : Not on file  Social Connections:   . Frequency of Communication with Friends and Family: Not on file  . Frequency of Social Gatherings with Friends and Family: Not on file  . Attends Religious Services: Not on file  . Active Member of Clubs or Organizations: Not on file  . Attends Banker Meetings: Not on file  . Marital  Status: Not on file  Intimate Partner Violence:   . Fear of Current or Ex-Partner: Not on file  . Emotionally Abused: Not on file  . Physically Abused: Not on file  . Sexually Abused: Not on file    Outpatient Medications Prior to Visit  Medication Sig Dispense Refill  . albuterol (VENTOLIN HFA) 108 (90 Base) MCG/ACT inhaler INHALE 1 PUFF INTO THE LUNGS EVERY 4 HOURS AS NEEDED FOR SHORTNESS OF BREATH 18 g 5  . atenolol (TENORMIN) 25 MG tablet TAKE 1 TABLET(25 MG) BY MOUTH DAILY 90 tablet 0  . ATRIPLA 600-200-300 MG tablet TAKE 1 TABLET BY MOUTH AT BEDTIME 30 tablet 5  . Camphor-Eucalyptus-Menthol (MEDICATED CHEST RUB) 4.73-1.2-2.6 % OINT     . cetirizine (ZYRTEC) 10 MG tablet Take 1 tablet (10 mg total) by mouth at bedtime. 30 tablet 3  . cromolyn (OPTICROM) 4 % ophthalmic solution 1 drop in both eyes 4 times daily as needed. 10 mL 3  . cyclobenzaprine (FLEXERIL) 10 MG tablet TAKE 1 TABLET(10 MG) BY MOUTH THREE TIMES DAILY AS NEEDED FOR MUSCLE SPASMS 30 tablet 0  . FLOVENT HFA 110 MCG/ACT inhaler INHALE 1 PUFF INTO THE LUNGS DAILY AS NEEDED FOR SHORTNESS OF BREATH 12 g 0  . fluticasone (FLONASE) 50 MCG/ACT nasal spray SHAKE LIQUID AND USE 1 SPRAY IN EACH NOSTRIL EVERY DAY 16 g 5  . hydrOXYzine (ATARAX/VISTARIL) 25 MG tablet TAKE 1 TABLET(25 MG) BY MOUTH AT BEDTIME AS NEEDED 90 tablet 1  . meloxicam (MOBIC) 7.5 MG tablet TAKE 1 TABLET(7.5 MG) BY MOUTH DAILY 90 tablet 1  . Olopatadine HCl 0.2 % SOLN Apply 1 drop to eye daily as needed. 1 Bottle 5  . triamcinolone cream (KENALOG) 0.1 % APPLY TOPICALLY TO THE AFFECTED AREA(S) AS DIRECTED TWICE DAILY AS NEEDED 30 g 3  . albuterol (VENTOLIN HFA) 108 (90 Base) MCG/ACT inhaler INHALE 1 PUFF INTO THE LUNGS EVERY 4 HOURS AS NEEDED FOR SHORTNESS OF BREATH 18 g 0  . atenolol (TENORMIN) 25 MG tablet TAKE 1 TABLET(25 MG) BY MOUTH DAILY 90 tablet 0  . fenofibrate (TRICOR) 145 MG tablet Take 1 tablet (145 mg total) by mouth daily. 90 tablet 0  . LORazepam  (ATIVAN) 0.5 MG tablet      No facility-administered medications prior to visit.    No Known Allergies  ROS Review of Systems  Constitutional: Negative.   HENT: Negative.   Eyes: Negative.   Respiratory: Negative.   Cardiovascular: Negative.   Gastrointestinal: Negative.   Endocrine: Negative.   Genitourinary: Negative.   Musculoskeletal:  Negative.   Skin: Negative.   Allergic/Immunologic: Negative.   Neurological: Negative.   Hematological: Negative.   Psychiatric/Behavioral: Negative.       Objective:    Physical Exam  Constitutional: She is oriented to person, place, and time. She appears well-developed and well-nourished.  HENT:  Head: Normocephalic.  Neck:    Cardiovascular: Normal rate, regular rhythm and normal heart sounds.  Pulmonary/Chest: Effort normal and breath sounds normal.  Abdominal: Soft. Bowel sounds are normal.  Musculoskeletal:        General: Normal range of motion.     Cervical back: Normal range of motion.  Neurological: She is alert and oriented to person, place, and time.  Skin: Skin is warm and dry.  Psychiatric: She has a normal mood and affect. Her behavior is normal. Judgment and thought content normal.    BP 128/62 (BP Location: Right Arm, Patient Position: Sitting, Cuff Size: Large)   Pulse 70   Temp 99.1 F (37.3 C) (Oral)   Resp 16   Ht 5\' 5"  (1.651 m)   Wt 220 lb (99.8 kg)   SpO2 99%   BMI 36.61 kg/m  Wt Readings from Last 3 Encounters:  01/26/20 220 lb (99.8 kg)  05/02/19 235 lb 12.8 oz (107 kg)  10/09/18 226 lb (102.5 kg)     There are no preventive care reminders to display for this patient.  There are no preventive care reminders to display for this patient.  Lab Results  Component Value Date   TSH 0.992 04/23/2019   Lab Results  Component Value Date   WBC 7.8 06/11/2019   HGB 12.8 06/11/2019   HCT 37.6 06/11/2019   MCV 93.1 06/11/2019   PLT 316 06/11/2019   Lab Results  Component Value Date   NA  138 06/11/2019   K 4.8 06/11/2019   CO2 26 06/11/2019   GLUCOSE 98 06/11/2019   BUN 17 06/11/2019   CREATININE 0.76 06/11/2019   BILITOT 0.3 06/11/2019   ALKPHOS 89 04/23/2019   AST 22 06/11/2019   ALT 29 06/11/2019   PROT 6.8 06/11/2019   ALBUMIN 4.5 04/23/2019   CALCIUM 9.4 06/11/2019   Lab Results  Component Value Date   CHOL 286 (H) 06/11/2019   Lab Results  Component Value Date   HDL 77 06/11/2019   Lab Results  Component Value Date   LDLCALC 179 (H) 06/11/2019   Lab Results  Component Value Date   TRIG 152 (H) 06/11/2019   Lab Results  Component Value Date   CHOLHDL 3.7 06/11/2019   Lab Results  Component Value Date   HGBA1C 6.0 (A) 01/26/2020      Assessment & Plan:   Problem List Items Addressed This Visit      High   Essential hypertension - Primary   Relevant Orders   Urinalysis Dipstick (Completed)   Hyperlipidemia   Relevant Orders   Lipid panel   Thyromegaly   Relevant Orders   TSH   T3, Free   T4, Free     Medium   Anxiety state   Relevant Medications   LORazepam (ATIVAN) 0.5 MG tablet     Low   Healthcare maintenance   Relevant Orders   Comp. Metabolic Panel (12)   Magnesium    Other Visit Diagnoses    Body mass index (BMI) of 36.0-36.9 in adult       Continue with lifestyle modification   Type 2 diabetes mellitus without complication, without long-term current use of  insulin (HCC)       Continue with lifestyle modification and weight loss   Relevant Orders   HgB A1c (Completed)      Meds ordered this encounter  Medications  . LORazepam (ATIVAN) 0.5 MG tablet    Sig: Take 1 tablet (0.5 mg total) by mouth as needed for anxiety.    Dispense:  15 tablet    Refill:  0    Order Specific Question:   Supervising Provider    Answer:   Quentin Angst [id number]    Follow-up: Return in about 3 months (around 04/27/2020).    Barbette Merino, NP

## 2020-01-26 NOTE — Patient Instructions (Addendum)
Thyroid Nodule  A thyroid nodule is an isolated growth of thyroid cells that forms a lump in your thyroid gland. The thyroid gland is a butterfly-shaped gland. It is found in the lower front of your neck. This gland sends chemical messengers (hormones) through your blood to all parts of your body. These hormones are important in regulating your body temperature and helping your body to use energy. Thyroid nodules are common. Most are not cancerous (benign). You may have one nodule or several nodules. Different types of thyroid nodules include nodules that:  Grow and fill with fluid (thyroid cysts).  Produce too much thyroid hormone (hot nodules or hyperthyroid).  Produce no thyroid hormone (cold nodules or hypothyroid).  Form from cancer cells (thyroid cancers). What are the causes? In most cases, the cause of this condition is not known. What increases the risk? The following factors may make you more likely to develop this condition.  Age. Thyroid nodules become more common in people who are older than 57 years of age.  Gender. ? Benign thyroid nodules are more common in women. ? Cancerous (malignant) thyroid nodules are more common in men.  A family history that includes: ? Thyroid nodules. ? Pheochromocytoma. ? Thyroid carcinoma. ? Hyperparathyroidism.  Certain kinds of thyroid diseases, such as Hashimoto's thyroiditis.  Lack of iodine in your diet.  A history of head and neck radiation, such as from previous cancer treatment. What are the signs or symptoms? In many cases, there are no symptoms. If you have symptoms, they may include:  A lump in your lower neck.  Feeling a lump or tickle in your throat.  Pain in your neck, jaw, or ear.  Having trouble swallowing. Hot nodules may cause symptoms that include:  Weight loss.  Warm, flushed skin.  Feeling hot.  Feeling nervous.  A racing heartbeat. Cold nodules may cause symptoms that include:  Weight  gain.  Dry skin.  Brittle hair. This may also occur with hair loss.  Feeling cold.  Fatigue. Thyroid cancer nodules may cause symptoms that include:  Hard nodules that feel stuck to the thyroid gland.  Hoarseness.  Lumps in the glands near your thyroid (lymph nodes). How is this diagnosed? A thyroid nodule may be felt by your health care provider during a physical exam. This condition may also be diagnosed based on your symptoms. You may also have tests, including:  An ultrasound. This may be done to confirm the diagnosis.  A biopsy. This involves taking a sample from the nodule and looking at it under a microscope.  Blood tests to make sure that your thyroid is working properly.  A thyroid scan. This test uses a radioactive tracer injected into a vein to create an image of the thyroid gland on a computer screen.  Imaging tests such as MRI or CT scan. These may be done if: ? Your nodule is large. ? Your nodule is blocking your airway. ? Cancer is suspected. How is this treated? Treatment depends on the cause and size of your nodule or nodules. If the nodule is benign, treatment may not be necessary. Your health care provider may monitor the nodule to see if it goes away without treatment. If the nodule continues to grow, is cancerous, or does not go away, treatment may be needed. Treatment may include:  Having a cystic nodule drained with a needle.  Ablation therapy. In this treatment, alcohol is injected into the area of the nodule to destroy the cells. Ablation with heat (  thermal ablation) may also be used.  Radioactive iodine. In this treatment, radioactive iodine is given as a pill or liquid that you drink. This substance causes the thyroid nodule to shrink.  Surgery to remove the nodule. Part or all of your thyroid gland may need to be removed as well.  Medicines. Follow these instructions at home:  Pay attention to any changes in your nodule.  Take  over-the-counter and prescription medicines only as told by your health care provider.  Keep all follow-up visits as told by your health care provider. This is important. Contact a health care provider if:  Your voice changes.  You have trouble swallowing.  You have pain in your neck, ear, or jaw that is getting worse.  Your nodule gets bigger.  Your nodule starts to make it harder for you to breathe.  Your muscles look like they are shrinking (muscle wasting). Get help right away if:  You have chest pain.  There is a loss of consciousness.  You have a sudden fever.  You feel confused.  You are seeing or hearing things that other people do not see or hear (having hallucinations).  You feel very weak.  You have mood swings.  You feel very restless.  You feel suddenly nauseous or throw up.  You suddenly have diarrhea. Summary  A thyroid nodule is an isolated growth of thyroid cells that forms a lump in your thyroid gland.  Thyroid nodules are common. Most are not cancerous (benign). You may have one nodule or several nodules.  Treatment depends on the cause and size of your nodule or nodules. If the nodule is benign, treatment may not be necessary.  Your health care provider may monitor the nodule to see if it goes away without treatment. If the nodule continues to grow, is cancerous, or does not go away, treatment may be needed. This information is not intended to replace advice given to you by your health care provider. Make sure you discuss any questions you have with your health care provider. Document Revised: 06/28/2018 Document Reviewed: 07/01/2018 Elsevier Patient Education  2020 [place] Following a healthy eating pattern may help you to achieve and maintain a healthy body weight, reduce the risk of chronic disease, and live a long and productive life. It is important to follow a healthy eating pattern at an appropriate calorie level for  your body. Your nutritional needs should be met primarily through food by choosing a variety of nutrient-rich foods. What are tips for following this plan? Reading food labels Read labels and choose the following: Reduced or low sodium. Juices with 100% fruit juice. Foods with low saturated fats and high polyunsaturated and monounsaturated fats. Foods with whole grains, such as whole wheat, cracked wheat, brown rice, and wild rice. Whole grains that are fortified with folic acid. This is recommended for women who are pregnant or who want to become pregnant. Read labels and avoid the following: Foods with a lot of added sugars. These include foods that contain brown sugar, corn sweetener, corn syrup, dextrose, fructose, glucose, high-fructose corn syrup, honey, invert sugar, lactose, malt syrup, maltose, molasses, raw sugar, sucrose, trehalose, or turbinado sugar. Do not eat more than the following amounts of added sugar per day: 6 teaspoons (25 g) for women. 9 teaspoons (38 g) for men. Foods that contain processed or refined starches and grains. Refined grain products, such as white flour, degermed cornmeal, white bread, and white rice. Shopping Choose nutrient-rich snacks, such  as vegetables, whole fruits, and nuts. Avoid high-calorie and high-sugar snacks, such as potato chips, fruit snacks, and candy. Use oil-based dressings and spreads on foods instead of solid fats such as butter, stick margarine, or cream cheese. Limit pre-made sauces, mixes, and "instant" products such as flavored rice, instant noodles, and ready-made pasta. Try more plant-protein sources, such as tofu, tempeh, black beans, edamame, lentils, nuts, and seeds. Explore eating plans such as the Mediterranean diet or vegetarian diet. Cooking Use oil to saut or stir-fry foods instead of solid fats such as butter, stick margarine, or lard. Try baking, boiling, grilling, or broiling instead of frying. Remove the fatty part  of meats before cooking. Steam vegetables in water or broth. Meal planning  At meals, imagine dividing your plate into fourths: One-half of your plate is fruits and vegetables. One-fourth of your plate is whole grains. One-fourth of your plate is protein, especially lean meats, poultry, eggs, tofu, beans, or nuts. Include low-fat dairy as part of your daily diet. Lifestyle Choose healthy options in all settings, including home, work, school, restaurants, or stores. Prepare your food safely: Wash your hands after handling raw meats. Keep food preparation surfaces clean by regularly washing with hot, soapy water. Keep raw meats separate from ready-to-eat foods, such as fruits and vegetables. Cook seafood, meat, poultry, and eggs to the recommended internal temperature. Store foods at safe temperatures. In general: Keep cold foods at 41F (4.4C) or below. Keep hot foods at 141F (60C) or above. Keep your freezer at [facility] (-17.8C) or below. Foods are no longer safe to eat when they have been between the temperatures of 40-141F (4.4-60C) for more than 2 hours. What foods should I eat? Fruits Aim to eat 2 cup-equivalents of fresh, canned (in natural juice), or frozen fruits each day. Examples of 1 cup-equivalent of fruit include 1 small apple, 8 large strawberries, 1 cup canned fruit,  cup dried fruit, or 1 cup 100% juice. Vegetables Aim to eat 2-3 cup-equivalents of fresh and frozen vegetables each day, including different varieties and colors. Examples of 1 cup-equivalent of vegetables include 2 medium carrots, 2 cups raw, leafy greens, 1 cup chopped vegetable (raw or cooked), or 1 medium baked potato. Grains Aim to eat 6 ounce-equivalents of whole grains each day. Examples of 1 ounce-equivalent of grains include 1 slice of bread, 1 cup ready-to-eat cereal, 3 cups popcorn, or  cup cooked rice, pasta, or cereal. Meats and other proteins Aim to eat 5-6 ounce-equivalents of protein each  day. Examples of 1 ounce-equivalent of protein include 1 egg, 1/2 cup nuts or seeds, or 1 tablespoon (16 g) peanut butter. A cut of meat or fish that is the size of a deck of cards is about 3-4 ounce-equivalents. Of the protein you eat each week, try to have at least 8 ounces come from seafood. This includes salmon, trout, herring, and anchovies. Dairy Aim to eat 3 cup-equivalents of fat-free or low-fat dairy each day. Examples of 1 cup-equivalent of dairy include 1 cup (240 mL) milk, 8 ounces (250 g) yogurt, 1 ounces (44 g) natural cheese, or 1 cup (240 mL) fortified soy milk. Fats and oils Aim for about 5 teaspoons (21 g) per day. Choose monounsaturated fats, such as canola and olive oils, avocados, peanut butter, and most nuts, or polyunsaturated fats, such as sunflower, corn, and soybean oils, walnuts, pine nuts, sesame seeds, sunflower seeds, and flaxseed. Beverages Aim for six 8-oz glasses of water per day. Limit coffee to three to five  8-oz cups per day. Limit caffeinated beverages that have added calories, such as soda and energy drinks. Limit alcohol intake to no more than 1 drink a day for nonpregnant women and 2 drinks a day for men. One drink equals 12 oz of beer (355 mL), 5 oz of wine (148 mL), or 1 oz of hard liquor (44 mL). Seasoning and other foods Avoid adding excess amounts of salt to your foods. Try flavoring foods with herbs and spices instead of salt. Avoid adding sugar to foods. Try using oil-based dressings, sauces, and spreads instead of solid fats. This information is based on general [place] nutrition guidelines. For more information, visit [employer]. Exact amounts may vary based on your nutrition needs. Summary A healthy eating plan may help you to maintain a healthy weight, reduce the risk of chronic diseases, and stay active throughout your life. Plan your meals. Make sure you eat the right portions of a variety of nutrient-rich foods. Try baking, boiling,  grilling, or broiling instead of frying. Choose healthy options in all settings, including home, work, school, restaurants, or stores. This information is not intended to replace advice given to you by your health care provider. Make sure you discuss any questions you have with your health care provider. Document Revised: 02/25/2018 Document Reviewed: 02/25/2018 Elsevier Patient Education  [place].

## 2020-01-27 LAB — COMP. METABOLIC PANEL (12)
AST: 25 IU/L (ref 0–40)
Albumin/Globulin Ratio: 1.8 (ref 1.2–2.2)
Albumin: 4.1 g/dL (ref 3.8–4.9)
Alkaline Phosphatase: 90 IU/L (ref 39–117)
BUN/Creatinine Ratio: 23 (ref 9–23)
BUN: 16 mg/dL (ref 6–24)
Bilirubin Total: <0.2 mg/dL (ref 0.0–1.2)
Calcium: 9.4 mg/dL (ref 8.7–10.2)
Chloride: 103 mmol/L (ref 96–106)
Creatinine, Ser: 0.69 mg/dL (ref 0.57–1.00)
GFR calc Af Amer: 112 mL/min/{1.73_m2} (ref >59)
GFR calc non Af Amer: 97 mL/min/{1.73_m2} (ref >59)
Globulin, Total: 2.3 g/dL (ref 1.5–4.5)
Glucose: 97 mg/dL (ref 65–99)
Potassium: 4.3 mmol/L (ref 3.5–5.2)
Sodium: 138 mmol/L (ref 134–144)
Total Protein: 6.4 g/dL (ref 6.0–8.5)

## 2020-01-27 LAB — LIPID PANEL
Chol/HDL Ratio: 3.4 ratio (ref 0.0–4.4)
Cholesterol, Total: 260 mg/dL — ABNORMAL HIGH (ref 100–199)
HDL: 76 mg/dL (ref >39)
LDL Chol Calc (NIH): 167 mg/dL — ABNORMAL HIGH (ref 0–99)
Triglycerides: 100 mg/dL (ref 0–149)
VLDL Cholesterol Cal: 17 mg/dL (ref 5–40)

## 2020-01-27 LAB — T4, FREE: Free T4: 0.85 ng/dL (ref 0.82–1.77)

## 2020-01-27 LAB — T3, FREE: T3, Free: 2.9 pg/mL (ref 2.0–4.4)

## 2020-01-27 LAB — TSH: TSH: 0.592 u[IU]/mL (ref 0.450–4.500)

## 2020-02-16 ENCOUNTER — Encounter: Payer: Self-pay | Admitting: Nurse Practitioner

## 2020-02-16 LAB — HM DIABETES EYE EXAM

## 2020-02-19 ENCOUNTER — Other Ambulatory Visit: Payer: Self-pay

## 2020-02-19 DIAGNOSIS — M545 Low back pain, unspecified: Secondary | ICD-10-CM

## 2020-02-19 MED ORDER — CYCLOBENZAPRINE HCL 10 MG PO TABS: ORAL_TABLET | ORAL | 3 refills | Status: DC

## 2020-03-08 ENCOUNTER — Other Ambulatory Visit: Payer: Self-pay

## 2020-03-08 ENCOUNTER — Other Ambulatory Visit (HOSPITAL_COMMUNITY)
Admission: RE | Admit: 2020-03-08 | Discharge: 2020-03-08 | Disposition: A | Discharge status: Home / Self Care | Payer: Self-pay | Source: Ambulatory Visit | Attending: Infectious Diseases | Admitting: Infectious Diseases

## 2020-03-08 DIAGNOSIS — Z113 Encounter for screening for infections with a predominantly sexual mode of transmission: Secondary | ICD-10-CM

## 2020-03-08 DIAGNOSIS — B20 Human immunodeficiency virus [HIV] disease: Secondary | ICD-10-CM | POA: Diagnosis not present

## 2020-03-08 NOTE — Addendum Note (Signed)
Addended byJimmy Picket F on: 03/08/2020 10:53 AM   Modules accepted: Orders

## 2020-03-09 LAB — URINE CYTOLOGY ANCILLARY ONLY
Chlamydia: NEGATIVE
Comment: NEGATIVE
Comment: NORMAL
Neisseria Gonorrhea: NEGATIVE

## 2020-03-09 LAB — T-HELPER CELL (CD4) - (RCID CLINIC ONLY)
CD4 % Helper T Cell: 48 % (ref 33–65)
CD4 T Cell Abs: 1605 /uL (ref 400–1790)

## 2020-03-10 ENCOUNTER — Other Ambulatory Visit: Payer: Medicare Other

## 2020-03-10 LAB — RPR: RPR Ser Ql: NONREACTIVE

## 2020-03-10 LAB — CBC
HCT: 38.3 % (ref 35.0–45.0)
Hemoglobin: 13 g/dL (ref 11.7–15.5)
MCH: 31.9 pg (ref 27.0–33.0)
MCHC: 33.9 g/dL (ref 32.0–36.0)
MCV: 93.9 fL (ref 80.0–100.0)
MPV: 10 fL (ref 7.5–12.5)
Platelets: 317 10*3/uL (ref 140–400)
RBC: 4.08 10*6/uL (ref 3.80–5.10)
RDW: 11.9 % (ref 11.0–15.0)
WBC: 6.7 10*3/uL (ref 3.8–10.8)

## 2020-03-10 LAB — COMPREHENSIVE METABOLIC PANEL
AG Ratio: 1.5 (calc) (ref 1.0–2.5)
ALT: 27 U/L (ref 6–29)
AST: 22 U/L (ref 10–35)
Albumin: 4.1 g/dL (ref 3.6–5.1)
Alkaline phosphatase (APISO): 79 U/L (ref 37–153)
BUN: 12 mg/dL (ref 7–25)
CO2: 25 mmol/L (ref 20–32)
Calcium: 9.7 mg/dL (ref 8.6–10.4)
Chloride: 104 mmol/L (ref 98–110)
Creat: 0.59 mg/dL (ref 0.50–1.05)
Globulin: 2.7 g/dL (calc) (ref 1.9–3.7)
Glucose, Bld: 94 mg/dL (ref 65–99)
Potassium: 4.8 mmol/L (ref 3.5–5.3)
Sodium: 137 mmol/L (ref 135–146)
Total Bilirubin: 0.2 mg/dL (ref 0.2–1.2)
Total Protein: 6.8 g/dL (ref 6.1–8.1)

## 2020-03-10 LAB — HIV-1 RNA QUANT-NO REFLEX-BLD
HIV 1 RNA Quant: <20 copies/mL
HIV-1 RNA Quant, Log: <1.3 Log copies/mL

## 2020-03-14 IMAGING — US US THYROID
1 series · 12 of 25 positions shown · non-contrast
Comparison: Prior thyroid ultrasound 08/27/2018 and 02/13/2017

CLINICAL DATA: Prior ultrasound follow-up. 58-year-old female with
multinodular thyroid goiter. The right superior and left mid thyroid
nodules are currently under annual surveillance.

EXAM:
THYROID ULTRASOUND
TECHNIQUE: Ultrasound examination of the thyroid gland and adjacent soft
tissues was performed.

[Series 1: us thyroid · 0.06mm/px · 12 of 81 slices shown]
[im 4/81]
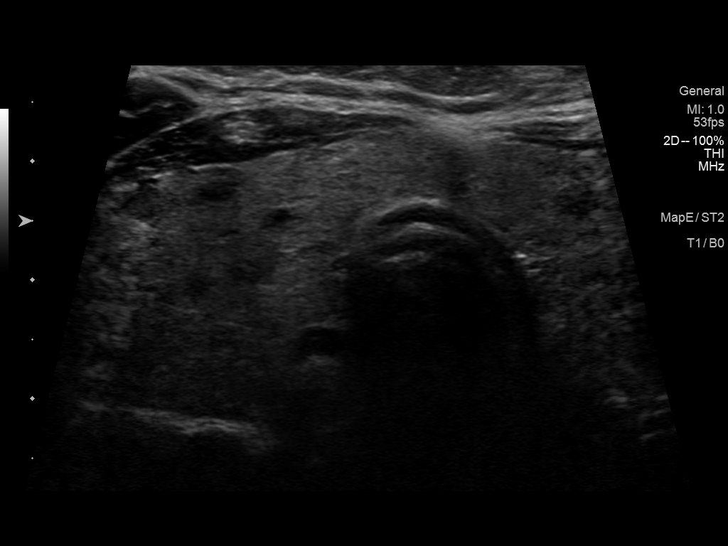
[im 11/81]
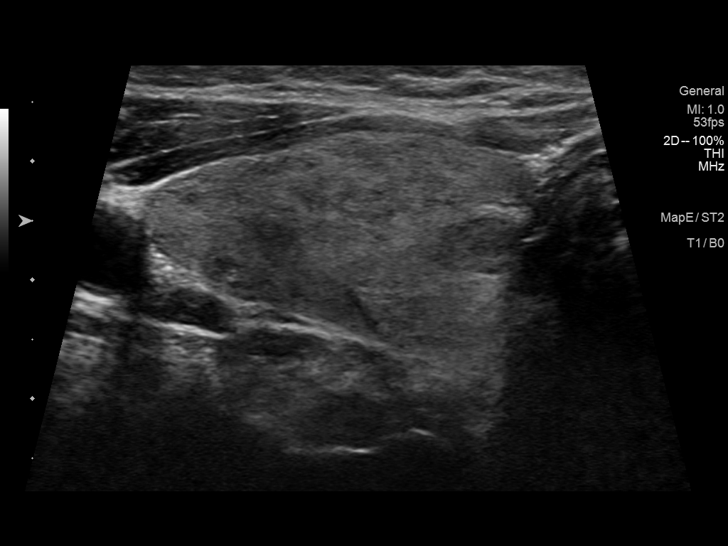
[im 17/81]
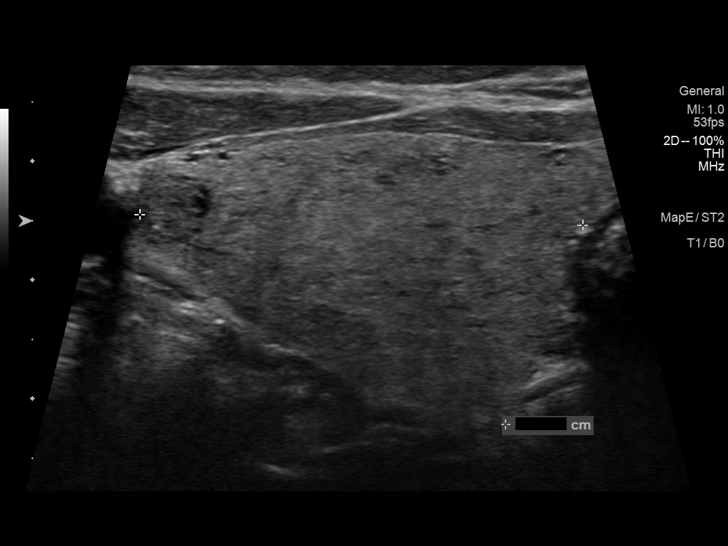
[im 24/81]
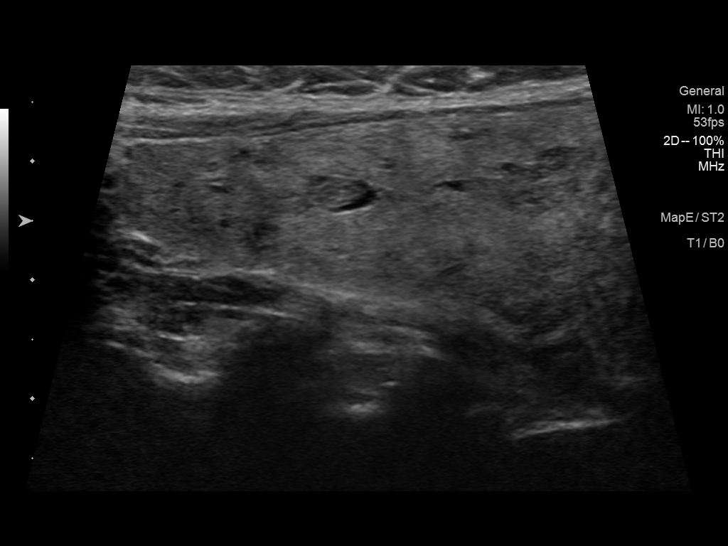
[im 31/81]
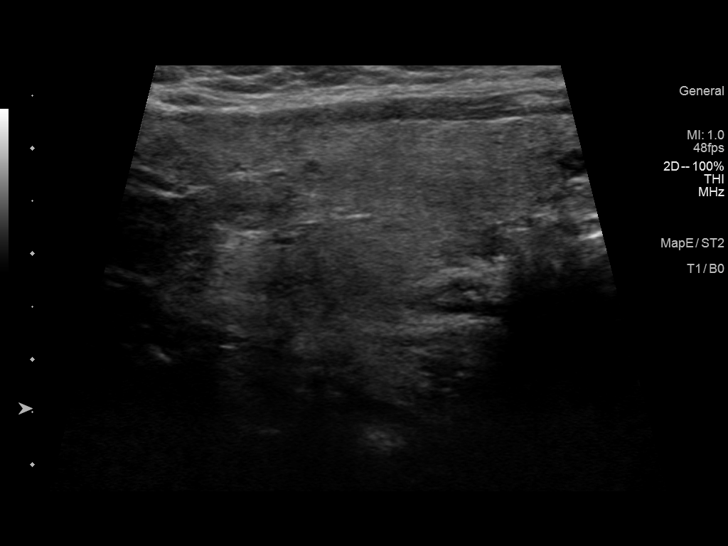
[im 37/81]
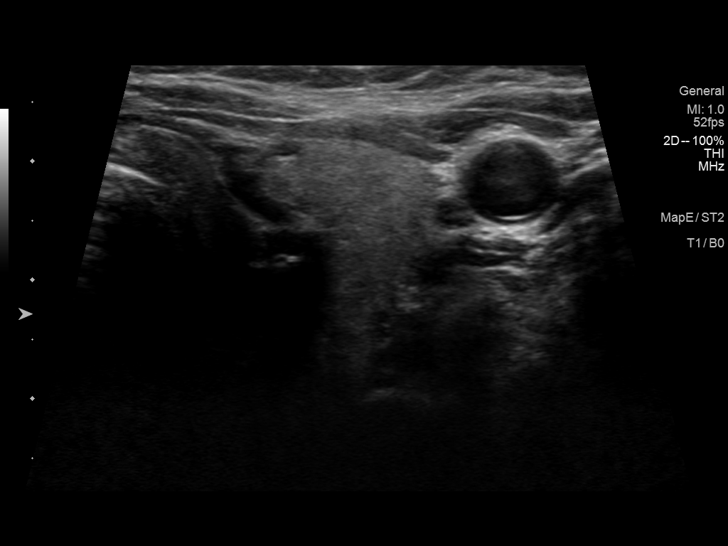
[im 44/81]
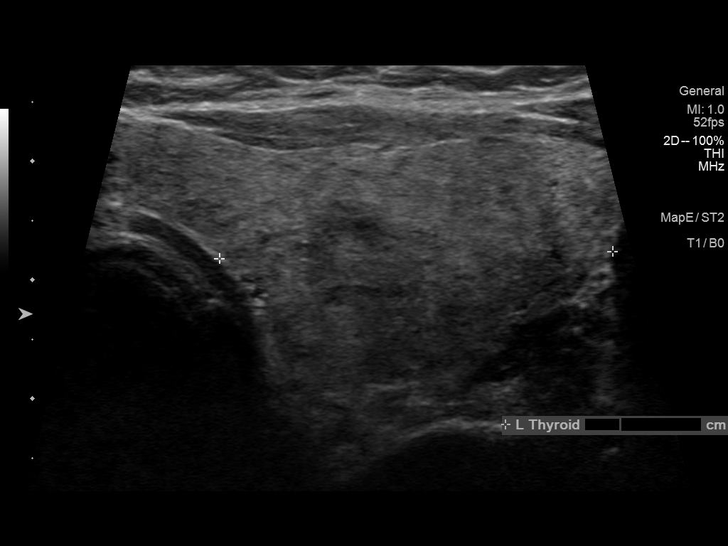
[im 51/81]
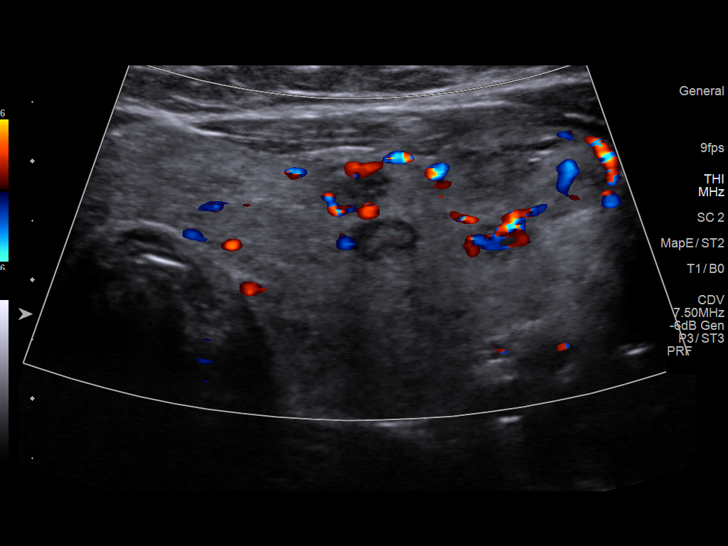
[im 57/81]
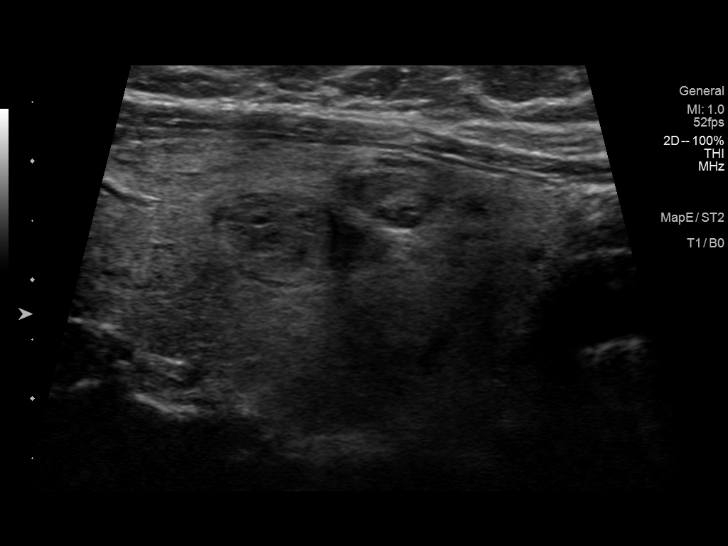
[im 64/81]
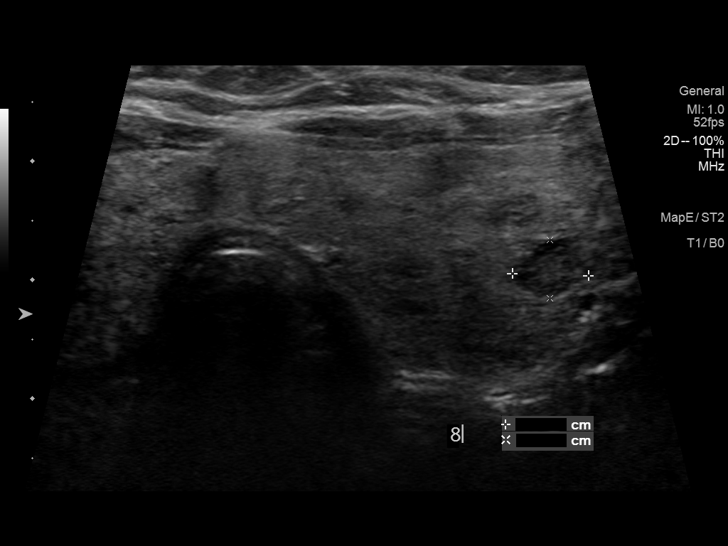
[im 71/81]
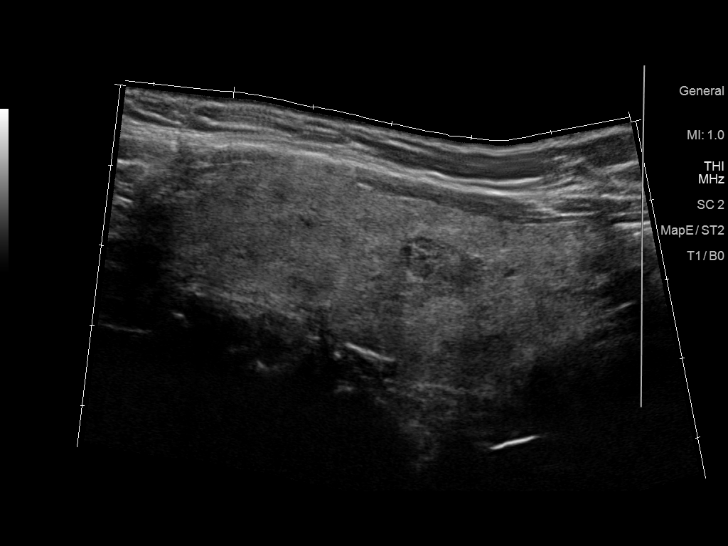
[im 77/81]
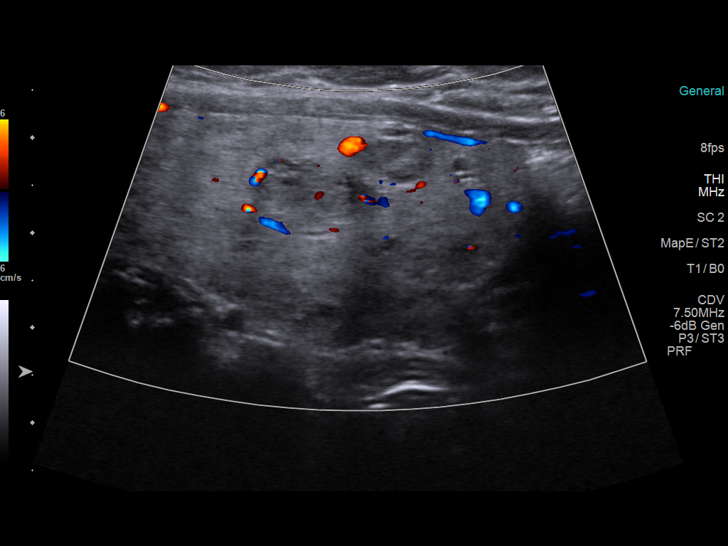

[12 of 25 positions shown; findings below may reference images not displayed]

FINDINGS: Parenchymal Echotexture: Moderately heterogenous

Isthmus: 0.7 cm

Right lobe: 6.8 x 2.6 x 3.7 cm

Left lobe: 6.0 x 2.4 x 3.3 cm

_________________________________________________________

Estimated total number of nodules >/= 1 cm: 3

Number of spongiform nodules >/=  2 cm not described below (TR1): 0

Number of mixed cystic and solid nodules >/= 1.5 cm not described
below (TR2): 0

_________________________________________________________

Nodule # 1:

Prior biopsy: No

Location: Right; Superior

Maximum size: 0.9 cm; Other 2 dimensions: 0.8 x 0.6 cm, previously,
1.0 x 0.9 x 0.7 cm

Composition: solid/almost completely solid (2)

Echogenicity: hypoechoic (2)

Shape: not taller-than-wide (0)

Margins: smooth (0)

Echogenic foci: none (0)

ACR TI-RADS total points: 4.

ACR TI-RADS risk category:  TR4 (4-6 points).

Significant change in size (>/= 20% in two dimensions and minimal
increase of 2 mm): No

Change in features: No

Change in ACR TI-RADS risk category: No

ACR TI-RADS recommendations:

Slight interval involution of TI-RADS category 4 nodule which no
longer meets size criteria for annual surveillance.

_________________________________________________________

Nodule # 5:

Prior biopsy: No

Location: Left; Mid

Maximum size: 1.8 cm; Other 2 dimensions: 1.4 x 0.7 cm, previously,
1.8 x 1.5 x 0.9 cm

Composition: solid/almost completely solid (2)

Echogenicity: isoechoic (1)

Shape: not taller-than-wide (0)

Margins: smooth (0)

Echogenic foci: none (0)

ACR TI-RADS total points: 3.

ACR TI-RADS risk category:  TR3 (3 points).

Significant change in size (>/= 20% in two dimensions and minimal
increase of 2 mm): No

Change in features: No

Change in ACR TI-RADS risk category: No

ACR TI-RADS recommendations:

*Given size (>/= 1.5 - 2.4 cm) and appearance, a follow-up
ultrasound in 1 year should be considered based on TI-RADS criteria.

_________________________________________________________

Multiple additional bilateral thyroid nodules, many of which are
spongiform or mixed cystic and solid in configuration are noted.
None of these nodules demonstrate suspicious features or size to
meet criteria for biopsy or dedicated imaging surveillance. No
significant interval change compared to prior.
IMPRESSION: 1. Interval involution of the TI-RADS category 4 nodule (labeled #
1) in the right superior gland. This lesion no longer meets size
criteria for continued surveillance and can be considered benign.
2. No significant interval change in the size or appearance of the
TI-RADS category 3 nodule (labeled # 5) in the left mid gland.
Recommend continued annual surveillance until 5 year stability has
been confirmed.

The above is in keeping with the ACR TI-RADS recommendations - [HOSPITAL] 2010;[DATE].

## 2020-03-19 ENCOUNTER — Telehealth: Payer: Self-pay | Admitting: Nurse Practitioner

## 2020-03-19 ENCOUNTER — Other Ambulatory Visit: Payer: Self-pay | Admitting: Nurse Practitioner

## 2020-03-19 DIAGNOSIS — F411 Generalized anxiety disorder: Secondary | ICD-10-CM

## 2020-03-19 DIAGNOSIS — J4520 Mild intermittent asthma, uncomplicated: Secondary | ICD-10-CM

## 2020-03-19 MED ORDER — FLOVENT HFA 110 MCG/ACT IN AERO: 1.0000 | INHALATION_SPRAY | Freq: Every day | RESPIRATORY_TRACT | 0 refills | Status: DC | PRN

## 2020-03-19 NOTE — Telephone Encounter (Signed)
Refill sent.

## 2020-03-22 ENCOUNTER — Other Ambulatory Visit: Payer: Self-pay | Admitting: Nurse Practitioner

## 2020-03-22 DIAGNOSIS — F411 Generalized anxiety disorder: Secondary | ICD-10-CM

## 2020-03-22 MED ORDER — LORAZEPAM 0.5 MG PO TABS: 0.2500 mg | ORAL_TABLET | Freq: Two times a day (BID) | ORAL | 0 refills | Status: DC | PRN

## 2020-03-22 NOTE — Telephone Encounter (Signed)
Pt is requesting a refill is this okay to refill ?  

## 2020-03-22 NOTE — Telephone Encounter (Signed)
Yes it was sent. Please see the above note.

## 2020-03-22 NOTE — Telephone Encounter (Signed)
Please make the patient aware that I will refill it this time however this is not something that I routinely prescribe.  If she needs this ongoing she will have to be seen by a psychiatrist.

## 2020-03-24 ENCOUNTER — Other Ambulatory Visit: Payer: Self-pay | Admitting: Family Medicine

## 2020-03-25 NOTE — Telephone Encounter (Signed)
Patient requesting refill of Triamcinolone 0.1% cream. Please advise.

## 2020-03-30 ENCOUNTER — Encounter: Payer: Self-pay | Admitting: Infectious Diseases

## 2020-03-30 ENCOUNTER — Ambulatory Visit (INDEPENDENT_AMBULATORY_CARE_PROVIDER_SITE_OTHER): Payer: Self-pay | Admitting: Infectious Diseases

## 2020-03-30 ENCOUNTER — Other Ambulatory Visit: Payer: Self-pay

## 2020-03-30 VITALS — BP 114/76 | HR 67 | Wt 220.0 lb

## 2020-03-30 DIAGNOSIS — F3289 Other specified depressive episodes: Secondary | ICD-10-CM

## 2020-03-30 DIAGNOSIS — Z79899 Other long term (current) drug therapy: Secondary | ICD-10-CM

## 2020-03-30 DIAGNOSIS — E010 Iodine-deficiency related diffuse (endemic) goiter: Secondary | ICD-10-CM

## 2020-03-30 DIAGNOSIS — I10 Essential (primary) hypertension: Secondary | ICD-10-CM

## 2020-03-30 DIAGNOSIS — R739 Hyperglycemia, unspecified: Secondary | ICD-10-CM

## 2020-03-30 DIAGNOSIS — B20 Human immunodeficiency virus [HIV] disease: Secondary | ICD-10-CM

## 2020-03-30 DIAGNOSIS — Z124 Encounter for screening for malignant neoplasm of cervix: Secondary | ICD-10-CM

## 2020-03-30 DIAGNOSIS — E669 Obesity, unspecified: Secondary | ICD-10-CM

## 2020-03-30 DIAGNOSIS — Z113 Encounter for screening for infections with a predominantly sexual mode of transmission: Secondary | ICD-10-CM

## 2020-03-30 NOTE — Assessment & Plan Note (Signed)
She appears to be doing well.  Will defer her BZD to her PCP.

## 2020-03-30 NOTE — Assessment & Plan Note (Signed)
Encouraged her to continue to lose wt.  She is seeing the benefits of this.

## 2020-03-30 NOTE — Assessment & Plan Note (Signed)
Has improved with wt loss.  Appreciate PCP f/u.
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
## 2020-03-30 NOTE — Assessment & Plan Note (Signed)
apprecaite NP Dixon Repeat in 2023

## 2020-03-30 NOTE — Assessment & Plan Note (Signed)
Well controlled,  Appreciate PCP f/u.

## 2020-03-30 NOTE — Assessment & Plan Note (Signed)
Appreciate endo f/u.  

## 2020-03-30 NOTE — Assessment & Plan Note (Signed)
Doing well Has gotten covid Encouraged her to get shingles vax.  Offered/refused condoms.  rtc in 9 months.

## 2020-04-23 ENCOUNTER — Other Ambulatory Visit: Payer: Self-pay | Admitting: Infectious Diseases

## 2020-04-23 DIAGNOSIS — B20 Human immunodeficiency virus [HIV] disease: Secondary | ICD-10-CM

## 2020-04-27 ENCOUNTER — Other Ambulatory Visit: Payer: Self-pay

## 2020-04-27 DIAGNOSIS — J4520 Mild intermittent asthma, uncomplicated: Secondary | ICD-10-CM

## 2020-04-27 MED ORDER — FLOVENT HFA 110 MCG/ACT IN AERO: 1.0000 | INHALATION_SPRAY | Freq: Every day | RESPIRATORY_TRACT | 0 refills | Status: DC | PRN

## 2020-04-29 ENCOUNTER — Other Ambulatory Visit: Payer: Self-pay

## 2020-04-29 DIAGNOSIS — J4520 Mild intermittent asthma, uncomplicated: Secondary | ICD-10-CM

## 2020-04-29 DIAGNOSIS — I10 Essential (primary) hypertension: Secondary | ICD-10-CM

## 2020-04-30 ENCOUNTER — Other Ambulatory Visit: Payer: Self-pay

## 2020-04-30 DIAGNOSIS — H1010 Acute atopic conjunctivitis, unspecified eye: Secondary | ICD-10-CM

## 2020-04-30 MED ORDER — FLOVENT HFA 110 MCG/ACT IN AERO: 1.0000 | INHALATION_SPRAY | Freq: Every day | RESPIRATORY_TRACT | 0 refills | Status: DC | PRN

## 2020-04-30 NOTE — Telephone Encounter (Signed)
Patient has up coming appt with you ,however as no showed to last couple appointments is this okay to refill?

## 2020-05-01 MED ORDER — CROMOLYN SODIUM 4 % OP SOLN: OPHTHALMIC | 3 refills | Status: DC

## 2020-05-03 ENCOUNTER — Ambulatory Visit: Payer: Self-pay | Admitting: Nurse Practitioner

## 2020-05-05 ENCOUNTER — Other Ambulatory Visit: Payer: Self-pay | Admitting: Nurse Practitioner

## 2020-05-05 DIAGNOSIS — F411 Generalized anxiety disorder: Secondary | ICD-10-CM

## 2020-05-06 ENCOUNTER — Other Ambulatory Visit: Payer: Self-pay | Admitting: Nurse Practitioner

## 2020-05-25 ENCOUNTER — Telehealth: Payer: Self-pay | Admitting: Nurse Practitioner

## 2020-05-25 ENCOUNTER — Other Ambulatory Visit: Payer: Self-pay

## 2020-05-25 ENCOUNTER — Other Ambulatory Visit: Payer: Self-pay | Admitting: Family Medicine

## 2020-05-25 DIAGNOSIS — I10 Essential (primary) hypertension: Secondary | ICD-10-CM

## 2020-05-25 DIAGNOSIS — J4520 Mild intermittent asthma, uncomplicated: Secondary | ICD-10-CM

## 2020-05-25 MED ORDER — FLOVENT HFA 110 MCG/ACT IN AERO: 2.0000 | INHALATION_SPRAY | Freq: Every day | RESPIRATORY_TRACT | 0 refills | Status: DC | PRN

## 2020-05-25 NOTE — Progress Notes (Signed)
Meds ordered this encounter  Medications  . fluticasone (FLOVENT HFA) 110 MCG/ACT inhaler    Sig: Inhale 2 puffs into the lungs daily as needed.    Dispense:  12 g    Refill:  0    Order Specific Question:   Supervising Provider    Answer:   Quentin Angst [[id number]]     Nolon Nations  APRN, MSN, FNP-C Patient Care [facility] [address] [place], [place] [address] [phone]

## 2020-05-25 NOTE — Telephone Encounter (Signed)
Pharmacy called wanting to see if the dosage can be increased to 2 pumps (which the pt reported they have been using)

## 2020-06-01 ENCOUNTER — Other Ambulatory Visit: Payer: Self-pay | Admitting: Nurse Practitioner

## 2020-06-01 DIAGNOSIS — J4520 Mild intermittent asthma, uncomplicated: Secondary | ICD-10-CM

## 2020-06-01 DIAGNOSIS — F411 Generalized anxiety disorder: Secondary | ICD-10-CM

## 2020-06-01 DIAGNOSIS — I10 Essential (primary) hypertension: Secondary | ICD-10-CM

## 2020-06-01 MED ORDER — ALBUTEROL SULFATE HFA 108 (90 BASE) MCG/ACT IN AERS: INHALATION_SPRAY | RESPIRATORY_TRACT | 5 refills | Status: DC

## 2020-06-01 MED ORDER — FLOVENT HFA 110 MCG/ACT IN AERO: 2.0000 | INHALATION_SPRAY | Freq: Every day | RESPIRATORY_TRACT | 3 refills | Status: DC | PRN

## 2020-06-01 MED ORDER — ATENOLOL 25 MG PO TABS: ORAL_TABLET | ORAL | 0 refills | Status: DC

## 2020-06-02 ENCOUNTER — Other Ambulatory Visit: Payer: Self-pay | Admitting: Nurse Practitioner

## 2020-06-02 DIAGNOSIS — F411 Generalized anxiety disorder: Secondary | ICD-10-CM

## 2020-06-10 ENCOUNTER — Other Ambulatory Visit: Payer: Self-pay | Admitting: Nurse Practitioner

## 2020-06-10 DIAGNOSIS — F411 Generalized anxiety disorder: Secondary | ICD-10-CM

## 2020-06-14 ENCOUNTER — Ambulatory Visit: Payer: Self-pay

## 2020-06-14 NOTE — Telephone Encounter (Signed)
Refill request for lorazepam. Please advise.

## 2020-06-15 ENCOUNTER — Other Ambulatory Visit: Payer: Self-pay

## 2020-06-15 DIAGNOSIS — F411 Generalized anxiety disorder: Secondary | ICD-10-CM

## 2020-06-21 ENCOUNTER — Other Ambulatory Visit: Payer: Self-pay

## 2020-06-21 ENCOUNTER — Ambulatory Visit: Payer: Self-pay

## 2020-06-22 ENCOUNTER — Other Ambulatory Visit: Payer: Self-pay | Admitting: Nurse Practitioner

## 2020-06-22 ENCOUNTER — Other Ambulatory Visit: Payer: Self-pay | Admitting: Internal Medicine

## 2020-06-22 DIAGNOSIS — M545 Low back pain, unspecified: Secondary | ICD-10-CM

## 2020-06-22 DIAGNOSIS — J4520 Mild intermittent asthma, uncomplicated: Secondary | ICD-10-CM

## 2020-07-05 ENCOUNTER — Encounter: Payer: Self-pay | Admitting: Infectious Diseases

## 2020-07-21 ENCOUNTER — Other Ambulatory Visit: Payer: Self-pay | Admitting: Nurse Practitioner

## 2020-07-21 DIAGNOSIS — F411 Generalized anxiety disorder: Secondary | ICD-10-CM

## 2020-07-29 ENCOUNTER — Telehealth: Payer: Self-pay | Admitting: Nurse Practitioner

## 2020-07-29 ENCOUNTER — Other Ambulatory Visit: Payer: Self-pay | Admitting: Nurse Practitioner

## 2020-07-29 NOTE — Telephone Encounter (Signed)
Refill sent.

## 2020-08-09 ENCOUNTER — Ambulatory Visit: Payer: Self-pay | Admitting: Nurse Practitioner

## 2020-08-18 ENCOUNTER — Ambulatory Visit
Admission: RE | Admit: 2020-08-18 | Discharge: 2020-08-18 | Disposition: A | Discharge status: Home / Self Care | Payer: Self-pay | Source: Ambulatory Visit | Attending: Oncology | Admitting: Oncology

## 2020-08-18 ENCOUNTER — Ambulatory Visit: Payer: Self-pay | Attending: Oncology

## 2020-08-18 ENCOUNTER — Other Ambulatory Visit: Payer: Self-pay

## 2020-08-18 VITALS — BP 138/92 | HR 65 | Temp 97.0°F | Ht 63.75 in | Wt 225.9 lb

## 2020-08-18 DIAGNOSIS — Z0000 Encounter for general adult medical examination without abnormal findings: Secondary | ICD-10-CM | POA: Insufficient documentation

## 2020-08-18 NOTE — Progress Notes (Signed)
  Subjective:     Patient ID: [name], female   DOB: [birth year], 59 y.o.   MRN: [id number]  HPI   Review of Systems     Objective:   Physical Exam Chest:     Breasts:        Right: No swelling, bleeding, inverted nipple, mass, nipple discharge, skin change or tenderness.        Left: No swelling, bleeding, inverted nipple, mass, nipple discharge, skin change or tenderness.        Assessment:     59 year old patient presents for [facility] clinic visit.  Patient screened, and meets B[facility]CP eligibility.  Patient does not have insurance, Medicare or Medicaid. Instructed patient on breast self awareness using teach back method.  Clinical breast exam unremarkable.  No mass or lump palpated.   Risk Assessment    Risk Scores      08/18/2020   Last edited by: Alta Corning, CMA   5-year risk: 1.4 %   Lifetime risk: 7.3 %            Plan:     Sent for bilateral screening mammogram.

## 2020-08-23 NOTE — Progress Notes (Signed)
Letter mailed from [facility] to notify of normal mammogram results.  Patient to return in one year for annual screening.  Copy to [facility]. 

## 2020-08-31 ENCOUNTER — Ambulatory Visit: Payer: Medicare Other | Admitting: Internal Medicine

## 2020-09-09 ENCOUNTER — Ambulatory Visit: Payer: Self-pay

## 2020-09-15 ENCOUNTER — Ambulatory Visit: Payer: Self-pay | Admitting: Nurse Practitioner

## 2020-09-22 ENCOUNTER — Other Ambulatory Visit: Payer: Self-pay | Admitting: Internal Medicine

## 2020-09-22 ENCOUNTER — Other Ambulatory Visit: Payer: Self-pay | Admitting: Nurse Practitioner

## 2020-09-22 DIAGNOSIS — J4520 Mild intermittent asthma, uncomplicated: Secondary | ICD-10-CM

## 2020-09-22 DIAGNOSIS — H1010 Acute atopic conjunctivitis, unspecified eye: Secondary | ICD-10-CM

## 2020-09-22 DIAGNOSIS — I10 Essential (primary) hypertension: Secondary | ICD-10-CM

## 2020-09-24 ENCOUNTER — Ambulatory Visit: Payer: Self-pay

## 2020-09-24 ENCOUNTER — Other Ambulatory Visit: Payer: Self-pay | Admitting: Nurse Practitioner

## 2020-09-24 DIAGNOSIS — I10 Essential (primary) hypertension: Secondary | ICD-10-CM

## 2020-10-07 ENCOUNTER — Other Ambulatory Visit: Payer: Self-pay | Admitting: Nurse Practitioner

## 2020-10-07 DIAGNOSIS — I10 Essential (primary) hypertension: Secondary | ICD-10-CM

## 2020-10-07 NOTE — Telephone Encounter (Signed)
Please see patient request.

## 2020-10-25 ENCOUNTER — Other Ambulatory Visit: Payer: Self-pay | Admitting: Infectious Diseases

## 2020-10-25 ENCOUNTER — Other Ambulatory Visit: Payer: Self-pay | Admitting: Nurse Practitioner

## 2020-10-25 DIAGNOSIS — B20 Human immunodeficiency virus [HIV] disease: Secondary | ICD-10-CM

## 2020-10-25 DIAGNOSIS — I10 Essential (primary) hypertension: Secondary | ICD-10-CM

## 2020-10-25 NOTE — Telephone Encounter (Signed)
This was sent in last month for 90-day supply. She needs a follow up then she will get a year supply of medication.

## 2020-10-25 NOTE — Telephone Encounter (Signed)
Refill request

## 2020-10-26 ENCOUNTER — Telehealth: Payer: Self-pay | Admitting: Nurse Practitioner

## 2020-10-27 NOTE — Telephone Encounter (Signed)
Please call this patient. This has been refilled. She need a follow up apt. Thanks

## 2020-10-28 ENCOUNTER — Telehealth: Payer: Self-pay | Admitting: Nurse Practitioner

## 2020-10-28 NOTE — Telephone Encounter (Signed)
Pt has been called several times no answer and no Vm came on to leave a message Calling Pt to schedule a follow up visit

## 2020-12-15 ENCOUNTER — Other Ambulatory Visit: Payer: Self-pay | Admitting: Pharmacist

## 2020-12-15 ENCOUNTER — Ambulatory Visit: Payer: Medicare Other

## 2020-12-15 ENCOUNTER — Other Ambulatory Visit: Payer: Medicare Other

## 2020-12-15 ENCOUNTER — Other Ambulatory Visit: Payer: Self-pay

## 2020-12-15 ENCOUNTER — Telehealth: Payer: Self-pay

## 2020-12-15 DIAGNOSIS — B20 Human immunodeficiency virus [HIV] disease: Secondary | ICD-10-CM

## 2020-12-15 DIAGNOSIS — Z79899 Other long term (current) drug therapy: Secondary | ICD-10-CM

## 2020-12-15 DIAGNOSIS — Z113 Encounter for screening for infections with a predominantly sexual mode of transmission: Secondary | ICD-10-CM | POA: Diagnosis not present

## 2020-12-15 DIAGNOSIS — J4520 Mild intermittent asthma, uncomplicated: Secondary | ICD-10-CM

## 2020-12-15 MED ORDER — ALBUTEROL SULFATE HFA 108 (90 BASE) MCG/ACT IN AERS: 1.0000 | INHALATION_SPRAY | RESPIRATORY_TRACT | 11 refills | Status: DC | PRN

## 2020-12-15 MED ORDER — FLOVENT HFA 110 MCG/ACT IN AERO: 2.0000 | INHALATION_SPRAY | Freq: Every day | RESPIRATORY_TRACT | 11 refills | Status: DC | PRN

## 2020-12-15 NOTE — Progress Notes (Signed)
Printing off Ventolin and Flovent Rx for patient assistance.

## 2020-12-15 NOTE — Telephone Encounter (Signed)
[facility] Patient Advocate Encounter  Completed and sent [employer] application for VENTOLIN & FLOVENT for this patient who is uninsured.    Patient assistance phone number for follow up is [phone].   This encounter will be updated until final determination.     Clearance Coots, CPhT Specialty Pharmacy Patient [facility] Phone: [phone] Fax:  [phone]

## 2020-12-16 LAB — URINE CYTOLOGY ANCILLARY ONLY
Chlamydia: NEGATIVE
Comment: NEGATIVE
Comment: NORMAL
Neisseria Gonorrhea: NEGATIVE

## 2020-12-16 LAB — T-HELPER CELL (CD4) - (RCID CLINIC ONLY)
CD4 % Helper T Cell: 47 % (ref 33–65)
CD4 T Cell Abs: 1561 /uL (ref 400–1790)

## 2020-12-17 LAB — CBC
HCT: 39.1 % (ref 35.0–45.0)
Hemoglobin: 13.3 g/dL (ref 11.7–15.5)
MCH: 32 pg (ref 27.0–33.0)
MCHC: 34 g/dL (ref 32.0–36.0)
MCV: 94 fL (ref 80.0–100.0)
MPV: 10.6 fL (ref 7.5–12.5)
Platelets: 318 10*3/uL (ref 140–400)
RBC: 4.16 10*6/uL (ref 3.80–5.10)
RDW: 12.6 % (ref 11.0–15.0)
WBC: 6.9 10*3/uL (ref 3.8–10.8)

## 2020-12-17 LAB — COMPREHENSIVE METABOLIC PANEL
AG Ratio: 1.5 (calc) (ref 1.0–2.5)
ALT: 21 U/L (ref 6–29)
AST: 20 U/L (ref 10–35)
Albumin: 4.4 g/dL (ref 3.6–5.1)
Alkaline phosphatase (APISO): 73 U/L (ref 37–153)
BUN: 14 mg/dL (ref 7–25)
CO2: 28 mmol/L (ref 20–32)
Calcium: 9.9 mg/dL (ref 8.6–10.4)
Chloride: 106 mmol/L (ref 98–110)
Creat: 0.62 mg/dL (ref 0.50–1.05)
Globulin: 2.9 g/dL (calc) (ref 1.9–3.7)
Glucose, Bld: 86 mg/dL (ref 65–99)
Potassium: 4.5 mmol/L (ref 3.5–5.3)
Sodium: 140 mmol/L (ref 135–146)
Total Bilirubin: 0.3 mg/dL (ref 0.2–1.2)
Total Protein: 7.3 g/dL (ref 6.1–8.1)

## 2020-12-17 LAB — LIPID PANEL
Cholesterol: 282 mg/dL — ABNORMAL HIGH (ref <200)
HDL: 73 mg/dL (ref >=50)
LDL Cholesterol (Calc): 171 mg/dL (calc) — ABNORMAL HIGH
Non-HDL Cholesterol (Calc): 209 mg/dL (calc) — ABNORMAL HIGH (ref <130)
Total CHOL/HDL Ratio: 3.9 (calc) (ref <5.0)
Triglycerides: 214 mg/dL — ABNORMAL HIGH (ref <150)

## 2020-12-17 LAB — HIV-1 RNA QUANT-NO REFLEX-BLD
HIV 1 RNA Quant: <20 Copies/mL
HIV-1 RNA Quant, Log: <1.3 Log cps/mL

## 2020-12-17 LAB — RPR: RPR Ser Ql: NONREACTIVE

## 2020-12-27 ENCOUNTER — Other Ambulatory Visit: Payer: Self-pay

## 2020-12-27 ENCOUNTER — Telehealth: Payer: Self-pay

## 2020-12-27 NOTE — Telephone Encounter (Signed)
[facility] Patient Advocate Encounter  Patient was approved for Flovent through [employer] Patient Assistance Program from 12/16/20 to 12/15/21  Patient will need to call in her refills every 63 days to [phone] Rx # [id number].  Ventolin HFA is no longer on the [employer] Program.   Clearance Coots, CPhT Specialty Pharmacy Patient [facility]ious Disease Phone: [phone] Fax:  [phone]

## 2020-12-30 ENCOUNTER — Ambulatory Visit: Payer: Self-pay | Admitting: Infectious Diseases

## 2021-01-11 ENCOUNTER — Other Ambulatory Visit: Payer: Self-pay | Admitting: Nurse Practitioner

## 2021-01-11 DIAGNOSIS — I10 Essential (primary) hypertension: Secondary | ICD-10-CM

## 2021-01-13 ENCOUNTER — Ambulatory Visit (INDEPENDENT_AMBULATORY_CARE_PROVIDER_SITE_OTHER): Payer: Medicare Other | Admitting: Infectious Diseases

## 2021-01-13 ENCOUNTER — Other Ambulatory Visit: Payer: Self-pay

## 2021-01-13 ENCOUNTER — Encounter: Payer: Self-pay | Admitting: Infectious Diseases

## 2021-01-13 VITALS — BP 109/75 | HR 58 | Temp 98.4°F | Wt 220.0 lb

## 2021-01-13 DIAGNOSIS — Z113 Encounter for screening for infections with a predominantly sexual mode of transmission: Secondary | ICD-10-CM

## 2021-01-13 DIAGNOSIS — B20 Human immunodeficiency virus [HIV] disease: Secondary | ICD-10-CM

## 2021-01-13 DIAGNOSIS — I10 Essential (primary) hypertension: Secondary | ICD-10-CM

## 2021-01-13 DIAGNOSIS — J4520 Mild intermittent asthma, uncomplicated: Secondary | ICD-10-CM

## 2021-01-13 DIAGNOSIS — Z23 Encounter for immunization: Secondary | ICD-10-CM

## 2021-01-13 DIAGNOSIS — Z79899 Other long term (current) drug therapy: Secondary | ICD-10-CM
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
## 2021-01-13 NOTE — Addendum Note (Signed)
Addended by: Andree Coss on: 01/13/2021 05:05 PM   Modules accepted: Orders

## 2021-01-13 NOTE — Assessment & Plan Note (Signed)
Well controlled Asx.

## 2021-01-13 NOTE — Assessment & Plan Note (Signed)
States she is at her baseline Using inh prn.

## 2021-01-13 NOTE — Assessment & Plan Note (Addendum)
She is doing well mammo done 07-2020 Pap qoy Not sexually active.  pcv 23 today rtc in 9 months with labs prior.

## 2021-01-26 ENCOUNTER — Encounter: Payer: Self-pay | Admitting: Infectious Diseases

## 2021-02-24 ENCOUNTER — Encounter: Payer: Self-pay | Admitting: Nurse Practitioner

## 2021-03-10 ENCOUNTER — Encounter: Payer: Self-pay | Admitting: Nurse Practitioner

## 2021-03-31 ENCOUNTER — Encounter: Payer: Self-pay | Admitting: Nurse Practitioner

## 2021-04-12 ENCOUNTER — Other Ambulatory Visit: Payer: Self-pay | Admitting: Infectious Diseases

## 2021-04-12 ENCOUNTER — Other Ambulatory Visit: Payer: Self-pay | Admitting: Nurse Practitioner

## 2021-04-12 DIAGNOSIS — I10 Essential (primary) hypertension: Secondary | ICD-10-CM

## 2021-04-12 DIAGNOSIS — B20 Human immunodeficiency virus [HIV] disease: Secondary | ICD-10-CM

## 2021-04-13 ENCOUNTER — Other Ambulatory Visit: Payer: Self-pay | Admitting: Nurse Practitioner

## 2021-04-13 DIAGNOSIS — I10 Essential (primary) hypertension: Secondary | ICD-10-CM

## 2021-05-06 ENCOUNTER — Other Ambulatory Visit: Payer: Self-pay | Admitting: Nurse Practitioner

## 2021-05-06 DIAGNOSIS — I10 Essential (primary) hypertension: Secondary | ICD-10-CM

## 2021-05-06 MED ORDER — ATENOLOL 25 MG PO TABS: ORAL_TABLET | ORAL | 0 refills | Status: DC

## 2021-05-10 ENCOUNTER — Other Ambulatory Visit: Payer: Self-pay | Admitting: Infectious Diseases

## 2021-05-10 DIAGNOSIS — B20 Human immunodeficiency virus [HIV] disease: Secondary | ICD-10-CM

## 2021-06-08 ENCOUNTER — Telehealth: Payer: Self-pay

## 2021-06-08 DIAGNOSIS — I10 Essential (primary) hypertension: Secondary | ICD-10-CM

## 2021-06-08 MED ORDER — ATENOLOL 25 MG PO TABS
ORAL_TABLET | ORAL | 3 refills | Status: DC
Start: 2021-06-08 — End: 2021-11-17

## 2021-06-08 NOTE — Telephone Encounter (Signed)
Refills sent

## 2021-06-08 NOTE — Telephone Encounter (Signed)
Atenolol  [facility]

## 2021-06-14 ENCOUNTER — Ambulatory Visit: Payer: Self-pay

## 2021-06-15 ENCOUNTER — Ambulatory Visit: Payer: Self-pay

## 2021-06-16 ENCOUNTER — Ambulatory Visit: Payer: Self-pay

## 2021-06-22 ENCOUNTER — Other Ambulatory Visit: Payer: Self-pay

## 2021-06-22 ENCOUNTER — Ambulatory Visit: Payer: Self-pay

## 2021-08-19 ENCOUNTER — Ambulatory Visit (INDEPENDENT_AMBULATORY_CARE_PROVIDER_SITE_OTHER): Payer: Self-pay

## 2021-08-19 ENCOUNTER — Other Ambulatory Visit: Payer: Self-pay

## 2021-08-19 DIAGNOSIS — Z23 Encounter for immunization: Secondary | ICD-10-CM

## 2021-08-24 ENCOUNTER — Other Ambulatory Visit: Payer: Self-pay

## 2021-08-24 ENCOUNTER — Ambulatory Visit
Admission: RE | Admit: 2021-08-24 | Discharge: 2021-08-24 | Disposition: A | Discharge status: Home / Self Care | Payer: Self-pay | Source: Ambulatory Visit | Attending: Oncology | Admitting: Oncology

## 2021-08-24 ENCOUNTER — Ambulatory Visit: Payer: Self-pay | Attending: Oncology

## 2021-08-24 VITALS — BP 132/85 | HR 62 | Temp 97.2°F | Resp 18 | Wt 215.3 lb

## 2021-08-24 DIAGNOSIS — Z0000 Encounter for general adult medical examination without abnormal findings: Secondary | ICD-10-CM | POA: Insufficient documentation

## 2021-08-24 NOTE — Progress Notes (Signed)
  Subjective:     Patient ID: Sheri Davis, female   DOB: 05-07-1961, 60 y.o.   MRN: [id number]  HPI   Review of Systems     Objective:   Physical Exam Chest:  Breasts:    Right: No swelling, bleeding, inverted nipple, mass, nipple discharge, skin change or tenderness.     Left: No swelling, bleeding, inverted nipple, mass, nipple discharge, skin change or tenderness.       Assessment:     60 year old patient presents for [facility] clinic visit.  Patient screened, and meets [facility] eligibility.  Patient does not have insurance, Medicare or Medicaid.  Instructed patient on breast self awareness using teach back method.  Clinical breast exam unremarkable. No mass or lump palpated. Risk Assessment     Risk Scores       08/24/2021 08/18/2020   Last edited by: Dimitri Ped, CMA [place], [place], [place]   5-year risk: 1.4 % 1.4 %   Lifetime risk: 7.1 % 7.3 %               Plan:     Sent for bilateral screening mammogram.

## 2021-09-08 NOTE — Progress Notes (Signed)
Letter mailed from [facility] to notify of normal mammogram results.  Patient to return in one year for annual screening.  Copy to [facility]. 

## 2021-09-27 ENCOUNTER — Encounter: Payer: Self-pay | Admitting: Infectious Diseases

## 2021-09-27 ENCOUNTER — Other Ambulatory Visit (HOSPITAL_COMMUNITY): Payer: Self-pay

## 2021-09-28 ENCOUNTER — Other Ambulatory Visit: Payer: Self-pay | Admitting: Infectious Diseases

## 2021-09-28 DIAGNOSIS — B20 Human immunodeficiency virus [HIV] disease: Secondary | ICD-10-CM

## 2021-10-04 ENCOUNTER — Other Ambulatory Visit: Payer: Self-pay

## 2021-10-05 ENCOUNTER — Other Ambulatory Visit: Payer: Self-pay

## 2021-10-05 ENCOUNTER — Telehealth: Payer: Self-pay

## 2021-10-05 DIAGNOSIS — Z79899 Other long term (current) drug therapy: Secondary | ICD-10-CM

## 2021-10-05 DIAGNOSIS — Z113 Encounter for screening for infections with a predominantly sexual mode of transmission: Secondary | ICD-10-CM

## 2021-10-05 DIAGNOSIS — B20 Human immunodeficiency virus [HIV] disease: Secondary | ICD-10-CM

## 2021-10-05 NOTE — Telephone Encounter (Signed)
Offered patient Chief Executive Officer vaccine, patient declined in further Booster vaccines at this time.  Sheri Davis

## 2021-10-06 LAB — URINE CYTOLOGY ANCILLARY ONLY
Chlamydia: NEGATIVE
Comment: NEGATIVE
Comment: NORMAL
Neisseria Gonorrhea: NEGATIVE

## 2021-10-06 LAB — T-HELPER CELL (CD4) - (RCID CLINIC ONLY)
CD4 % Helper T Cell: 45 % (ref 33–65)
CD4 T Cell Abs: 1263 /uL (ref 400–1790)

## 2021-10-08 LAB — CBC
HCT: 40 % (ref 35.0–45.0)
Hemoglobin: 13.6 g/dL (ref 11.7–15.5)
MCH: 32.6 pg (ref 27.0–33.0)
MCHC: 34 g/dL (ref 32.0–36.0)
MCV: 95.9 fL (ref 80.0–100.0)
MPV: 10.5 fL (ref 7.5–12.5)
Platelets: 309 10*3/uL (ref 140–400)
RBC: 4.17 10*6/uL (ref 3.80–5.10)
RDW: 11.9 % (ref 11.0–15.0)
WBC: 7.3 10*3/uL (ref 3.8–10.8)

## 2021-10-08 LAB — COMPREHENSIVE METABOLIC PANEL
AG Ratio: 1.6 (calc) (ref 1.0–2.5)
ALT: 21 U/L (ref 6–29)
AST: 20 U/L (ref 10–35)
Albumin: 4.1 g/dL (ref 3.6–5.1)
Alkaline phosphatase (APISO): 78 U/L (ref 37–153)
BUN: 15 mg/dL (ref 7–25)
CO2: 23 mmol/L (ref 20–32)
Calcium: 9.2 mg/dL (ref 8.6–10.4)
Chloride: 105 mmol/L (ref 98–110)
Creat: 0.58 mg/dL (ref 0.50–1.03)
Globulin: 2.6 g/dL (calc) (ref 1.9–3.7)
Glucose, Bld: 95 mg/dL (ref 65–99)
Potassium: 4.3 mmol/L (ref 3.5–5.3)
Sodium: 140 mmol/L (ref 135–146)
Total Bilirubin: 0.2 mg/dL (ref 0.2–1.2)
Total Protein: 6.7 g/dL (ref 6.1–8.1)

## 2021-10-08 LAB — LIPID PANEL
Cholesterol: 278 mg/dL — ABNORMAL HIGH (ref <200)
HDL: 87 mg/dL (ref >=50)
LDL Cholesterol (Calc): 168 mg/dL (calc) — ABNORMAL HIGH
Non-HDL Cholesterol (Calc): 191 mg/dL (calc) — ABNORMAL HIGH (ref <130)
Total CHOL/HDL Ratio: 3.2 (calc) (ref <5.0)
Triglycerides: 104 mg/dL (ref <150)

## 2021-10-08 LAB — HIV-1 RNA QUANT-NO REFLEX-BLD
HIV 1 RNA Quant: NOT DETECTED Copies/mL
HIV-1 RNA Quant, Log: NOT DETECTED Log cps/mL

## 2021-10-08 LAB — RPR: RPR Ser Ql: NONREACTIVE

## 2021-10-18 ENCOUNTER — Ambulatory Visit: Payer: Self-pay | Admitting: Infectious Diseases

## 2021-10-28 ENCOUNTER — Encounter: Payer: Self-pay | Admitting: Infectious Diseases

## 2021-10-28 ENCOUNTER — Other Ambulatory Visit: Payer: Self-pay

## 2021-10-28 ENCOUNTER — Ambulatory Visit (INDEPENDENT_AMBULATORY_CARE_PROVIDER_SITE_OTHER): Payer: Self-pay | Admitting: Infectious Diseases

## 2021-10-28 VITALS — BP 144/87 | HR 53 | Temp 98.3°F | Wt 215.0 lb

## 2021-10-28 DIAGNOSIS — E669 Obesity, unspecified: Secondary | ICD-10-CM

## 2021-10-28 DIAGNOSIS — Z79899 Other long term (current) drug therapy: Secondary | ICD-10-CM

## 2021-10-28 DIAGNOSIS — I10 Essential (primary) hypertension: Secondary | ICD-10-CM

## 2021-10-28 DIAGNOSIS — B20 Human immunodeficiency virus [HIV] disease: Secondary | ICD-10-CM

## 2021-10-28 DIAGNOSIS — E010 Iodine-deficiency related diffuse (endemic) goiter: Secondary | ICD-10-CM

## 2021-10-28 DIAGNOSIS — Z113 Encounter for screening for infections with a predominantly sexual mode of transmission: Secondary | ICD-10-CM

## 2021-10-28 DIAGNOSIS — R739 Hyperglycemia, unspecified: Secondary | ICD-10-CM

## 2021-10-28 NOTE — Assessment & Plan Note (Signed)
Will need to get her back into PCP as her insurance allows.

## 2021-10-28 NOTE — Assessment & Plan Note (Signed)
She is doing well We discussed changing to newer ART (dovato?) however she is "scared" to change and wishes to reomain on same rx unless she has change in her LFTs or Cr.  Offered/refused condoms.  Refer for shingrix Defers COVID booster today.  I let her know that I no longer be able to be her doctor when she returns.  rtc in 9 months

## 2021-10-28 NOTE — Assessment & Plan Note (Signed)
She needs Endo f/u as her insurance allows.  Will check her TSH today.

## 2021-10-28 NOTE — Assessment & Plan Note (Signed)
Has lost 10#.  I encouraged her  She reports this is due to increased activity from her work.
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
## 2021-10-28 NOTE — Assessment & Plan Note (Signed)
Will check her A1C today

## 2021-11-16 ENCOUNTER — Other Ambulatory Visit: Payer: Self-pay

## 2021-11-16 ENCOUNTER — Telehealth: Payer: Self-pay

## 2021-11-16 DIAGNOSIS — R739 Hyperglycemia, unspecified: Secondary | ICD-10-CM

## 2021-11-16 DIAGNOSIS — E010 Iodine-deficiency related diffuse (endemic) goiter: Secondary | ICD-10-CM

## 2021-11-16 NOTE — Addendum Note (Signed)
Addended by: Harley Alto on: 11/16/2021 08:35 AM   Modules accepted: Orders

## 2021-11-16 NOTE — Telephone Encounter (Signed)
Patient is requesting Dr. Ninetta Lights refill her atenolol. Patient has not seen PCP in the last two years and plans on going back to see them in January.  Routing to provider for approval. Valarie Cones

## 2021-11-17 ENCOUNTER — Other Ambulatory Visit: Payer: Self-pay | Admitting: Infectious Diseases

## 2021-11-17 DIAGNOSIS — I10 Essential (primary) hypertension: Secondary | ICD-10-CM

## 2021-11-17 LAB — HEMOGLOBIN A1C
Hgb A1c MFr Bld: 6.1 % of total Hgb — ABNORMAL HIGH (ref <5.7)
Mean Plasma Glucose: 128 mg/dL
eAG (mmol/L): 7.1 mmol/L

## 2021-11-17 LAB — TSH: TSH: 1.29 mIU/L (ref 0.40–4.50)

## 2021-11-17 MED ORDER — ATENOLOL 25 MG PO TABS: ORAL_TABLET | ORAL | 3 refills | Status: DC

## 2021-11-22 ENCOUNTER — Other Ambulatory Visit: Payer: Self-pay | Admitting: Infectious Diseases

## 2021-11-22 DIAGNOSIS — B20 Human immunodeficiency virus [HIV] disease: Secondary | ICD-10-CM

## 2021-12-21 ENCOUNTER — Ambulatory Visit: Payer: Self-pay

## 2022-01-25 ENCOUNTER — Other Ambulatory Visit: Payer: Self-pay | Admitting: Pharmacist

## 2022-01-25 ENCOUNTER — Other Ambulatory Visit (HOSPITAL_COMMUNITY): Payer: Self-pay

## 2022-01-25 DIAGNOSIS — J4520 Mild intermittent asthma, uncomplicated: Secondary | ICD-10-CM

## 2022-01-25 MED ORDER — FLUTICASONE PROPIONATE HFA 110 MCG/ACT IN AERO: 2.0000 | INHALATION_SPRAY | Freq: Every day | RESPIRATORY_TRACT | 11 refills | Status: DC | PRN

## 2022-02-09 ENCOUNTER — Telehealth: Payer: Self-pay

## 2022-02-09 NOTE — Telephone Encounter (Signed)
[facility] Patient Advocate Encounter ? ?Completed and sent [employer] Patient Assistance Program application for Flovent [facility] for this patient who is uninsured.   ? ?Patient is approved 01/26/22 through 01/26/23. ? ?Medication was shipped to patient home on 01/30/22. ? ? ?Clearance Coots, CPhT ?Specialty Pharmacy Patient Advocate ?[facility] ?Phone: [phone] ?Fax:  [phone]  ?

## 2022-03-13 ENCOUNTER — Other Ambulatory Visit: Payer: Self-pay | Admitting: Infectious Diseases

## 2022-03-13 DIAGNOSIS — I10 Essential (primary) hypertension: Secondary | ICD-10-CM

## 2022-03-13 NOTE — Telephone Encounter (Signed)
Please advise on refill.

## 2022-05-08 ENCOUNTER — Encounter: Payer: Self-pay | Admitting: Infectious Diseases

## 2022-05-24 ENCOUNTER — Encounter: Payer: Self-pay | Admitting: Infectious Diseases

## 2022-06-14 ENCOUNTER — Other Ambulatory Visit: Payer: Self-pay | Admitting: Infectious Diseases

## 2022-06-14 DIAGNOSIS — I10 Essential (primary) hypertension: Secondary | ICD-10-CM

## 2022-06-15 NOTE — Telephone Encounter (Signed)
Please advise on refill.  Appointment scheduled for 08/30/22

## 2022-06-19 ENCOUNTER — Telehealth: Payer: Self-pay

## 2022-06-19 DIAGNOSIS — I10 Essential (primary) hypertension: Secondary | ICD-10-CM

## 2022-06-19 NOTE — Telephone Encounter (Signed)
Received faxed refill request for atenolol. Does not appear patient is in care with a PCP. Will route to provider.   Sandie Ano, RN

## 2022-06-20 MED ORDER — ATENOLOL 25 MG PO TABS: 25.0000 mg | ORAL_TABLET | Freq: Every day | ORAL | 0 refills | Status: DC

## 2022-06-20 NOTE — Addendum Note (Signed)
Addended by: Linna Hoff D on: 06/20/2022 08:26 AM   Modules accepted: Orders

## 2022-06-21 ENCOUNTER — Ambulatory Visit: Payer: Medicare Other

## 2022-07-10 ENCOUNTER — Other Ambulatory Visit: Payer: Self-pay | Admitting: Infectious Diseases

## 2022-07-10 DIAGNOSIS — I10 Essential (primary) hypertension: Secondary | ICD-10-CM

## 2022-07-11 ENCOUNTER — Telehealth: Payer: Self-pay

## 2022-07-11 NOTE — Telephone Encounter (Signed)
Atenolol   [facility]  [address]

## 2022-07-13 ENCOUNTER — Other Ambulatory Visit: Payer: Self-pay | Admitting: Nurse Practitioner

## 2022-07-13 DIAGNOSIS — I10 Essential (primary) hypertension: Secondary | ICD-10-CM

## 2022-07-13 MED ORDER — ATENOLOL 25 MG PO TABS: 25.0000 mg | ORAL_TABLET | Freq: Every day | ORAL | 2 refills | Status: DC

## 2022-07-27 ENCOUNTER — Other Ambulatory Visit: Payer: Self-pay

## 2022-07-27 DIAGNOSIS — Z113 Encounter for screening for infections with a predominantly sexual mode of transmission: Secondary | ICD-10-CM

## 2022-07-27 DIAGNOSIS — B20 Human immunodeficiency virus [HIV] disease: Secondary | ICD-10-CM

## 2022-07-27 DIAGNOSIS — Z79899 Other long term (current) drug therapy: Secondary | ICD-10-CM

## 2022-08-02 ENCOUNTER — Other Ambulatory Visit (HOSPITAL_COMMUNITY): Payer: Self-pay

## 2022-08-02 ENCOUNTER — Other Ambulatory Visit (HOSPITAL_COMMUNITY)
Admission: RE | Admit: 2022-08-02 | Discharge: 2022-08-02 | Disposition: A | Discharge status: Home / Self Care | Payer: Medicare Other | Source: Ambulatory Visit | Attending: Infectious Diseases | Admitting: Infectious Diseases

## 2022-08-02 ENCOUNTER — Other Ambulatory Visit: Payer: Medicare Other

## 2022-08-02 ENCOUNTER — Other Ambulatory Visit: Payer: Self-pay

## 2022-08-02 ENCOUNTER — Other Ambulatory Visit: Payer: Self-pay | Admitting: Infectious Diseases

## 2022-08-02 DIAGNOSIS — B20 Human immunodeficiency virus [HIV] disease: Secondary | ICD-10-CM

## 2022-08-02 DIAGNOSIS — R7303 Prediabetes: Secondary | ICD-10-CM

## 2022-08-02 DIAGNOSIS — Z113 Encounter for screening for infections with a predominantly sexual mode of transmission: Secondary | ICD-10-CM | POA: Insufficient documentation

## 2022-08-02 DIAGNOSIS — E669 Obesity, unspecified: Secondary | ICD-10-CM

## 2022-08-02 NOTE — Addendum Note (Signed)
Addended by: Tressa Busman T on: 08/02/2022 12:14 PM   Modules accepted: Orders

## 2022-08-02 NOTE — Addendum Note (Signed)
Addended by: Tressa Busman T on: 08/02/2022 11:34 AM   Modules accepted: Orders

## 2022-08-03 LAB — T-HELPER CELL (CD4) - (RCID CLINIC ONLY)
CD4 % Helper T Cell: 46 % (ref 33–65)
CD4 T Cell Abs: 1234 /uL (ref 400–1790)

## 2022-08-03 LAB — URINE CYTOLOGY ANCILLARY ONLY
Chlamydia: NEGATIVE
Comment: NEGATIVE
Comment: NORMAL
Neisseria Gonorrhea: NEGATIVE

## 2022-08-04 LAB — COMPLETE METABOLIC PANEL WITH GFR
AG Ratio: 1.5 (calc) (ref 1.0–2.5)
ALT: 21 U/L (ref 6–29)
AST: 21 U/L (ref 10–35)
Albumin: 4 g/dL (ref 3.6–5.1)
Alkaline phosphatase (APISO): 80 U/L (ref 37–153)
BUN: 17 mg/dL (ref 7–25)
CO2: 27 mmol/L (ref 20–32)
Calcium: 9.5 mg/dL (ref 8.6–10.4)
Chloride: 107 mmol/L (ref 98–110)
Creat: 0.79 mg/dL (ref 0.50–1.05)
Globulin: 2.6 g/dL (calc) (ref 1.9–3.7)
Glucose, Bld: 97 mg/dL (ref 65–99)
Potassium: 4.9 mmol/L (ref 3.5–5.3)
Sodium: 141 mmol/L (ref 135–146)
Total Bilirubin: 0.2 mg/dL (ref 0.2–1.2)
Total Protein: 6.6 g/dL (ref 6.1–8.1)
eGFR: 86 mL/min/{1.73_m2} (ref >=60)

## 2022-08-04 LAB — RPR: RPR Ser Ql: NONREACTIVE

## 2022-08-04 LAB — CBC WITH DIFFERENTIAL/PLATELET
Absolute Monocytes: 391 cells/uL (ref 200–950)
Basophils Absolute: 31 cells/uL (ref 0–200)
Basophils Relative: 0.5 %
Eosinophils Absolute: 161 cells/uL (ref 15–500)
Eosinophils Relative: 2.6 %
HCT: 40.2 % (ref 35.0–45.0)
Hemoglobin: 13.8 g/dL (ref 11.7–15.5)
Lymphs Abs: 2945 cells/uL (ref 850–3900)
MCH: 33.2 pg — ABNORMAL HIGH (ref 27.0–33.0)
MCHC: 34.3 g/dL (ref 32.0–36.0)
MCV: 96.6 fL (ref 80.0–100.0)
MPV: 9.9 fL (ref 7.5–12.5)
Monocytes Relative: 6.3 %
Neutro Abs: 2672 cells/uL (ref 1500–7800)
Neutrophils Relative %: 43.1 %
Platelets: 309 10*3/uL (ref 140–400)
RBC: 4.16 10*6/uL (ref 3.80–5.10)
RDW: 12.1 % (ref 11.0–15.0)
Total Lymphocyte: 47.5 %
WBC: 6.2 10*3/uL (ref 3.8–10.8)

## 2022-08-04 LAB — HIV-1 RNA QUANT-NO REFLEX-BLD
HIV 1 RNA Quant: NOT DETECTED Copies/mL
HIV-1 RNA Quant, Log: NOT DETECTED Log cps/mL

## 2022-08-04 LAB — HEMOGLOBIN A1C
Hgb A1c MFr Bld: 5.8 % of total Hgb — ABNORMAL HIGH (ref <5.7)
Mean Plasma Glucose: 120 mg/dL
eAG (mmol/L): 6.6 mmol/L

## 2022-08-06 ENCOUNTER — Encounter: Payer: Self-pay | Admitting: Infectious Diseases

## 2022-08-09 ENCOUNTER — Other Ambulatory Visit: Payer: Self-pay

## 2022-08-10 ENCOUNTER — Other Ambulatory Visit: Payer: Self-pay | Admitting: Infectious Diseases

## 2022-08-10 DIAGNOSIS — Z1231 Encounter for screening mammogram for malignant neoplasm of breast: Secondary | ICD-10-CM

## 2022-08-23 ENCOUNTER — Encounter: Payer: Self-pay | Admitting: Infectious Diseases

## 2022-08-23 ENCOUNTER — Other Ambulatory Visit (HOSPITAL_COMMUNITY): Payer: Self-pay

## 2022-08-25 ENCOUNTER — Other Ambulatory Visit: Payer: Self-pay | Admitting: Infectious Diseases

## 2022-08-25 DIAGNOSIS — B20 Human immunodeficiency virus [HIV] disease: Secondary | ICD-10-CM

## 2022-08-28 NOTE — Telephone Encounter (Signed)
Last dispensed 9/11 - patient has appointment 10/4. Refills to be provided then.   Beryle Flock, RN'

## 2022-08-30 ENCOUNTER — Other Ambulatory Visit: Payer: Self-pay

## 2022-08-30 ENCOUNTER — Ambulatory Visit (INDEPENDENT_AMBULATORY_CARE_PROVIDER_SITE_OTHER): Payer: Self-pay | Admitting: Infectious Diseases

## 2022-08-30 ENCOUNTER — Encounter: Payer: Self-pay | Admitting: Infectious Diseases

## 2022-08-30 VITALS — BP 121/83 | HR 61 | Temp 97.6°F | Wt 211.8 lb

## 2022-08-30 DIAGNOSIS — Z23 Encounter for immunization: Secondary | ICD-10-CM

## 2022-08-30 DIAGNOSIS — Z5181 Encounter for therapeutic drug level monitoring: Secondary | ICD-10-CM

## 2022-08-30 DIAGNOSIS — B20 Human immunodeficiency virus [HIV] disease: Secondary | ICD-10-CM | POA: Insufficient documentation

## 2022-08-30 DIAGNOSIS — Z7185 Encounter for immunization safety counseling: Secondary | ICD-10-CM | POA: Insufficient documentation

## 2022-08-30 DIAGNOSIS — Z0000 Encounter for general adult medical examination without abnormal findings: Secondary | ICD-10-CM

## 2022-08-30 DIAGNOSIS — Z113 Encounter for screening for infections with a predominantly sexual mode of transmission: Secondary | ICD-10-CM

## 2022-08-30 MED ORDER — EFAVIRENZ-EMTRICITAB-TENOFO DF 600-200-300 MG PO TABS: 1.0000 | ORAL_TABLET | Freq: Every day | ORAL | 11 refills | Status: DC
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
## 2022-09-06 ENCOUNTER — Other Ambulatory Visit (HOSPITAL_COMMUNITY): Payer: Self-pay

## 2022-09-06 ENCOUNTER — Encounter: Payer: Self-pay | Admitting: Infectious Diseases

## 2022-09-06 ENCOUNTER — Ambulatory Visit (INDEPENDENT_AMBULATORY_CARE_PROVIDER_SITE_OTHER): Payer: Medicare Other | Admitting: Infectious Diseases

## 2022-09-06 VITALS — BP 180/109 | HR 61 | Ht 63.75 in | Wt 211.8 lb

## 2022-09-06 DIAGNOSIS — Z113 Encounter for screening for infections with a predominantly sexual mode of transmission: Secondary | ICD-10-CM

## 2022-09-06 DIAGNOSIS — R7303 Prediabetes: Secondary | ICD-10-CM | POA: Diagnosis not present

## 2022-09-06 DIAGNOSIS — E010 Iodine-deficiency related diffuse (endemic) goiter: Secondary | ICD-10-CM | POA: Diagnosis not present

## 2022-09-06 DIAGNOSIS — I10 Essential (primary) hypertension: Secondary | ICD-10-CM | POA: Diagnosis not present

## 2022-09-06 DIAGNOSIS — F411 Generalized anxiety disorder: Secondary | ICD-10-CM | POA: Diagnosis not present

## 2022-09-06 DIAGNOSIS — E782 Mixed hyperlipidemia: Secondary | ICD-10-CM

## 2022-09-06 DIAGNOSIS — B20 Human immunodeficiency virus [HIV] disease: Secondary | ICD-10-CM | POA: Diagnosis not present

## 2022-09-06 DIAGNOSIS — J4520 Mild intermittent asthma, uncomplicated: Secondary | ICD-10-CM | POA: Diagnosis present

## 2022-09-06 DIAGNOSIS — E669 Obesity, unspecified: Secondary | ICD-10-CM

## 2022-09-06 DIAGNOSIS — Z124 Encounter for screening for malignant neoplasm of cervix: Secondary | ICD-10-CM | POA: Diagnosis not present

## 2022-09-06 MED ORDER — LORAZEPAM 0.5 MG PO TABS: 0.5000 mg | ORAL_TABLET | Freq: Two times a day (BID) | ORAL | 0 refills | Status: DC | PRN

## 2022-09-06 NOTE — Assessment & Plan Note (Signed)
Appreciate [name]'s assistance, care.

## 2022-09-06 NOTE — Assessment & Plan Note (Signed)
I refilled her valium.  Reviewed her PMP.

## 2022-09-06 NOTE — Assessment & Plan Note (Signed)
Much improved on repeat measure.  Encouraged her to take her rx.

## 2022-09-06 NOTE — Assessment & Plan Note (Signed)
Lab Results  Component Value Date   CHOL 278 (H) 10/05/2021   HDL 87 10/05/2021   LDLCALC 168 (H) 10/05/2021   TRIG 104 10/05/2021   CHOLHDL 3.2 10/05/2021    Will re-eval for statin at f/u.

## 2022-09-06 NOTE — Assessment & Plan Note (Signed)
Most recent A1C was improved (5.8).  Encouraged her to watch her diet and exercise (walking daily with co-worker) (which she is doing).

## 2022-09-06 NOTE — Assessment & Plan Note (Signed)
Will work on getting her back into Endo.

## 2022-09-06 NOTE — Assessment & Plan Note (Signed)
Has lost 15# since 2021 Encouraged her to watch her diet and exercise (walking daily with co-worker) (which she is doing).
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
## 2022-09-06 NOTE — Assessment & Plan Note (Signed)
She is doing well, has concerns about her insurance coverage.  She will meet with financial advisor today.  Will see her back in 6 months.  Pap set, mammo this month, colon next year.  Can help her get adap as needed.

## 2022-09-06 NOTE — Assessment & Plan Note (Signed)
Has her inhalers and is currently asx.

## 2022-09-13 ENCOUNTER — Other Ambulatory Visit (HOSPITAL_COMMUNITY): Payer: Self-pay

## 2022-09-13 ENCOUNTER — Encounter: Payer: Self-pay | Admitting: Nurse Practitioner

## 2022-09-19 ENCOUNTER — Other Ambulatory Visit: Payer: Self-pay | Admitting: Nurse Practitioner

## 2022-09-19 DIAGNOSIS — I10 Essential (primary) hypertension: Secondary | ICD-10-CM

## 2022-09-20 ENCOUNTER — Ambulatory Visit
Admission: RE | Admit: 2022-09-20 | Discharge: 2022-09-20 | Disposition: A | Discharge status: Home / Self Care | Payer: Medicare Other | Source: Ambulatory Visit | Attending: Infectious Diseases | Admitting: Infectious Diseases

## 2022-09-20 DIAGNOSIS — Z1231 Encounter for screening mammogram for malignant neoplasm of breast: Secondary | ICD-10-CM

## 2022-10-03 ENCOUNTER — Telehealth: Payer: Self-pay

## 2022-10-03 DIAGNOSIS — I10 Essential (primary) hypertension: Secondary | ICD-10-CM

## 2022-10-03 NOTE — Telephone Encounter (Signed)
Patient called office today requesting temporary refill on atenolol (TENORMIN) 25 MG tablet . States that her insurance is now active and is waiting to see PCP next week. Would like 30 day sent to [place]. Will forward message to provider to advise on refill. Patient would like mychart message once refills are sent in. -Michelyn Scullin

## 2022-10-03 NOTE — Telephone Encounter (Signed)
Please send her a script to last until her appt with PCP. Thanks.

## 2022-10-04 MED ORDER — ATENOLOL 25 MG PO TABS: 25.0000 mg | ORAL_TABLET | Freq: Every day | ORAL | 2 refills | Status: DC

## 2022-10-04 NOTE — Addendum Note (Signed)
Addended by: Juanita Laster on: 10/04/2022 08:16 AM   Modules accepted: Orders

## 2022-10-11 ENCOUNTER — Ambulatory Visit (INDEPENDENT_AMBULATORY_CARE_PROVIDER_SITE_OTHER): Payer: Medicare Other | Admitting: Nurse Practitioner

## 2022-10-11 ENCOUNTER — Encounter: Payer: Self-pay | Admitting: Nurse Practitioner

## 2022-10-11 VITALS — BP 125/83 | HR 55 | Ht 63.75 in | Wt 209.0 lb

## 2022-10-11 DIAGNOSIS — G8929 Other chronic pain: Secondary | ICD-10-CM

## 2022-10-11 DIAGNOSIS — M545 Low back pain, unspecified: Secondary | ICD-10-CM | POA: Diagnosis not present

## 2022-10-11 DIAGNOSIS — J4520 Mild intermittent asthma, uncomplicated: Secondary | ICD-10-CM

## 2022-10-11 DIAGNOSIS — F4310 Post-traumatic stress disorder, unspecified: Secondary | ICD-10-CM

## 2022-10-11 DIAGNOSIS — F419 Anxiety disorder, unspecified: Secondary | ICD-10-CM

## 2022-10-11 DIAGNOSIS — F411 Generalized anxiety disorder: Secondary | ICD-10-CM | POA: Diagnosis not present

## 2022-10-11 DIAGNOSIS — E010 Iodine-deficiency related diffuse (endemic) goiter: Secondary | ICD-10-CM

## 2022-10-11 DIAGNOSIS — M25511 Pain in right shoulder: Secondary | ICD-10-CM

## 2022-10-11 DIAGNOSIS — Z0000 Encounter for general adult medical examination without abnormal findings: Secondary | ICD-10-CM

## 2022-10-11 DIAGNOSIS — I10 Essential (primary) hypertension: Secondary | ICD-10-CM

## 2022-10-11 LAB — POCT URINALYSIS DIPSTICK
Blood, UA: NEGATIVE
Glucose, UA: NEGATIVE
Ketones, UA: NEGATIVE
Leukocytes, UA: NEGATIVE
Nitrite, UA: NEGATIVE
Protein, UA: NEGATIVE
Spec Grav, UA: >=1.03 — AB (ref 1.010–1.025)
Urobilinogen, UA: 0.2 E.U./dL
pH, UA: 5.5 (ref 5.0–8.0)

## 2022-10-11 MED ORDER — HYDROXYZINE HCL 25 MG PO TABS: ORAL_TABLET | ORAL | 1 refills | Status: DC

## 2022-10-11 MED ORDER — CYCLOBENZAPRINE HCL 10 MG PO TABS: 10.0000 mg | ORAL_TABLET | Freq: Three times a day (TID) | ORAL | 0 refills | Status: DC | PRN

## 2022-10-11 MED ORDER — ATENOLOL 25 MG PO TABS: 25.0000 mg | ORAL_TABLET | Freq: Every day | ORAL | 2 refills | Status: DC

## 2022-10-11 MED ORDER — CETIRIZINE HCL 10 MG PO TABS: ORAL_TABLET | ORAL | 3 refills | Status: DC

## 2022-10-11 MED ORDER — TRIAMCINOLONE ACETONIDE 0.1 % EX CREA: 1.0000 | TOPICAL_CREAM | Freq: Two times a day (BID) | CUTANEOUS | 0 refills | Status: DC

## 2022-10-11 MED ORDER — LORAZEPAM 0.5 MG PO TABS: 0.5000 mg | ORAL_TABLET | Freq: Two times a day (BID) | ORAL | 0 refills | Status: DC | PRN

## 2022-10-11 MED ORDER — FLUTICASONE PROPIONATE HFA 110 MCG/ACT IN AERO: 2.0000 | INHALATION_SPRAY | Freq: Every day | RESPIRATORY_TRACT | 11 refills | Status: DC | PRN

## 2022-10-11 MED ORDER — ALBUTEROL SULFATE HFA 108 (90 BASE) MCG/ACT IN AERS: 1.0000 | INHALATION_SPRAY | RESPIRATORY_TRACT | 11 refills | Status: DC | PRN

## 2022-10-11 NOTE — Patient Instructions (Signed)
1. Anxiety state  - hydrOXYzine (ATARAX) 25 MG tablet; TAKE 1 TABLET(25 MG) BY MOUTH AT BEDTIME AS NEEDED  Dispense: 90 tablet; Refill: 1 - LORazepam (ATIVAN) 0.5 MG tablet; Take 1 tablet (0.5 mg total) by mouth 2 (two) times daily as needed for anxiety.  Dispense: 30 tablet; Refill: 0  2. Essential hypertension  - atenolol (TENORMIN) 25 MG tablet; Take 1 tablet (25 mg total) by mouth daily.  Dispense: 30 tablet; Refill: 2 - CBC - Comprehensive metabolic panel - Lipid Panel - TSH  3. Low back pain without sciatica, unspecified back pain laterality, unspecified chronicity   4. Mild intermittent asthma without complication  - fluticasone (FLOVENT HFA) 110 MCG/ACT inhaler; Inhale 2 puffs into the lungs daily as needed.  Dispense: 12 g; Refill: 11 - albuterol (VENTOLIN HFA) 108 (90 Base) MCG/ACT inhaler; Inhale 1 puff into the lungs every 4 (four) hours as needed for shortness of breath.  Dispense: 18 g; Refill: 11 - cetirizine (ZYRTEC) 10 MG tablet; TAKE 1 TABLET(10 MG) BY MOUTH AT BEDTIME  Dispense: 30 tablet; Refill: 3  5. PTSD (post-traumatic stress disorder)  - Ambulatory referral to Psychiatry  6. Anxiety  - Ambulatory referral to Psychiatry  7. Thyromegaly  - US THYROID - TSH  8. Routine adult health maintenance  - CBC - Comprehensive metabolic panel - Lipid Panel - TSH  9. Chronic right shoulder pain  - DG Shoulder Right  Follow up:  Follow up in 6 months

## 2022-10-11 NOTE — Progress Notes (Signed)
@Patient  ID: , female    DOB: 01-31-1961, 61 y.o.   MRN: [id number]  Chief Complaint  Patient presents with   Follow-up    Referring provider: [id number], NP   HPI  Sheri Davis presents for follow-up.  She is a previous patient of Cherrie Distance, FNP.  She  has a past medical history of Allergy, Asthma, GERD (gastroesophageal reflux disease), HIV infection (HCC), Hyperlipidemia, and Tachycardia (y-12).   Patient presents today for follow-up of chronic conditions.  We will order thyroid ultrasound to check levels.  Patient does have a history of chronic right shoulder pain.  We will check x-ray today.  Patient will need routine labs.  Patient is requesting referral to psychiatry for ongoing anxiety. Denies f/c/s, n/v/d, hemoptysis, PND, leg swelling Denies chest pain or edema     No Known Allergies  Immunization History  Administered Date(s) Administered   Influenza Split 09/08/2011, 08/14/2012   Influenza Whole 08/28/2007, 09/09/2007, 09/14/2008, 09/24/2009, 09/12/2010   Influenza,inj,Quad PF,6+ Mos 08/26/2014, 08/18/2015, 09/13/2016, 07/18/2017, 08/15/2018, 08/21/2019, 09/13/2020, 08/19/2021, 08/30/2022   Influenza-Unspecified 08/25/2013   PFIZER(Purple Top)SARS-COV-2 Vaccination 12/03/2019, 12/24/2019, 09/13/2020   PPD Test 01/29/2015, 02/04/2016, 07/01/2016, 11/16/2017, 12/01/2017   Pneumococcal Conjugate-13 04/29/2015   Pneumococcal Polysaccharide-23 08/28/2007, 01/29/2012, 01/13/2021   Tdap 08/26/2014    Past Medical History:  Diagnosis Date   Allergy    Asthma    GERD (gastroesophageal reflux disease)    HIV infection (HCC)    Hyperlipidemia    Tachycardia y-12   Pt was under stress at the time    Tobacco History: Social History   Tobacco Use  Smoking Status Never  Smokeless Tobacco Never   Counseling given: Not Answered   Outpatient Encounter Medications as of 10/11/2022  Medication Sig   cyclobenzaprine  (FLEXERIL) 10 MG tablet Take 1 tablet (10 mg total) by mouth 3 (three) times daily as needed for muscle spasms.   triamcinolone cream (KENALOG) 0.1 % Apply 1 Application topically 2 (two) times daily.   albuterol (VENTOLIN HFA) 108 (90 Base) MCG/ACT inhaler Inhale 1 puff into the lungs every 4 (four) hours as needed for shortness of breath.   atenolol (TENORMIN) 25 MG tablet Take 1 tablet (25 mg total) by mouth daily.   cetirizine (ZYRTEC) 10 MG tablet TAKE 1 TABLET(10 MG) BY MOUTH AT BEDTIME   diphenhydramine-acetaminophen (TYLENOL PM) 25-500 MG TABS tablet Take 1 tablet by mouth at bedtime as needed.   efavirenz-emtricitab-tenofovir (ATRIPLA) 600-200-300 MG tablet Take 1 tablet by mouth at bedtime.   fluticasone (FLONASE) 50 MCG/ACT nasal spray SHAKE LIQUID AND USE 1 SPRAY IN EACH NOSTRIL EVERY DAY   fluticasone (FLOVENT HFA) 110 MCG/ACT inhaler Inhale 2 puffs into the lungs daily as needed.   Fluticasone Propionate, Inhal, (FLOVENT IN) Inhale into the lungs.   hydrOXYzine (ATARAX) 25 MG tablet TAKE 1 TABLET(25 MG) BY MOUTH AT BEDTIME AS NEEDED   LORazepam (ATIVAN) 0.5 MG tablet Take 1 tablet (0.5 mg total) by mouth 2 (two) times daily as needed for anxiety.   Olopatadine HCl 0.2 % SOLN Apply 1 drop to eye daily as needed.   [DISCONTINUED] albuterol (VENTOLIN HFA) 108 (90 Base) MCG/ACT inhaler Inhale 1 puff into the lungs every 4 (four) hours as needed for shortness of breath.   [DISCONTINUED] atenolol (TENORMIN) 25 MG tablet Take 1 tablet (25 mg total) by mouth daily.   [DISCONTINUED] cetirizine (ZYRTEC) 10 MG tablet TAKE 1 TABLET(10 MG) BY MOUTH AT BEDTIME   [DISCONTINUED]  cyclobenzaprine (FLEXERIL) 10 MG tablet TAKE 1 TABLET(10 MG) BY MOUTH THREE TIMES DAILY AS NEEDED FOR MUSCLE SPASMS   [DISCONTINUED] fluticasone (FLOVENT HFA) 110 MCG/ACT inhaler Inhale 2 puffs into the lungs daily as needed.   [DISCONTINUED] hydrOXYzine (ATARAX/VISTARIL) 25 MG tablet TAKE 1 TABLET(25 MG) BY MOUTH AT BEDTIME  AS NEEDED   [DISCONTINUED] LORazepam (ATIVAN) 0.5 MG tablet Take 1 tablet (0.5 mg total) by mouth 2 (two) times daily as needed for anxiety.   [DISCONTINUED] triamcinolone cream (KENALOG) 0.1 % APPLY TOPICALLY TO THE AFFECTED AREA(S) AS DIRECTED TWICE DAILY AS NEEDED   No facility-administered encounter medications on file as of 10/11/2022.     Review of Systems  Review of Systems  Constitutional: Negative.   HENT: Negative.    Cardiovascular: Negative.   Gastrointestinal: Negative.   Musculoskeletal:        Right shoulder pain  Allergic/Immunologic: Negative.   Neurological: Negative.   Psychiatric/Behavioral:  The patient is nervous/anxious.        Physical Exam  BP 125/83   Pulse (!) 55   Ht 5' 3.75" (1.619 m)   Wt 209 lb (94.8 kg)   SpO2 96%   BMI 36.16 kg/m   Wt Readings from Last 5 Encounters:  10/11/22 209 lb (94.8 kg)  09/06/22 211 lb 12.8 oz (96.1 kg)  08/30/22 211 lb 12.8 oz (96.1 kg)  10/28/21 215 lb (97.5 kg)  08/24/21 215 lb 5 oz (97.7 kg)     Physical Exam Vitals and nursing note reviewed.  Constitutional:      General: She is not in acute distress.    Appearance: She is well-developed.  Neck:     Thyroid: Thyromegaly present.  Cardiovascular:     Rate and Rhythm: Normal rate and regular rhythm.  Pulmonary:     Effort: Pulmonary effort is normal.     Breath sounds: Normal breath sounds.  Neurological:     Mental Status: She is alert and oriented to person, place, and time.      Lab Results:  CBC    Component Value Date/Time   WBC 7.5 10/11/2022 1134   WBC 6.2 08/02/2022 0937   RBC 4.22 10/11/2022 1134   RBC 4.16 08/02/2022 0937   HGB 13.5 10/11/2022 1134   HCT 40.0 10/11/2022 1134   PLT 303 10/11/2022 1134   MCV 95 10/11/2022 1134   MCH 32.0 10/11/2022 1134   MCH 33.2 (H) 08/02/2022 0937   MCHC 33.8 10/11/2022 1134   MCHC 34.3 08/02/2022 0937   RDW 12.5 10/11/2022 1134   LYMPHSABS 2,945 08/02/2022 0937   MONOABS 438  01/30/2017 0858   EOSABS 161 08/02/2022 0937   BASOSABS 31 08/02/2022 0937    BMET    Component Value Date/Time   NA 141 10/11/2022 1134   K 5.3 (H) 10/11/2022 1134   CL 105 10/11/2022 1134   CO2 25 10/11/2022 1134   GLUCOSE 95 10/11/2022 1134   GLUCOSE 97 08/02/2022 0937   BUN 11 10/11/2022 1134   CREATININE 0.78 10/11/2022 1134   CREATININE 0.79 08/02/2022 0937   CALCIUM 9.8 10/11/2022 1134   GFRNONAA 97 01/26/2020 1104   GFRNONAA >89 07/18/2017 0902   GFRAA 112 01/26/2020 1104   GFRAA >89 07/18/2017 0902    BNP No results found for: "BNP"  ProBNP No results found for: "PROBNP"  Imaging: MM 3D SCREEN BREAST BILATERAL  Result Date: 09/21/2022 CLINICAL DATA:  Screening. EXAM: DIGITAL SCREENING BILATERAL MAMMOGRAM WITH TOMOSYNTHESIS AND CAD TECHNIQUE: Bilateral screening  digital craniocaudal and mediolateral oblique mammograms were obtained. Bilateral screening digital breast tomosynthesis was performed. The images were evaluated with computer-aided detection. COMPARISON:  Previous exam(s). ACR Breast Density Category a: The breast tissue is almost entirely fatty. FINDINGS: There are no findings suspicious for malignancy. IMPRESSION: No mammographic evidence of malignancy. A result letter of this screening mammogram will be mailed directly to the patient. RECOMMENDATION: Screening mammogram in one year. (Code:SM-B-01Y) BI-RADS CATEGORY  1: Negative. Electronically Signed   By: Amie Portland M.D.   On: 09/21/2022 13:43     Assessment & Plan:   Anxiety state - hydrOXYzine (ATARAX) 25 MG tablet; TAKE 1 TABLET(25 MG) BY MOUTH AT BEDTIME AS NEEDED  Dispense: 90 tablet; Refill: 1 - LORazepam (ATIVAN) 0.5 MG tablet; Take 1 tablet (0.5 mg total) by mouth 2 (two) times daily as needed for anxiety.  Dispense: 30 tablet; Refill: 0  2. Essential hypertension  - atenolol (TENORMIN) 25 MG tablet; Take 1 tablet (25 mg total) by mouth daily.  Dispense: 30 tablet; Refill: 2 - CBC -  Comprehensive metabolic panel - Lipid Panel - TSH  3. Low back pain without sciatica, unspecified back pain laterality, unspecified chronicity   4. Mild intermittent asthma without complication  - fluticasone (FLOVENT HFA) 110 MCG/ACT inhaler; Inhale 2 puffs into the lungs daily as needed.  Dispense: 12 g; Refill: 11 - albuterol (VENTOLIN HFA) 108 (90 Base) MCG/ACT inhaler; Inhale 1 puff into the lungs every 4 (four) hours as needed for shortness of breath.  Dispense: 18 g; Refill: 11 - cetirizine (ZYRTEC) 10 MG tablet; TAKE 1 TABLET(10 MG) BY MOUTH AT BEDTIME  Dispense: 30 tablet; Refill: 3  5. PTSD (post-traumatic stress disorder)  - Ambulatory referral to Psychiatry  6. Anxiety  - Ambulatory referral to Psychiatry  7. Thyromegaly  - US THYROID - TSH  8. Routine adult health maintenance  - CBC - Comprehensive metabolic panel - Lipid Panel - TSH  9. Chronic right shoulder pain  - DG Shoulder Right  Follow up:  Follow up in 6 months     Ivonne Andrew, NP 10/13/2022

## 2022-10-12 LAB — CBC
Hematocrit: 40 % (ref 34.0–46.6)
Hemoglobin: 13.5 g/dL (ref 11.1–15.9)
MCH: 32 pg (ref 26.6–33.0)
MCHC: 33.8 g/dL (ref 31.5–35.7)
MCV: 95 fL (ref 79–97)
Platelets: 303 10*3/uL (ref 150–450)
RBC: 4.22 x10E6/uL (ref 3.77–5.28)
RDW: 12.5 % (ref 11.7–15.4)
WBC: 7.5 10*3/uL (ref 3.4–10.8)

## 2022-10-12 LAB — LIPID PANEL
Chol/HDL Ratio: 3 ratio (ref 0.0–4.4)
Cholesterol, Total: 277 mg/dL — ABNORMAL HIGH (ref 100–199)
HDL: 93 mg/dL (ref >39)
LDL Chol Calc (NIH): 164 mg/dL — ABNORMAL HIGH (ref 0–99)
Triglycerides: 120 mg/dL (ref 0–149)
VLDL Cholesterol Cal: 20 mg/dL (ref 5–40)

## 2022-10-12 LAB — COMPREHENSIVE METABOLIC PANEL
ALT: 22 IU/L (ref 0–32)
AST: 21 IU/L (ref 0–40)
Albumin/Globulin Ratio: 1.6 (ref 1.2–2.2)
Albumin: 4.2 g/dL (ref 3.8–4.9)
Alkaline Phosphatase: 84 IU/L (ref 44–121)
BUN/Creatinine Ratio: 14 (ref 12–28)
BUN: 11 mg/dL (ref 8–27)
Bilirubin Total: <0.2 mg/dL (ref 0.0–1.2)
CO2: 25 mmol/L (ref 20–29)
Calcium: 9.8 mg/dL (ref 8.7–10.3)
Chloride: 105 mmol/L (ref 96–106)
Creatinine, Ser: 0.78 mg/dL (ref 0.57–1.00)
Globulin, Total: 2.6 g/dL (ref 1.5–4.5)
Glucose: 95 mg/dL (ref 70–99)
Potassium: 5.3 mmol/L — ABNORMAL HIGH (ref 3.5–5.2)
Sodium: 141 mmol/L (ref 134–144)
Total Protein: 6.8 g/dL (ref 6.0–8.5)
eGFR: 87 mL/min/{1.73_m2} (ref >59)

## 2022-10-12 LAB — TSH: TSH: 0.796 u[IU]/mL (ref 0.450–4.500)

## 2022-10-13 ENCOUNTER — Encounter: Payer: Self-pay | Admitting: Nurse Practitioner

## 2022-10-13 NOTE — Assessment & Plan Note (Signed)
-   hydrOXYzine (ATARAX) 25 MG tablet; TAKE 1 TABLET(25 MG) BY MOUTH AT BEDTIME AS NEEDED  Dispense: 90 tablet; Refill: 1 - LORazepam (ATIVAN) 0.5 MG tablet; Take 1 tablet (0.5 mg total) by mouth 2 (two) times daily as needed for anxiety.  Dispense: 30 tablet; Refill: 0  2. Essential hypertension  - atenolol (TENORMIN) 25 MG tablet; Take 1 tablet (25 mg total) by mouth daily.  Dispense: 30 tablet; Refill: 2 - CBC - Comprehensive metabolic panel - Lipid Panel - TSH  3. Low back pain without sciatica, unspecified back pain laterality, unspecified chronicity   4. Mild intermittent asthma without complication  - fluticasone (FLOVENT HFA) 110 MCG/ACT inhaler; Inhale 2 puffs into the lungs daily as needed.  Dispense: 12 g; Refill: 11 - albuterol (VENTOLIN HFA) 108 (90 Base) MCG/ACT inhaler; Inhale 1 puff into the lungs every 4 (four) hours as needed for shortness of breath.  Dispense: 18 g; Refill: 11 - cetirizine (ZYRTEC) 10 MG tablet; TAKE 1 TABLET(10 MG) BY MOUTH AT BEDTIME  Dispense: 30 tablet; Refill: 3  5. PTSD (post-traumatic stress disorder)  - Ambulatory referral to Psychiatry  6. Anxiety  - Ambulatory referral to Psychiatry  7. Thyromegaly  - US THYROID - TSH  8. Routine adult health maintenance  - CBC - Comprehensive metabolic panel - Lipid Panel - TSH  9. Chronic right shoulder pain  - DG Shoulder Right  Follow up:  Follow up in 6 months

## 2022-10-25 ENCOUNTER — Ambulatory Visit: Admission: RE | Admit: 2022-10-25 | Payer: Medicare Other | Source: Ambulatory Visit

## 2022-11-06 ENCOUNTER — Other Ambulatory Visit: Payer: Self-pay

## 2022-11-06 DIAGNOSIS — J3089 Other allergic rhinitis: Secondary | ICD-10-CM

## 2022-11-06 DIAGNOSIS — J4520 Mild intermittent asthma, uncomplicated: Secondary | ICD-10-CM

## 2022-11-06 MED ORDER — OLOPATADINE HCL 0.2 % OP SOLN: 1.0000 [drp] | Freq: Every day | OPHTHALMIC | 0 refills | Status: DC | PRN

## 2022-11-06 MED ORDER — FLUTICASONE PROPIONATE 50 MCG/ACT NA SUSP: NASAL | 5 refills | Status: DC

## 2022-11-06 MED ORDER — FLUTICASONE PROPIONATE HFA 110 MCG/ACT IN AERO: 2.0000 | INHALATION_SPRAY | Freq: Every day | RESPIRATORY_TRACT | 11 refills | Status: DC | PRN

## 2022-11-07 ENCOUNTER — Other Ambulatory Visit: Payer: Self-pay

## 2022-11-07 DIAGNOSIS — J4520 Mild intermittent asthma, uncomplicated: Secondary | ICD-10-CM

## 2022-11-07 MED ORDER — ALBUTEROL SULFATE HFA 108 (90 BASE) MCG/ACT IN AERS: 1.0000 | INHALATION_SPRAY | RESPIRATORY_TRACT | 11 refills | Status: DC | PRN

## 2022-11-22 ENCOUNTER — Ambulatory Visit (INDEPENDENT_AMBULATORY_CARE_PROVIDER_SITE_OTHER): Payer: Medicare Other

## 2022-11-22 VITALS — Ht 64.0 in | Wt 209.0 lb

## 2022-11-22 DIAGNOSIS — N951 Menopausal and female climacteric states: Secondary | ICD-10-CM | POA: Diagnosis not present

## 2022-11-22 DIAGNOSIS — N959 Unspecified menopausal and perimenopausal disorder: Secondary | ICD-10-CM

## 2022-11-22 DIAGNOSIS — Z0000 Encounter for general adult medical examination without abnormal findings: Secondary | ICD-10-CM | POA: Diagnosis not present

## 2022-11-22 NOTE — Progress Notes (Addendum)
I connected with  Sheri Davis on 12/21/22 by a audio enabled telemedicine application and verified that I am speaking with the correct person using two identifiers.  Patient Location: Home  Provider Location: Home Office  I discussed the limitations of evaluation and management by telemedicine. The patient expressed understanding and agreed to proceed.  Subjective:   Sheri Davis is a 61 y.o. female who presents for Medicare Annual (Subsequent) preventive examination.  Review of Systems    Defer to pcp Cardiac Risk Factors include: advanced age (>17mn, >>17women);obesity (BMI >30kg/m2);sedentary lifestyle     Objective:    Today's Vitals   11/22/22 0827 11/22/22 0832 11/22/22 0834  Weight: 209 lb (94.8 kg)    Height: _0  (1.626 m)    PainSc:  8  8    Body mass index is 35.87 kg/m.     11/22/2022    8:41 AM 09/06/2022    1:30 PM 01/30/2017    8:35 AM 09/13/2016    8:35 AM 05/04/2016   11:33 AM 04/11/2016    9:38 AM 03/17/2016   10:19 AM  Advanced Directives  Does Patient Have a Medical Advance Directive? Yes Yes Yes  No No Yes  Type of [place] will Living will [place] will      Does patient want to make changes to medical advance directive? No - Patient declined        Copy of [place]? No - copy requested  Yes      Would patient like information on creating a medical advance directive?    Yes - Educational materials given       Current Medications (verified) Outpatient Encounter Medications as of 11/22/2022  Medication Sig   albuterol (VENTOLIN HFA) 108 (90 Base) MCG/ACT inhaler Inhale 1 puff into the lungs every 4 (four) hours as needed for shortness of breath.   atenolol (TENORMIN) 25 MG tablet Take 1 tablet (25 mg total) by mouth daily.   cetirizine (ZYRTEC) 10 MG tablet TAKE 1 TABLET(10 MG) BY MOUTH AT BEDTIME   cyclobenzaprine (FLEXERIL) 10  MG tablet Take 1 tablet (10 mg total) by mouth 3 (three) times daily as needed for muscle spasms.   diphenhydramine-acetaminophen (TYLENOL PM) 25-500 MG TABS tablet Take 1 tablet by mouth at bedtime as needed.   efavirenz-emtricitab-tenofovir (ATRIPLA) 600-200-300 MG tablet Take 1 tablet by mouth at bedtime.   fluticasone (FLONASE) 50 MCG/ACT nasal spray SHAKE LIQUID AND USE 1 SPRAY IN EACH NOSTRIL EVERY DAY   fluticasone (FLOVENT HFA) 110 MCG/ACT inhaler Inhale 2 puffs into the lungs daily as needed.   Fluticasone Propionate, Inhal, (FLOVENT IN) Inhale into the lungs.   LORazepam (ATIVAN) 0.5 MG tablet Take 1 tablet (0.5 mg total) by mouth 2 (two) times daily as needed for anxiety.   Olopatadine HCl 0.2 % SOLN Apply 1 drop to eye daily as needed.   triamcinolone cream (KENALOG) 0.1 % Apply 1 Application topically 2 (two) times daily.   hydrOXYzine (ATARAX) 25 MG tablet TAKE 1 TABLET(25 MG) BY MOUTH AT BEDTIME AS NEEDED (Patient not taking: Reported on 11/22/2022)   No facility-administered encounter medications on file as of 11/22/2022.    Allergies (verified) Patient has no known allergies.   History: Past Medical History:  Diagnosis Date   Allergy    Asthma    GERD (gastroesophageal reflux disease)    HIV infection ([place]    Hyperlipidemia  Tachycardia y-12   Pt was under stress at the time   Past Surgical History:  Procedure Laterality Date   COLONOSCOPY WITH PROPOFOL N/A 04/22/2013   Procedure: COLONOSCOPY WITH PROPOFOL;  Surgeon: Garlan Fair, MD;  Location: [facility];  Service: Endoscopy;  Laterality: N/A;   NO PAST SURGERIES     Family History  Problem Relation Age of Onset   Osteoarthritis Father    Diabetes Father    Hypertension Father    Goiter Father    Heart disease Mother    Depression Mother    Mental illness Mother    Anemia Mother    Hypokalemia Mother    Diabetes Brother    Breast cancer Neg Hx    Social History   Socioeconomic History    Marital status: Single    Spouse name: Not on file   Number of children: Not on file   Years of education: Not on file   Highest education level: Not on file  Occupational History   Not on file  Tobacco Use   Smoking status: Never   Smokeless tobacco: Never  Substance and Sexual Activity   Alcohol use: Yes    Comment: occasional   Drug use: No   Sexual activity: Never    Partners: Male    Comment: declined condoms  Other Topics Concern   Not on file  Social History Narrative   Not on file   Social Determinants of Health   Financial Resource Strain: Low Risk  (11/22/2022)   Overall Financial Resource Strain (CARDIA)    Difficulty of Paying Living Expenses: Not very hard  Food Insecurity: No Food Insecurity (11/22/2022)   Hunger Vital Sign    Worried About Running Out of Food in the Last Year: Never true    Ran Out of Food in the Last Year: Never true  Transportation Needs: No Transportation Needs (11/22/2022)   PRAPARE - Hydrologist (Medical): No    Lack of Transportation (Non-Medical): No  Physical Activity: Sufficiently Active (11/22/2022)   Exercise Vital Sign    Days of Exercise per Week: 7 days    Minutes of Exercise per Session: 60 min  Stress: Stress Concern Present (11/22/2022)   [place]    Feeling of Stress : To some extent  Social Connections: Moderately Integrated (11/22/2022)   Social Connection and Isolation Panel [NHANES]    Frequency of Communication with Friends and Family: More than three times a week    Frequency of Social Gatherings with Friends and Family: More than three times a week    Attends Religious Services: More than 4 times per year    Active Member of [employer] or Organizations: Yes    Attends Archivist Meetings: Never    Marital Status: Never married    Tobacco Counseling Counseling given: Not Answered   Clinical  Intake:  Pre-visit preparation completed: Yes  Pain : 0-10 Pain Score: 8  Pain Type: Chronic pain Pain Location: Knee Pain Descriptors / Indicators: Aching, Constant, Spasm Pain Onset: More than a month ago Pain Frequency: Constant        How often do you need to have someone help you when you read instructions, pamphlets, or other written materials from your doctor or pharmacy?: 1 - Never What is the last grade level you completed in school?: bachelors  Diabetic?no  Interpreter Needed?: No      Activities of Daily  Living    11/22/2022    8:42 AM 09/06/2022    1:29 PM  In your present state of health, do you have any difficulty performing the following activities:  Hearing? 0 0  Vision? 0 0  Difficulty concentrating or making decisions? 0 0  Walking or climbing stairs? 0 0  Dressing or bathing? 0 0  Doing errands, shopping? 0 0  Preparing Food and eating ? N   Using the Toilet? N   In the past six months, have you accidently leaked urine? N   Do you have problems with loss of bowel control? N   Managing your Medications? N   Managing your Finances? N   Housekeeping or managing your Housekeeping? N     Patient Care Team: Fenton Foy, NP as PCP - General (Pulmonary Disease) Campbell Riches, MD as Consulting Physician (Infectious Diseases) Rico Junker, RN as Registered Nurse Theodore Demark, RN (Inactive) as Registered Nurse  Indicate any recent Medical Services you may have received from other than [facility] providers in the past year (date may be approximate).     Assessment:   This is a routine wellness examination for Sheri Davis.  Hearing/Vision screen No results found.  Dietary issues and exercise activities discussed: Current Exercise Habits: Home exercise routine, Type of exercise: walking, Time (Minutes): 60, Frequency (Times/Week): 7, Weekly Exercise (Minutes/Week): 420, Intensity: Mild, Exercise limited by: orthopedic condition(s)   Goals  Addressed             This Visit's Progress    Weight (lb) < 200 lb (90.7 kg)   209 lb (94.8 kg)      Depression Screen    11/22/2022    8:40 AM 10/11/2022   11:04 AM 09/06/2022    1:29 PM 08/30/2022    8:49 AM 01/13/2021    3:04 PM 03/30/2020    3:41 PM 10/09/2018   10:39 AM  PHQ 2/9 Scores  PHQ - 2 Score 0 1 0 0 0 0 0  PHQ- 9 Score 0 2         Fall Risk    11/22/2022    8:41 AM 09/06/2022    1:29 PM 08/30/2022    8:49 AM 01/13/2021    3:04 PM 03/30/2020    3:41 PM  [place] in the past year? 0 0 0 0 0  Number falls in past yr: 0 0 0    Injury with Fall? 0 0 0    Risk for fall due to : No Fall Risks No Fall Risks     Follow up Falls evaluation completed Falls evaluation completed;Falls prevention discussed  Falls evaluation completed Falls evaluation completed    FALL RISK PREVENTION PERTAINING TO THE HOME:  Any stairs in or around the home? No  If so, are there any without handrails?  N/a Home free of loose throw rugs in walkways, pet beds, electrical cords, etc? No  Adequate lighting in your home to reduce risk of falls? Yes   ASSISTIVE DEVICES UTILIZED TO PREVENT FALLS:  Life alert? No  Use of a cane, walker or w/c? No  Grab bars in the bathroom? Yes  Shower chair or bench in shower? No  Elevated toilet seat or a handicapped toilet? No   TIMED UP AND GO:  Was the test performed? No .  Length of time to ambulate 10 feet:  sec.     Cognitive Function:  11/22/2022    8:42 AM  6CIT Screen  What Year? 0 points  What month? 0 points  What time? 0 points  Count back from 20 0 points  Months in reverse 0 points  Repeat phrase 0 points  Total Score 0 points    Immunizations Immunization History  Administered Date(s) Administered   Influenza Split 09/08/2011, 08/14/2012   Influenza Whole 08/28/2007, 09/09/2007, 09/14/2008, 09/24/2009, 09/12/2010   Influenza,inj,Quad PF,6+ Mos 08/26/2014, 08/18/2015, 09/13/2016, 07/18/2017,  08/15/2018, 08/21/2019, 09/13/2020, 08/19/2021, 08/30/2022   Influenza-Unspecified 08/25/2013   PFIZER(Purple Top)SARS-COV-2 Vaccination 12/03/2019, 12/24/2019, 09/13/2020   PPD Test 01/29/2015, 02/04/2016, 07/01/2016, 11/16/2017, 12/01/2017   Pneumococcal Conjugate-13 04/29/2015   Pneumococcal Polysaccharide-23 08/28/2007, 01/29/2012, 01/13/2021   Tdap 08/26/2014    TDAP status: Up to date  Flu Vaccine status: Up to date  Pneumococcal vaccine status: Up to date  Covid-19 vaccine status: Declined, Education has been provided regarding the importance of this vaccine but patient still declined. Advised may receive this vaccine at local pharmacy or Health Dept.or vaccine clinic. Aware to provide a copy of the vaccination record if obtained from local pharmacy or Health Dept. Verbalized acceptance and understanding.  Qualifies for Shingles Vaccine? Yes   Zostavax completed No   Shingrix Completed?: No.    Education has been provided regarding the importance of this vaccine. Patient has been advised to call insurance company to determine out of pocket expense if they have not yet received this vaccine. Advised may also receive vaccine at local pharmacy or Health Dept. Verbalized acceptance and understanding.  Screening Tests Health Maintenance  Topic Date Due   Zoster Vaccines- Shingrix (1 of 2) Never done   Diabetic kidney evaluation - Urine ACR  02/26/2016   COVID-19 Vaccine (4 - 2023-24 season) 07/28/2022   PAP SMEAR-Modifier  08/20/2022   COLONOSCOPY (Pts 45-72yr Insurance coverage will need to be confirmed)  04/23/2023   MAMMOGRAM  09/21/2023   Diabetic kidney evaluation - eGFR measurement  10/12/2023   LIPID PANEL  10/12/2023   Medicare Annual Wellness (AWV)  11/23/2023   DTaP/Tdap/Td (2 - Td or Tdap) 08/26/2024   Hepatitis C Screening  Completed   HIV Screening  Completed   HPV VACCINES  Aged Out   FOOT EXAM  Discontinued   HEMOGLOBIN A1C  Discontinued   OPHTHALMOLOGY EXAM   Discontinued    Health Maintenance  Health Maintenance Due  Topic Date Due   Zoster Vaccines- Shingrix (1 of 2) Never done   Diabetic kidney evaluation - Urine ACR  02/26/2016   COVID-19 Vaccine (4 - 2023-24 season) 07/28/2022   PAP SMEAR-Modifier  08/20/2022    Colorectal cancer screening: Type of screening: Colonoscopy. Completed 04/22/2013. Repeat every 10 years  Mammogram status: Completed [id number]. Repeat every year  Bone Density status: Ordered 11/22/2022. Pt provided with contact info and advised to call to schedule appt.  Lung Cancer Screening: (Low Dose CT Chest recommended if Age 60-80years, 30 pack-year currently smoking OR have quit w/in 15years.) does not qualify.   Lung Cancer Screening Referral: n/a  Additional Screening:  Hepatitis C Screening: does not qualify; Completed 03/07/2007  Vision Screening: Recommended annual ophthalmology exams for early detection of glaucoma and other disorders of the eye. Is the patient up to date with their annual eye exam?  Yes  Who is the provider or what is the name of the office in which the patient attends annual eye exams? [place] is not established with a provider, would they like to  be referred to a provider to establish care?  N/a .   Dental Screening: Recommended annual dental exams for proper oral hygiene  Community Resource Referral / Chronic Care Management: CRR required this visit?  No   CCM required this visit?  No      Plan:     I have personally reviewed and noted the following in the patient's chart:   Medical and social history Use of alcohol, tobacco or illicit drugs  Current medications and supplements including opioid prescriptions. Patient is not currently taking opioid prescriptions. Functional ability and status Nutritional status Physical activity Advanced directives List of other physicians Hospitalizations, surgeries, and ER visits in previous 12 months Vitals Screenings to  include cognitive, depression, and falls Referrals and appointments  In addition, I have reviewed and discussed with patient certain preventive protocols, quality metrics, and best practice recommendations. A written personalized care plan for preventive services as well as general preventive health recommendations were provided to patient.     Angus Seller, CMA   11/22/2022   Nurse Notes: Non face to face 30 minutes. Discussed goodrx and sent her link to get atarax and olopatadine drops cheaper, where she couldn't afford it before. She is going to continue to work on weight loss.    I have reviewed and agree with above annual wellness documentation  Attestation signed by Fenton Foy at 12/14/22

## 2022-11-22 NOTE — Addendum Note (Signed)
Addended by: Jama Flavors on: 11/22/2022 10:19 AM   Modules accepted: Orders

## 2022-12-08 ENCOUNTER — Other Ambulatory Visit: Payer: Self-pay

## 2022-12-08 MED ORDER — CYCLOBENZAPRINE HCL 10 MG PO TABS: 10.0000 mg | ORAL_TABLET | Freq: Three times a day (TID) | ORAL | 0 refills | Status: DC | PRN

## 2022-12-08 NOTE — Telephone Encounter (Signed)
From: [name] To: Office of Fenton Foy, NP Sent: 12/07/2022 7:42 PM EST Subject: Medication Renewal Request  Refills have been requested for the following medications:   cyclobenzaprine (FLEXERIL) 10 MG tablet Kenney Houseman S Nichols]  Patient Comment: Need for lower back pain and neck and shoulder   Preferred pharmacy: [facility] #[id number] Phillip Heal, [place] AT [address] Delivery method: [employer]

## 2022-12-11 MED ORDER — CYCLOBENZAPRINE HCL 10 MG PO TABS: 10.0000 mg | ORAL_TABLET | Freq: Three times a day (TID) | ORAL | 0 refills | Status: DC | PRN

## 2022-12-25 ENCOUNTER — Telehealth: Payer: Self-pay

## 2022-12-25 NOTE — Telephone Encounter (Signed)
Patient LMOM asking for a referral to ortho so she can get cortisone shots in her knee. This is something she has had in the past for her Arthritis and she would like to see them again. Please advise.

## 2022-12-27 DIAGNOSIS — Z0100 Encounter for examination of eyes and vision without abnormal findings: Secondary | ICD-10-CM | POA: Diagnosis not present

## 2022-12-27 DIAGNOSIS — H524 Presbyopia: Secondary | ICD-10-CM | POA: Diagnosis not present

## 2023-01-03 DIAGNOSIS — M170 Bilateral primary osteoarthritis of knee: Secondary | ICD-10-CM | POA: Diagnosis not present

## 2023-01-09 ENCOUNTER — Other Ambulatory Visit: Payer: Self-pay | Admitting: Nurse Practitioner

## 2023-01-09 NOTE — Telephone Encounter (Signed)
Caller & Relationship to patient:  MRN #  [id number]   Call Back Number:   Date of Last Office Visit: 12/25/2022     Date of Next Office Visit: 04/11/2023    Medication(s) to be Refilled:  see below  Preferred Pharmacy:   ** Please notify patient to allow 48-72 hours to process** **Let patient know to contact pharmacy at the end of the day to make sure medication is ready. ** **If patient has not been seen in a year or longer, book an appointment **Advise to use MyChart for refill requests OR to contact their pharmacy

## 2023-01-10 ENCOUNTER — Ambulatory Visit: Payer: Self-pay | Admitting: Nurse Practitioner

## 2023-01-10 DIAGNOSIS — M170 Bilateral primary osteoarthritis of knee: Secondary | ICD-10-CM | POA: Diagnosis not present

## 2023-01-11 ENCOUNTER — Telehealth: Payer: Self-pay

## 2023-01-11 NOTE — Telephone Encounter (Signed)
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 Patient is not having any symptoms at this time. Would like to know if she should be tested for flu or if she needs to start something. States her patient's daughter was started on tamiflu by PCP. Would like to know if she should start this as well. Will forward message to provider. Advised patient to keep eye out for any flu like symptoms. If she starts developing symptoms to inform PCP for testing. Leatrice Jewels, RMA

## 2023-01-11 NOTE — Telephone Encounter (Signed)
Per MD patient will need to call PCP to discuss post exposure prophylaxis. 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 Verbalized understanding.   Leatrice Jewels, RMA

## 2023-01-16 ENCOUNTER — Other Ambulatory Visit: Payer: Self-pay

## 2023-01-16 DIAGNOSIS — F411 Generalized anxiety disorder: Secondary | ICD-10-CM

## 2023-01-16 NOTE — Telephone Encounter (Signed)
From: [name] To: Office of Fenton Foy, NP Sent: 01/14/2023 3:54 PM EST Subject: Medication Renewal Request  Refills have been requested for the following medications:   LORazepam (ATIVAN) 0.5 MG tablet Sheri Davis]  Preferred pharmacy: [facility] [id number] Phillip Heal, [place] AT [facility] OF [address] [facility] Delivery method: [employer]

## 2023-01-16 NOTE — Telephone Encounter (Signed)
Please advise KH 

## 2023-01-17 ENCOUNTER — Ambulatory Visit: Payer: Medicare Other | Admitting: Psychiatry

## 2023-01-17 DIAGNOSIS — M170 Bilateral primary osteoarthritis of knee: Secondary | ICD-10-CM | POA: Diagnosis not present

## 2023-01-17 MED ORDER — LORAZEPAM 0.5 MG PO TABS: 0.5000 mg | ORAL_TABLET | Freq: Two times a day (BID) | ORAL | 0 refills | Status: DC | PRN

## 2023-01-24 DIAGNOSIS — M170 Bilateral primary osteoarthritis of knee: Secondary | ICD-10-CM | POA: Diagnosis not present

## 2023-02-13 ENCOUNTER — Other Ambulatory Visit: Payer: Self-pay

## 2023-02-13 DIAGNOSIS — Z113 Encounter for screening for infections with a predominantly sexual mode of transmission: Secondary | ICD-10-CM

## 2023-02-13 DIAGNOSIS — B20 Human immunodeficiency virus [HIV] disease: Secondary | ICD-10-CM

## 2023-02-14 ENCOUNTER — Other Ambulatory Visit: Payer: Self-pay

## 2023-02-14 ENCOUNTER — Ambulatory Visit: Payer: Medicare HMO

## 2023-02-14 ENCOUNTER — Other Ambulatory Visit: Payer: Medicare HMO

## 2023-02-14 DIAGNOSIS — B20 Human immunodeficiency virus [HIV] disease: Secondary | ICD-10-CM | POA: Diagnosis not present

## 2023-02-14 DIAGNOSIS — Z79899 Other long term (current) drug therapy: Secondary | ICD-10-CM | POA: Diagnosis not present

## 2023-02-15 LAB — T-HELPER CELL (CD4) - (RCID CLINIC ONLY)
CD4 % Helper T Cell: 44 % (ref 33–65)
CD4 T Cell Abs: 1322 /uL (ref 400–1790)

## 2023-02-16 LAB — COMPLETE METABOLIC PANEL WITH GFR
AG Ratio: 1.5 (calc) (ref 1.0–2.5)
ALT: 25 U/L (ref 6–29)
AST: 21 U/L (ref 10–35)
Albumin: 4.1 g/dL (ref 3.6–5.1)
Alkaline phosphatase (APISO): 96 U/L (ref 37–153)
BUN: 19 mg/dL (ref 7–25)
CO2: 24 mmol/L (ref 20–32)
Calcium: 9.3 mg/dL (ref 8.6–10.4)
Chloride: 106 mmol/L (ref 98–110)
Creat: 0.69 mg/dL (ref 0.50–1.05)
Globulin: 2.8 g/dL (calc) (ref 1.9–3.7)
Glucose, Bld: 96 mg/dL (ref 65–99)
Potassium: 4.7 mmol/L (ref 3.5–5.3)
Sodium: 139 mmol/L (ref 135–146)
Total Bilirubin: 0.2 mg/dL (ref 0.2–1.2)
Total Protein: 6.9 g/dL (ref 6.1–8.1)
eGFR: 99 mL/min/{1.73_m2} (ref >=60)

## 2023-02-16 LAB — CBC WITH DIFFERENTIAL/PLATELET
Absolute Monocytes: 429 cells/uL (ref 200–950)
Basophils Absolute: 32 cells/uL (ref 0–200)
Basophils Relative: 0.4 %
Eosinophils Absolute: 300 cells/uL (ref 15–500)
Eosinophils Relative: 3.7 %
HCT: 38.9 % (ref 35.0–45.0)
Hemoglobin: 13.5 g/dL (ref 11.7–15.5)
Lymphs Abs: 3426 cells/uL (ref 850–3900)
MCH: 32.5 pg (ref 27.0–33.0)
MCHC: 34.7 g/dL (ref 32.0–36.0)
MCV: 93.5 fL (ref 80.0–100.0)
MPV: 10.4 fL (ref 7.5–12.5)
Monocytes Relative: 5.3 %
Neutro Abs: 3912 cells/uL (ref 1500–7800)
Neutrophils Relative %: 48.3 %
Platelets: 333 10*3/uL (ref 140–400)
RBC: 4.16 10*6/uL (ref 3.80–5.10)
RDW: 11.9 % (ref 11.0–15.0)
Total Lymphocyte: 42.3 %
WBC: 8.1 10*3/uL (ref 3.8–10.8)

## 2023-02-16 LAB — LIPID PANEL
Cholesterol: 271 mg/dL — ABNORMAL HIGH (ref <200)
HDL: 106 mg/dL (ref >=50)
LDL Cholesterol (Calc): 134 mg/dL (calc) — ABNORMAL HIGH
Non-HDL Cholesterol (Calc): 165 mg/dL (calc) — ABNORMAL HIGH (ref <130)
Total CHOL/HDL Ratio: 2.6 (calc) (ref <5.0)
Triglycerides: 168 mg/dL — ABNORMAL HIGH (ref <150)

## 2023-02-16 LAB — HIV-1 RNA QUANT-NO REFLEX-BLD
HIV 1 RNA Quant: NOT DETECTED Copies/mL
HIV-1 RNA Quant, Log: NOT DETECTED Log cps/mL

## 2023-02-23 ENCOUNTER — Other Ambulatory Visit: Payer: Self-pay | Admitting: Nurse Practitioner

## 2023-02-26 ENCOUNTER — Other Ambulatory Visit: Payer: Self-pay

## 2023-02-26 MED ORDER — CYCLOBENZAPRINE HCL 10 MG PO TABS: 10.0000 mg | ORAL_TABLET | Freq: Three times a day (TID) | ORAL | 0 refills | Status: DC | PRN

## 2023-02-26 NOTE — Telephone Encounter (Signed)
From: [name] To: Office of Fenton Foy, NP Sent: 02/25/2023 8:07 PM EDT Subject: Medication Renewal Request  Refills have been requested for the following medications:   cyclobenzaprine (FLEXERIL) 10 MG tablet Kenney Houseman S [place]]  Preferred pharmacy: [facility] [id number] Phillip Heal, [place] AT [facility] OF [address] [facility] Delivery method: [employer]

## 2023-02-26 NOTE — Telephone Encounter (Signed)
Please advise KH 

## 2023-02-28 ENCOUNTER — Ambulatory Visit: Payer: Medicare Other | Admitting: Infectious Diseases

## 2023-03-06 ENCOUNTER — Other Ambulatory Visit: Payer: Self-pay

## 2023-03-06 DIAGNOSIS — J4520 Mild intermittent asthma, uncomplicated: Secondary | ICD-10-CM

## 2023-03-06 MED ORDER — CETIRIZINE HCL 10 MG PO TABS: ORAL_TABLET | ORAL | 3 refills | Status: DC

## 2023-03-07 ENCOUNTER — Other Ambulatory Visit: Payer: Medicare Other

## 2023-03-21 ENCOUNTER — Ambulatory Visit (INDEPENDENT_AMBULATORY_CARE_PROVIDER_SITE_OTHER): Payer: Medicare HMO | Admitting: Psychiatry

## 2023-03-21 ENCOUNTER — Ambulatory Visit: Payer: Medicare Other | Admitting: Infectious Diseases

## 2023-03-21 ENCOUNTER — Encounter: Payer: Self-pay | Admitting: Psychiatry

## 2023-03-21 ENCOUNTER — Encounter: Payer: Medicare Other | Admitting: Infectious Diseases

## 2023-03-21 ENCOUNTER — Other Ambulatory Visit: Payer: Self-pay

## 2023-03-21 VITALS — BP 133/89 | HR 61 | Temp 97.3°F | Ht 64.0 in | Wt 218.4 lb

## 2023-03-21 DIAGNOSIS — F3342 Major depressive disorder, recurrent, in full remission: Secondary | ICD-10-CM

## 2023-03-21 DIAGNOSIS — F4310 Post-traumatic stress disorder, unspecified: Secondary | ICD-10-CM

## 2023-03-21 DIAGNOSIS — G4700 Insomnia, unspecified: Secondary | ICD-10-CM | POA: Diagnosis not present

## 2023-03-21 DIAGNOSIS — F411 Generalized anxiety disorder: Secondary | ICD-10-CM

## 2023-03-21 DIAGNOSIS — Z79899 Other long term (current) drug therapy: Secondary | ICD-10-CM

## 2023-03-21 MED ORDER — HYDROXYZINE HCL 25 MG PO TABS
37.5000 mg | ORAL_TABLET | Freq: Every evening | ORAL | 0 refills | Status: DC | PRN
Start: 2023-03-21 — End: 2023-06-18

## 2023-03-21 NOTE — Telephone Encounter (Signed)
From: [name] To: Office of Ivonne Andrew, NP Sent: 03/21/2023 12:22 PM EDT Subject: Medication Renewal Request  Refills have been requested for the following medications:   LORazepam (ATIVAN) 0.5 MG tablet Archie Patten S [place]]  Preferred pharmacy: [facility] #[id number] Cheree Ditto, [place] - [address] AT [facility] [address] [facility] Delivery method: [employer]

## 2023-03-21 NOTE — Telephone Encounter (Signed)
Please advise. kh 

## 2023-03-21 NOTE — Telephone Encounter (Signed)
Please advise KH 

## 2023-03-21 NOTE — Patient Instructions (Addendum)
[email or web address] / Ardeth Perfect. [facility] [address], [place], [place] [address]  [phone]   [email or web address] [address] LCSW [address] [employer], [place] [address]  [phone]    [email or web address]  www.[email or web address]  [employer] [email or web address] [address] [place], [place] [address]  [phone]  [facility], [facility] [email or web address] [address] [place], [place] [address]  [phone]   [facility] - [phone]  [facility] - [phone]  [facility] - [phone] counseling [phone]  [facility] [phone]   [email or web address] this clinician can offer telehealth and has a sliding scale option  [email or web address] this group also offers sliding scale rates and is based out of [place]  Dr. Liborio Nixon with the [facility] specializes in divorce  [employer] [facility] has interns who offer sliding scale rates and some of the full time clinicians do, as well. You complete their contact form on their website and the referrals coordinator will help to get connected to someone    Duloxetine Delayed-Release Capsules What is this medication? DULOXETINE (doo LOX e teen) treats depression, anxiety, fibromyalgia, and certain types of chronic pain such as nerve, bone, or joint pain. It increases the amount of serotonin and norepinephrine in the brain, hormones that help regulate mood and pain. It belongs to a group of medications called SNRIs. This medicine may be used for other purposes; ask your health care provider or pharmacist if you have questions. COMMON BRAND NAME(S): Cymbalta, Drizalma, Irenka What should I tell my care team before I take this medication? They need to know if you have any of  these conditions: Bipolar disorder Glaucoma High blood pressure Kidney disease Liver disease Seizures Suicidal thoughts, plans or attempt; a previous suicide attempt by you or a family member Take medications that treat or prevent blood clots Taken medications called MAOIs like Carbex, Eldepryl, Marplan, Nardil, and Parnate within 14 days Trouble passing urine An unusual reaction to duloxetine, other medications, foods, dyes, or preservatives Pregnant or trying to get pregnant Breast-feeding How should I use this medication? Take this medication by mouth with a glass of water. Follow the directions on the prescription label. Do not crush, cut or chew some capsules of this medication. Some capsules may be opened and sprinkled on applesauce. Check with your care team or pharmacist if you are not sure. You can take this medication with or without food. Take your medication at regular intervals. Do not take your medication more often than directed. Do not stop taking this medication suddenly except upon the advice of your care team. Stopping this medication too quickly may cause serious side effects or your condition may worsen. A special MedGuide will be given to you by the pharmacist with each prescription and refill. Be sure to read this information carefully each time. Talk to your care team regarding the use of this medication in children. While this medication may be prescribed for children as young as 52 years of age for selected conditions, precautions do apply. Overdosage: If you think you have taken too much of this medicine contact a poison control center or emergency room at once. NOTE: This medicine is only for you. Do not share this medicine with others. What if I miss a dose? If you miss a dose, take it as soon as you can. If it is almost time for your next  dose, take only that dose. Do not take double or extra doses. What may interact with this medication? Do not take this  medication with any of the following: Desvenlafaxine Levomilnacipran Linezolid MAOIs like Carbex, Eldepryl, Emsam, Marplan, Nardil, and Parnate Methylene blue (injected into a vein) Milnacipran Safinamide Thioridazine Venlafaxine Viloxazine This medication may also interact with the following: Alcohol Amphetamines Aspirin and aspirin-like medications Certain antibiotics like ciprofloxacin and enoxacin Certain medications for blood pressure, heart disease, irregular heart beat Certain medications for depression, anxiety, or psychotic disturbances Certain medications for migraine headache like almotriptan, eletriptan, frovatriptan, naratriptan, rizatriptan, sumatriptan, zolmitriptan Certain medications that treat or prevent blood clots like warfarin, enoxaparin, and dalteparin Cimetidine Fentanyl Lithium NSAIDS, medications for pain and inflammation, like ibuprofen or naproxen Phentermine Procarbazine Rasagiline Sibutramine St. John's wort Theophylline Tramadol Tryptophan This list may not describe all possible interactions. Give your health care provider a list of all the medicines, herbs, non-prescription drugs, or dietary supplements you use. Also tell them if you smoke, drink alcohol, or use illegal drugs. Some items may interact with your medicine. What should I watch for while using this medication? Tell your care team if your symptoms do not get better or if they get worse. Visit your care team for regular checks on your progress. Because it may take several weeks to see the full effects of this medication, it is important to continue your treatment as prescribed by your care team. This medication may cause serious skin reactions. They can happen weeks to months after starting the medication. Contact your care team right away if you notice fevers or flu-like symptoms with a rash. The rash may be red or purple and then turn into blisters or peeling of the skin. Or, you might  notice a red rash with swelling of the face, lips, or lymph nodes in your neck or under your arms. Watch for new or worsening thoughts of suicide or depression. This includes sudden changes in mood, behaviors, or thoughts. These changes can happen at any time but are more common in the beginning of treatment or after a change in dose. Call your care team right away if you experience these thoughts or worsening depression. Manic episodes may happen in patients with bipolar disorder who take this medication. Watch for changes in feelings or behaviors such as feeling anxious, nervous, agitated, panicky, irritable, hostile, aggressive, impulsive, severely restless, overly excited and hyperactive, or trouble sleeping. These symptoms can happen at any time, but are more common in the beginning of treatment or after a change in dose. Call your care team right away if you notice any of these symptoms. You may get drowsy or dizzy. Do not drive, use machinery, or do anything that needs mental alertness until you know how this medication affects you. Do not stand or sit up quickly, especially if you are an older patient. This reduces the risk of dizzy or fainting spells. Alcohol may interfere with the effect of this medication. Avoid alcoholic drinks. This medication may increase blood sugar. The risk may be higher in patients who already have diabetes. Ask your care team what you can do to lower your risk of diabetes while taking this medication. This medication can cause an increase in blood pressure. This medication can also cause a sudden drop in your blood pressure, which may make you feel faint and increase the chance of a fall. These effects are most common when you first start the medication or when the dose is increased, or during  use of other medications that can cause a sudden drop in blood pressure. Check with your care team for instructions on monitoring your blood pressure while taking this medication. Your  mouth may get dry. Chewing sugarless gum or sucking hard candy, and drinking plenty of water, may help. Contact your care team if the problem does not go away or is severe. What side effects may I notice from receiving this medication? Side effects that you should report to your care team as soon as possible: Allergic reactions--skin rash, itching, hives, swelling of the face, lips, tongue, or throat Bleeding--bloody or black, tar-like stools, red or dark brown urine, vomiting blood or brown material that looks like coffee grounds, small, red or purple spots on skin, unusual bleeding or bruising Increase in blood pressure Liver injury--right upper belly pain, loss of appetite, nausea, light-colored stool, dark yellow or brown urine, yellowing skin or eyes, unusual weakness or fatigue Low sodium level--muscle weakness, fatigue, dizziness, headache, confusion Redness, blistering, peeling, or loosening of the skin, including inside the mouth Serotonin syndrome--irritability, confusion, fast or irregular heartbeat, muscle stiffness, twitching muscles, sweating, high fever, seizures, chills, vomiting, diarrhea Sudden eye pain or change in vision such as blurry vision, seeing halos around lights, vision loss Thoughts of suicide or self-harm, worsening mood, feelings of depression Trouble passing urine Side effects that usually do not require medical attention (report to your care team if they continue or are bothersome): Change in sex drive or performance Constipation Diarrhea Dizziness Dry mouth Excessive sweating Loss of appetite Nausea Vomiting This list may not describe all possible side effects. Call your doctor for medical advice about side effects. You may report side effects to FDA at 1-800-FDA-1088. Where should I keep my medication? Keep out of the reach of children and pets. Store at room temperature between 15 and 30 degrees C (59 to 86 degrees F). Get rid of any unused medication  after the expiration date. To get rid of medications that are no longer needed or have expired: Take the medication to a medication take-back program. Check with your pharmacy or law enforcement to find a location. If you cannot return the medication, check the label or package insert to see if the medication should be thrown out in the garbage or flushed down the toilet. If you are not sure, ask your care team. If it is safe to put it in the trash, take the medication out of the container. Mix the medication with cat litter, dirt, coffee grounds, or other unwanted substance. Seal the mixture in a bag or container. Put it in the trash. NOTE: This sheet is a summary. It may not cover all possible information. If you have questions about this medicine, talk to your doctor, pharmacist, or health care provider.  2023 Elsevier/Gold Standard (2020-11-01 00:00:00)
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
## 2023-03-26 ENCOUNTER — Other Ambulatory Visit: Payer: Self-pay | Admitting: Nurse Practitioner

## 2023-03-26 DIAGNOSIS — F411 Generalized anxiety disorder: Secondary | ICD-10-CM

## 2023-03-27 NOTE — Telephone Encounter (Signed)
Please advise KH 

## 2023-04-03 ENCOUNTER — Other Ambulatory Visit: Payer: Self-pay

## 2023-04-03 DIAGNOSIS — I10 Essential (primary) hypertension: Secondary | ICD-10-CM

## 2023-04-03 MED ORDER — ATENOLOL 25 MG PO TABS
25.0000 mg | ORAL_TABLET | Freq: Every day | ORAL | 2 refills | Status: DC
Start: 2023-04-03 — End: 2023-04-11

## 2023-04-11 ENCOUNTER — Other Ambulatory Visit: Payer: Self-pay

## 2023-04-11 ENCOUNTER — Encounter: Payer: Self-pay | Admitting: Nurse Practitioner

## 2023-04-11 ENCOUNTER — Ambulatory Visit (INDEPENDENT_AMBULATORY_CARE_PROVIDER_SITE_OTHER): Payer: Medicare HMO | Admitting: Nurse Practitioner

## 2023-04-11 VITALS — BP 129/70 | HR 63 | Temp 97.1°F | Wt 213.8 lb

## 2023-04-11 DIAGNOSIS — I10 Essential (primary) hypertension: Secondary | ICD-10-CM

## 2023-04-11 DIAGNOSIS — R7303 Prediabetes: Secondary | ICD-10-CM | POA: Diagnosis not present

## 2023-04-11 DIAGNOSIS — J4520 Mild intermittent asthma, uncomplicated: Secondary | ICD-10-CM | POA: Diagnosis not present

## 2023-04-11 DIAGNOSIS — J069 Acute upper respiratory infection, unspecified: Secondary | ICD-10-CM

## 2023-04-11 DIAGNOSIS — E538 Deficiency of other specified B group vitamins: Secondary | ICD-10-CM | POA: Diagnosis not present

## 2023-04-11 DIAGNOSIS — M170 Bilateral primary osteoarthritis of knee: Secondary | ICD-10-CM | POA: Diagnosis not present

## 2023-04-11 DIAGNOSIS — M25462 Effusion, left knee: Secondary | ICD-10-CM | POA: Diagnosis not present

## 2023-04-11 DIAGNOSIS — E559 Vitamin D deficiency, unspecified: Secondary | ICD-10-CM

## 2023-04-11 MED ORDER — FLUTICASONE PROPIONATE HFA 110 MCG/ACT IN AERO
2.0000 | INHALATION_SPRAY | Freq: Every day | RESPIRATORY_TRACT | 11 refills | Status: DC | PRN
Start: 2023-04-11 — End: 2023-09-21

## 2023-04-11 MED ORDER — FLUTICASONE PROPIONATE 50 MCG/ACT NA SUSP
NASAL | 5 refills | Status: DC
Start: 2023-04-11 — End: 2023-10-17

## 2023-04-11 MED ORDER — AMOXICILLIN-POT CLAVULANATE 875-125 MG PO TABS
1.0000 | ORAL_TABLET | Freq: Two times a day (BID) | ORAL | 0 refills | Status: DC
Start: 2023-04-11 — End: 2023-09-21

## 2023-04-11 MED ORDER — GUAIFENESIN ER 600 MG PO TB12
600.0000 mg | ORAL_TABLET | Freq: Two times a day (BID) | ORAL | 0 refills | Status: AC
Start: 2023-04-11 — End: 2023-04-18

## 2023-04-11 MED ORDER — ATENOLOL 25 MG PO TABS
25.0000 mg | ORAL_TABLET | Freq: Every day | ORAL | 2 refills | Status: DC
Start: 2023-04-11 — End: 2023-08-31

## 2023-04-11 NOTE — Patient Instructions (Addendum)
1. Prediabetes  - POCT glycosylated hemoglobin (Hb A1C)  2. Mild intermittent asthma without complication  - fluticasone (FLOVENT HFA) 110 MCG/ACT inhaler; Inhale 2 puffs into the lungs daily as needed.  Dispense: 12 g; Refill: 11 - fluticasone (FLONASE) 50 MCG/ACT nasal spray; SHAKE LIQUID AND USE 1 SPRAY IN EACH NOSTRIL EVERY DAY  Dispense: 16 g; Refill: 5  3. Essential hypertension  - atenolol (TENORMIN) 25 MG tablet; Take 1 tablet (25 mg total) by mouth daily.  Dispense: 30 tablet; Refill: 2  4. Vitamin B12 deficiency  - Vitamin B12  5. Vitamin D deficiency  - Vitamin D, 25-hydroxy  6. Upper respiratory tract infection, unspecified type  - amoxicillin-clavulanate (AUGMENTIN) 875-125 MG tablet; Take 1 tablet by mouth 2 (two) times daily.  Dispense: 20 tablet; Refill: 0 - guaiFENesin (MUCINEX) 600 MG 12 hr tablet; Take 1 tablet (600 mg total) by mouth 2 (two) times daily for 7 days.  Dispense: 14 tablet; Refill: 0   Follow up:  Follow up in 6 months

## 2023-04-11 NOTE — Assessment & Plan Note (Signed)
-   POCT glycosylated hemoglobin (Hb A1C)  2. Mild intermittent asthma without complication  - fluticasone (FLOVENT HFA) 110 MCG/ACT inhaler; Inhale 2 puffs into the lungs daily as needed.  Dispense: 12 g; Refill: 11 - fluticasone (FLONASE) 50 MCG/ACT nasal spray; SHAKE LIQUID AND USE 1 SPRAY IN EACH NOSTRIL EVERY DAY  Dispense: 16 g; Refill: 5  3. Essential hypertension  - atenolol (TENORMIN) 25 MG tablet; Take 1 tablet (25 mg total) by mouth daily.  Dispense: 30 tablet; Refill: 2  4. Vitamin B12 deficiency  - Vitamin B12  5. Vitamin D deficiency  - Vitamin D, 25-hydroxy  6. Upper respiratory tract infection, unspecified type  - amoxicillin-clavulanate (AUGMENTIN) 875-125 MG tablet; Take 1 tablet by mouth 2 (two) times daily.  Dispense: 20 tablet; Refill: 0 - guaiFENesin (MUCINEX) 600 MG 12 hr tablet; Take 1 tablet (600 mg total) by mouth 2 (two) times daily for 7 days.  Dispense: 14 tablet; Refill: 0   Follow up:  Follow up in 6 months

## 2023-04-11 NOTE — Progress Notes (Signed)
@Patient  ID: Sheri Davis, female    DOB: [birth year], 62 y.o.   MRN: [id number]  Chief Complaint  Patient presents with   Sinusitis   Prediabetes    Follow up    Referring provider: Barbette Merino, NP   HPI  Sheri Davis presents for follow-up.  She is a previous patient of Mike Gip, FNP.  She  has a past medical history of Allergy, Asthma, GERD (gastroesophageal reflux disease), HIV infection (HCC), Hyperlipidemia, and Tachycardia (y-12).    Patient presents today for follow-up of chronic conditions.  Patient complains today of sinus congestion pressure and pain as well as chest congestion with wheezing.  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  She states that he has been sick over the past couple weeks.  Patient started getting symptoms last week.  She states symptoms are worsening.  She has been using over-the-counter Alka-Seltzer and nasal spray.  Patient did recently have blood work done with infectious disease.   Denies f/c/s, n/v/d, hemoptysis, PND, leg swelling Denies chest pain or edema     No Known Allergies  Immunization History  Administered Date(s) Administered   Influenza Split 09/08/2011, 08/14/2012   Influenza Whole 08/28/2007, 09/09/2007, 09/14/2008, 09/24/2009, 09/12/2010   Influenza,inj,Quad PF,6+ Mos 08/26/2014, 08/18/2015, 09/13/2016, 07/18/2017, 08/15/2018, 08/21/2019, 09/13/2020, 08/19/2021, 08/30/2022   Influenza-Unspecified 08/25/2013   PFIZER(Purple Top)SARS-COV-2 Vaccination 12/03/2019, 12/24/2019, 09/13/2020   PPD Test 01/29/2015, 02/04/2016, 07/01/2016, 11/16/2017, 12/01/2017   Pneumococcal Conjugate-13 04/29/2015   Pneumococcal Polysaccharide-23 08/28/2007, 01/29/2012, 01/13/2021   Tdap 08/26/2014    Past Medical History:  Diagnosis Date   Allergy    Asthma    Back pain    GERD (gastroesophageal reflux disease)    HIV infection (HCC)    Hyperlipidemia    Osteoarthritis of both knees    Tachycardia y-12   Pt was  under stress at the time    Tobacco History: Social History   Tobacco Use  Smoking Status Never  Smokeless Tobacco Never   Counseling given: Not Answered   Outpatient Encounter Medications as of 04/11/2023  Medication Sig   albuterol (VENTOLIN HFA) 108 (90 Base) MCG/ACT inhaler Inhale 1 puff into the lungs every 4 (four) hours as needed for shortness of breath.   amoxicillin-clavulanate (AUGMENTIN) 875-125 MG tablet Take 1 tablet by mouth 2 (two) times daily.   cyclobenzaprine (FLEXERIL) 10 MG tablet TAKE 1 TABLET(10 MG) BY MOUTH THREE TIMES DAILY AS NEEDED FOR MUSCLE SPASMS   diphenhydramine-acetaminophen (TYLENOL PM) 25-500 MG TABS tablet Take 1 tablet by mouth at bedtime as needed.   efavirenz-emtricitab-tenofovir (ATRIPLA) 600-200-300 MG tablet Take 1 tablet by mouth at bedtime.   Flaxseed, Linseed, (FLAX SEED OIL) 1000 MG CAPS Take by mouth.   guaiFENesin (MUCINEX) 600 MG 12 hr tablet Take 1 tablet (600 mg total) by mouth 2 (two) times daily for 7 days.   hydrOXYzine (ATARAX) 25 MG tablet Take 1.5-2 tablets (37.5-50 mg total) by mouth at bedtime as needed. For sleep   LORazepam (ATIVAN) 0.5 MG tablet TAKE 1 TABLET(0.5 MG) BY MOUTH TWICE DAILY AS NEEDED FOR ANXIETY   SF 5000 PLUS 1.1 % CREA dental cream Take by mouth at bedtime.   triamcinolone cream (KENALOG) 0.1 % Apply 1 Application topically 2 (two) times daily.   [DISCONTINUED] atenolol (TENORMIN) 25 MG tablet Take 1 tablet (25 mg total) by mouth daily.   [DISCONTINUED] fluticasone (FLONASE) 50 MCG/ACT nasal spray SHAKE LIQUID AND USE 1 SPRAY IN EACH NOSTRIL EVERY DAY   [  DISCONTINUED] fluticasone (FLOVENT HFA) 110 MCG/ACT inhaler Inhale 2 puffs into the lungs daily as needed.   atenolol (TENORMIN) 25 MG tablet Take 1 tablet (25 mg total) by mouth daily.   fluticasone (FLONASE) 50 MCG/ACT nasal spray SHAKE LIQUID AND USE 1 SPRAY IN EACH NOSTRIL EVERY DAY   fluticasone (FLOVENT HFA) 110 MCG/ACT inhaler Inhale 2 puffs into the  lungs daily as needed.   Fluticasone Propionate, Inhal, (FLOVENT IN) Inhale into the lungs.   Olopatadine HCl 0.2 % SOLN Apply 1 drop to eye daily as needed. (Patient not taking: Reported on 03/21/2023)   No facility-administered encounter medications on file as of 04/11/2023.     Review of Systems  Review of Systems  Constitutional: Negative.   HENT: Negative.    Cardiovascular: Negative.   Gastrointestinal: Negative.   Allergic/Immunologic: Negative.   Neurological: Negative.   Psychiatric/Behavioral: Negative.         Physical Exam  BP 129/70   Pulse 63   Temp (!) 97.1 F (36.2 C)   Wt 213 lb 12.8 oz (97 kg)   SpO2 100%   BMI 36.70 kg/m   Wt Readings from Last 5 Encounters:  04/11/23 213 lb 12.8 oz (97 kg)  11/22/22 209 lb (94.8 kg)  10/11/22 209 lb (94.8 kg)  09/06/22 211 lb 12.8 oz (96.1 kg)  08/30/22 211 lb 12.8 oz (96.1 kg)     Physical Exam Vitals and nursing note reviewed.  Constitutional:      General: She is not in acute distress.    Appearance: She is well-developed.  Cardiovascular:     Rate and Rhythm: Normal rate and regular rhythm.  Pulmonary:     Effort: Pulmonary effort is normal.     Breath sounds: Normal breath sounds.  Neurological:     Mental Status: She is alert and oriented to person, place, and time.      Lab Results:  CBC    Component Value Date/Time   WBC 8.1 02/14/2023 0850   RBC 4.16 02/14/2023 0850   HGB 13.5 02/14/2023 0850   HGB 13.5 10/11/2022 1134   HCT 38.9 02/14/2023 0850   HCT 40.0 10/11/2022 1134   PLT 333 02/14/2023 0850   PLT 303 10/11/2022 1134   MCV 93.5 02/14/2023 0850   MCV 95 10/11/2022 1134   MCH 32.5 02/14/2023 0850   MCHC 34.7 02/14/2023 0850   RDW 11.9 02/14/2023 0850   RDW 12.5 10/11/2022 1134   LYMPHSABS 3,426 02/14/2023 0850   MONOABS 438 01/30/2017 0858   EOSABS 300 02/14/2023 0850   BASOSABS 32 02/14/2023 0850    BMET    Component Value Date/Time   NA 139 02/14/2023 0850   NA 141  10/11/2022 1134   K 4.7 02/14/2023 0850   CL 106 02/14/2023 0850   CO2 24 02/14/2023 0850   GLUCOSE 96 02/14/2023 0850   BUN 19 02/14/2023 0850   BUN 11 10/11/2022 1134   CREATININE 0.69 02/14/2023 0850   CALCIUM 9.3 02/14/2023 0850   GFRNONAA 97 01/26/2020 1104   GFRNONAA >89 07/18/2017 0902   GFRAA 112 01/26/2020 1104   GFRAA >89 07/18/2017 0902    BNP No results found for: "BNP"  ProBNP No results found for: "PROBNP"  Imaging: No results found.   Assessment & Plan:   Prediabetes - POCT glycosylated hemoglobin (Hb A1C)  2. Mild intermittent asthma without complication  - fluticasone (FLOVENT HFA) 110 MCG/ACT inhaler; Inhale 2 puffs into the lungs daily as needed.  Dispense: 12 g; Refill: 11 - fluticasone (FLONASE) 50 MCG/ACT nasal spray; SHAKE LIQUID AND USE 1 SPRAY IN EACH NOSTRIL EVERY DAY  Dispense: 16 g; Refill: 5  3. Essential hypertension  - atenolol (TENORMIN) 25 MG tablet; Take 1 tablet (25 mg total) by mouth daily.  Dispense: 30 tablet; Refill: 2  4. Vitamin B12 deficiency  - Vitamin B12  5. Vitamin D deficiency  - Vitamin D, 25-hydroxy  6. Upper respiratory tract infection, unspecified type  - amoxicillin-clavulanate (AUGMENTIN) 875-125 MG tablet; Take 1 tablet by mouth 2 (two) times daily.  Dispense: 20 tablet; Refill: 0 - guaiFENesin (MUCINEX) 600 MG 12 hr tablet; Take 1 tablet (600 mg total) by mouth 2 (two) times daily for 7 days.  Dispense: 14 tablet; Refill: 0   Follow up:  Follow up in 6 months     Ivonne Andrew, NP 04/11/2023

## 2023-04-12 ENCOUNTER — Other Ambulatory Visit: Payer: Self-pay | Admitting: Nurse Practitioner

## 2023-04-12 LAB — POCT GLYCOSYLATED HEMOGLOBIN (HGB A1C): Hemoglobin A1C: 6.1 % — AB (ref 4.0–5.6)

## 2023-04-12 MED ORDER — ARNUITY ELLIPTA 100 MCG/ACT IN AEPB: 1.0000 | INHALATION_SPRAY | Freq: Every day | RESPIRATORY_TRACT | 2 refills | Status: DC

## 2023-04-13 ENCOUNTER — Other Ambulatory Visit: Payer: Self-pay | Admitting: Nurse Practitioner

## 2023-04-13 LAB — VITAMIN B12: Vitamin B-12: 1013 pg/mL (ref 232–1245)

## 2023-04-13 LAB — VITAMIN D 25 HYDROXY (VIT D DEFICIENCY, FRACTURES): Vit D, 25-Hydroxy: 13.7 ng/mL — ABNORMAL LOW (ref 30.0–100.0)

## 2023-04-13 MED ORDER — VITAMIN D (ERGOCALCIFEROL) 1.25 MG (50000 UNIT) PO CAPS: 50000.0000 [IU] | ORAL_CAPSULE | ORAL | 2 refills | Status: DC

## 2023-04-18 ENCOUNTER — Other Ambulatory Visit: Payer: Self-pay

## 2023-04-18 ENCOUNTER — Ambulatory Visit (INDEPENDENT_AMBULATORY_CARE_PROVIDER_SITE_OTHER): Payer: Medicare HMO | Admitting: Infectious Diseases

## 2023-04-18 VITALS — BP 117/81 | HR 67 | Temp 98.0°F | Wt 213.8 lb

## 2023-04-18 DIAGNOSIS — Z0000 Encounter for general adult medical examination without abnormal findings: Secondary | ICD-10-CM

## 2023-04-18 DIAGNOSIS — Z5181 Encounter for therapeutic drug level monitoring: Secondary | ICD-10-CM

## 2023-04-18 DIAGNOSIS — B20 Human immunodeficiency virus [HIV] disease: Secondary | ICD-10-CM | POA: Diagnosis not present

## 2023-04-18 DIAGNOSIS — Z7185 Encounter for immunization safety counseling: Secondary | ICD-10-CM

## 2023-04-18 NOTE — Progress Notes (Signed)
[id number], [place], [place], [address]                                                                  Phn. [phone]; Fax: [phone]                                                                             Date: 04/18/23 Reason for Visit: HIV fu   HPI: Sheri Davis is a 62 y.o.old female with a history of HIV acquired ? 2007 via heterosexual encounter, seems to have been on Atripla since diagnosis, following my partner Dr Ninetta Lights since 2008, Allergy/Asthma, GERD, Hyperlipidemia, Anxiety/Depression who is here for regular fu.  Last seen 08/30/2022. Well controlled on atripla  Lab Results  Component Value Date   HIV1RNAQUANT Not Detected 02/14/2023   Lab Results  Component Value Date   CD4TABS 1,322 02/14/2023   CD4TABS 1,234 08/02/2022   CD4TABS 1,263 10/05/2021    Interval hx/current visit: Taking Atripla every day without missing doses, side effects or barriers to adherence of tx. 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 Symptoms are improving. Denies fevers, chills. She has been following with a new PCP Angus Seller NP). Reports she was prescribed flax seed for HLD. She is also getting gel injections for her left knee OA as she does not want knee replacement. Denies smoking, alcohol and IVDU. Denies being sexually active. Discussed with her about following up with her PCP regarding being up to date on cervical ca, breast ca and colon ca screening. She follows with dental clinic at [facility]. Denies meningococcal vaccine as she wants to think about it.   No complaints otherwise.   ROS: As stated in  above HPI; all other systems were reviewed and are otherwise negative unless noted below  No reported fever / chills, night sweats, unintentional weight loss, acute visual change, odynophagia, chest pain/pressure, new or worsened SOB or WOB, nausea, vomiting, diarrhea, dysuria, GU discharge, syncope, seizures, red/hot swollen joints, hallucinations / delusions, rashes, new allergies, unusual / excessive bleeding, swollen lymph nodes, or new hospitalizations/ED visits/Urgent Care visits since the pt was last seen.  PMH/ PSH/ FamHx / Social Hx , medications and allergies reviewed and updated as appropriate; please see corresponding tab in EHR / prior notes  Current Outpatient Medications on File Prior to Visit  Medication Sig Dispense Refill   albuterol (VENTOLIN HFA) 108 (90 Base) MCG/ACT inhaler Inhale 1 puff into the lungs every 4 (four) hours as needed for shortness of breath. 18 g 11   amoxicillin-clavulanate (AUGMENTIN) 875-125 MG tablet Take 1 tablet by mouth 2 (two) times daily. 20 tablet 0   atenolol (TENORMIN) 25 MG tablet Take 1 tablet (25 mg total) by mouth daily. 30 tablet 2   cyanocobalamin (VITAMIN B12) 1000 MCG tablet Take 1,000 mcg by mouth daily.     cyclobenzaprine (FLEXERIL) 10 MG tablet TAKE 1 TABLET(10 MG) BY MOUTH THREE TIMES DAILY AS NEEDED FOR MUSCLE SPASMS 30 tablet 0   diphenhydramine-acetaminophen (TYLENOL PM) 25-500 MG TABS tablet Take 1 tablet by mouth at bedtime as needed.     efavirenz-emtricitab-tenofovir (ATRIPLA) 600-200-300 MG tablet Take 1 tablet by mouth at bedtime. 30 tablet 11   Flaxseed, Linseed, (FLAX SEED OIL) 1000 MG CAPS Take by mouth.     fluticasone (FLONASE) 50 MCG/ACT nasal spray SHAKE LIQUID AND USE 1 SPRAY IN EACH NOSTRIL EVERY DAY 16 g 5   fluticasone (FLOVENT HFA) 110 MCG/ACT inhaler Inhale 2 puffs into the lungs daily as needed. 12 g 11   Fluticasone Furoate (ARNUITY ELLIPTA) 100 MCG/ACT AEPB Inhale 1  Inhalation into the lungs daily. 30 each 2   Fluticasone Propionate, Inhal, (FLOVENT IN) Inhale into the lungs.     guaiFENesin (MUCINEX) 600 MG 12 hr tablet Take 1 tablet (600 mg total) by mouth 2 (two) times daily for 7 days. 14 tablet 0   hydrOXYzine (ATARAX) 25 MG tablet Take 1.5-2 tablets (37.5-50 mg total) by mouth at bedtime as needed. For sleep 135 tablet 0   LORazepam (ATIVAN) 0.5 MG tablet TAKE 1 TABLET(0.5 MG) BY MOUTH TWICE DAILY AS NEEDED FOR ANXIETY 30 tablet 0   Olopatadine HCl 0.2 % SOLN Apply 1 drop to eye daily as needed. 7.5 mL 0   SF 5000 PLUS 1.1 % CREA dental cream Take by mouth at bedtime.     triamcinolone cream (KENALOG) 0.1 % Apply 1 Application topically 2 (two) times daily. 30 g 0   Vitamin D, Ergocalciferol, (DRISDOL) 1.25 MG (50000 UNIT) CAPS capsule Take 1 capsule (50,000 Units total) by mouth every 7 (seven) days. 5 capsule 2   No current facility-administered medications on file prior to visit.   No Known Allergies  Past Medical History:  Diagnosis Date   Allergy    Asthma    Back pain    GERD (gastroesophageal reflux disease)    HIV infection (HCC)    Hyperlipidemia    Osteoarthritis of both knees    Tachycardia y-12   Pt was under stress at the time   Past Surgical History:  Procedure Laterality Date   COLONOSCOPY WITH PROPOFOL N/A 04/22/2013   Procedure: COLONOSCOPY WITH PROPOFOL;  Surgeon: Charolett Bumpers, MD;  Location: [facility];  Service: Endoscopy;  Laterality: N/A;   NO PAST SURGERIES     Social History   Socioeconomic History   Marital status: Single    Spouse name: Not on file   Number of children: Not on file   Years of education: Not on file   Highest education level: Bachelor's degree (e.g., BA, AB, BS)  Occupational History   Not on file  Tobacco Use   Smoking status: Never   Smokeless tobacco: Never  Vaping Use   Vaping Use: Never used  Substance and Sexual  Activity   Alcohol use: Yes    Comment: occasional   Drug  use: Not Currently    Types: Marijuana    Comment: cannabis several years ago   Sexual activity: Never    Partners: Male    Comment: declined condoms  Other Topics Concern   Not on file  Social History Narrative   Not on file   Social Determinants of Health   Financial Resource Strain: Low Risk  (11/22/2022)   Overall Financial Resource Strain (CARDIA)    Difficulty of Paying Living Expenses: Not very hard  Food Insecurity: No Food Insecurity (11/22/2022)   Hunger Vital Sign    Worried About Running Out of Food in the Last Year: Never true    Ran Out of Food in the Last Year: Never true  Transportation Needs: No Transportation Needs (11/22/2022)   PRAPARE - Administrator, Civil Service (Medical): No    Lack of Transportation (Non-Medical): No  Physical Activity: Sufficiently Active (11/22/2022)   Exercise Vital Sign    Days of Exercise per Week: 7 days    Minutes of Exercise per Session: 60 min  Stress: Stress Concern Present (11/22/2022)   [employer] of Occupational Health - Occupational Stress Questionnaire    Feeling of Stress : To some extent  Social Connections: Moderately Integrated (11/22/2022)   Social Connection and Isolation Panel [NHANES]    Frequency of Communication with Friends and Family: More than three times a week    Frequency of Social Gatherings with Friends and Family: More than three times a week    Attends Religious Services: More than 4 times per year    Active Member of [employer] or Organizations: Yes    Attends Banker Meetings: Never    Marital Status: Never married  Intimate Partner Violence: Not At Risk (11/22/2022)   Humiliation, Afraid, Rape, and Kick questionnaire    Fear of Current or Ex-Partner: No    Emotionally Abused: No    Physically Abused: No    Sexually Abused: No   Family History  Problem Relation Age of Onset   Alcohol abuse Mother    Heart disease Mother    Anemia Mother    Hypokalemia Mother     Schizophrenia Mother    Osteoarthritis Father    Diabetes Father    Hypertension Father    Goiter Father    Mental illness Father    Diabetes Brother    Breast cancer Neg Hx     Vitals  BP 117/81   Pulse 67   Temp 98 F (36.7 C) (Oral)   Wt 213 lb 12.8 oz (97 kg)   SpO2 96%   BMI 36.70 kg/m    Examination  Gen: no acute distress, Obese  HEENT: [place]/AT, no scleral icterus, no pale conjunctivae, hearing normal, oral mucosa moist, no  Neck: Supple Cardio: Regular rate and rhythm Resp: Pulmonary effort normal in room air GI: nondistended GU: Musc: Extremities: No pedal edema Skin: No rashes Neuro: grossly non focal , awake, alert and oriented * 3, ambulatory, follows commands.   Psych: Calm, cooperative    Lab Results HIV 1 RNA Quant (Copies/mL)  Date Value  02/14/2023 Not Detected  08/02/2022 Not Detected  10/05/2021 Not Detected   CD4 T Cell Abs (/uL)  Date Value  02/14/2023 1,322  08/02/2022 1,234  10/05/2021 1,263   No results found for: "HIV1GENOSEQ" Lab Results  Component Value Date   WBC 8.1 02/14/2023   HGB  13.5 02/14/2023   HCT 38.9 02/14/2023   MCV 93.5 02/14/2023   PLT 333 02/14/2023    Lab Results  Component Value Date   CREATININE 0.69 02/14/2023   BUN 19 02/14/2023   NA 139 02/14/2023   K 4.7 02/14/2023   CL 106 02/14/2023   CO2 24 02/14/2023   Lab Results  Component Value Date   ALT 25 02/14/2023   AST 21 02/14/2023   ALKPHOS 84 10/11/2022   BILITOT 0.2 02/14/2023    Lab Results  Component Value Date   CHOL 271 (H) 02/14/2023   TRIG 168 (H) 02/14/2023   HDL 106 02/14/2023   LDLCALC 134 (H) 02/14/2023   Lab Results  Component Value Date   HAV NEG 09/08/2011   Lab Results  Component Value Date   HEPBSAG NEG 03/07/2007   HEPBSAB POS (A) 03/07/2007   Lab Results  Component Value Date   HCVAB NEG 03/07/2007   Lab Results  Component Value Date   CHLAMYDIAWP Negative 08/02/2022   N Negative 08/02/2022   No  results found for: "GCPROBEAPT" No results found for: "QUANTGOLD"    Health Maintenance: Immunization History  Administered Date(s) Administered   Influenza Split 09/08/2011, 08/14/2012   Influenza Whole 08/28/2007, 09/09/2007, 09/14/2008, 09/24/2009, 09/12/2010   Influenza,inj,Quad PF,6+ Mos 08/26/2014, 08/18/2015, 09/13/2016, 07/18/2017, 08/15/2018, 08/21/2019, 09/13/2020, 08/19/2021, 08/30/2022   Influenza-Unspecified 08/25/2013   PFIZER(Purple Top)SARS-COV-2 Vaccination 12/03/2019, 12/24/2019, 09/13/2020   PPD Test 01/29/2015, 02/04/2016, 07/01/2016, 11/16/2017, 12/01/2017   Pneumococcal Conjugate-13 04/29/2015   Pneumococcal Polysaccharide-23 08/28/2007, 01/29/2012, 01/13/2021   Tdap 08/26/2014    Assessment/Plan: # HIV, well controlled in a heterosexual female  - Continue atripla as is, has enough refills.  - Labs discussed  - Fu in 4 months, labs at that visit   # Hyperlipidemia/HTN/Asthma/Obesity - on atenolol, on flax seed oil - ASCD 2013 risk calculator from AHA/ACC 4.2% - Defer management to PCP  # Anxiety/Depression - stable  - No reported SI or HI  # Immunization  - Discussed about meningococcal vaccine, Menveo today, provided information about it - Needs Hep A vaccine   # Health maintenance - Mammogram 09/20/22 negative, screening mammogram in a year 08/2023 - Colonoscopy in 2014 ( cannot see reports) fu with PCP for colon ca screening as indicated  - She was told she no longer needs Pap smear due to her age by her PCP - Follows with dental care at [facility]  Patient's labs were reviewed as well as his previous records. Patients questions were addressed and answered. Safe sex counseling done.  I have personally spent 41 minutes involved in face-to-face and non-face-to-face activities for this patient on the day of the visit. Professional time spent includes the following activities: Preparing to see the patient (review of tests), Obtaining and/or reviewing  separately obtained history (admission/discharge record), Performing a medically appropriate examination and/or evaluation , Ordering medications/tests/procedures, referring and communicating with other health care professionals, Documenting clinical information in the EMR, Independently interpreting results (not separately reported), Communicating results to the patient/family/caregiver, Counseling and educating the patient/family/caregiver and Care coordination (not separately reported).   Electronically signed by:  Odette Fraction, MD Infectious Disease Physician [facility] [address] [place], [place] [address] Phone: [phone]  Fax: [phone]

## 2023-04-18 NOTE — Patient Instructions (Addendum)
Meningococcal disease, prevention: ACIP recommendations (Ref): Note: Use of the abbreviation, MenACWY, refers to any meningococcal quadrivalent conjugate vaccine. MenACWY-CRM refers specifically to Menveo; MenACWY-D refers specifically to Menactra; MenACWY-TT refers specifically to [facility]. Vaccines from different manufacturers are considered interchangeable; however, it is recommended (not required) that the same product be used for all doses. Consult CDC/ACIP annual immunization schedules for additional information including specific detailed recommendations for catch-up scenarios and/or care of patients with high-risk conditions. According to ACIP, doses administered ?4 days before minimum interval or age are considered valid; however, local or state mandates may supersede this timeframe (Ref). Primary vaccination (Ref): Note: Although FDA approved in <35 years of age, use in patients <11 years is only recommended in those at increased risk; routine use not recommended by ACIP. Patients NOT at increased risk for meningococcal disease: Children 11 or 12 years: MenACWY-CRM (Menveo [1- or 2-vial formulation]), MenACWY-D (Menactra), or MenACWY-TT (MenQuadfi): IM: 0.5 mL as a single dose. Children not currently at increased risk for meningococcal disease who were previously vaccinated prior to their 10th birthday should receive the routinely recommended dose of MenACWY at 11 to 12 years. Children who received MenACWY at 66 years of age do not need an additional dose at 55 to 12 years. Adolescents: MenACWY-CRM (Menveo [1- or 2-vial formulation]), MenACWY-D (Menactra), or MenACWY-TT (MenQuadfi): IM: 0.5 mL as a single dose if not previously vaccinated. Patients at increased risk for meningococcal disease: Note: MenACWY-D (Menactra) product should be avoided in patients <79 years of age due to interference with PCV-13 immunogenicity; if MenACWY-D must be used, administer it before or concomitantly with DTaP, and  administer ?4 weeks after completion of all PCV doses as age appropriate. Dosing schedules, including number of doses, intervals, and product preference vary based on patient age and reason for increased risk of meningococcal disease. Infants ?2 months and Children <2 years: Anatomic or functional asplenia (including sickle cell disease); HIV infection; persistent complement component deficiency (including complement inhibitor use [eg, eculizumab, ravulizumab]); an outbreak (due to a vaccine serogroup); or travel to or residence in countries with hyperendemic or epidemic meningococcal disease : Dosing based on age at initial dose. Infants 2 to <3 months: MenACWY-CRM (Menveo [2-vial formulation]): IM: 0.5 mL per dose for a total of 4 doses administered at 2, 4, 6, and 77 months of age. Infants 3 to 6 months: MenACWY-CRM (Menveo [2-vial formulation]): IM: 0.5 mL per dose at 8-week intervals for 1 or 2 doses until patient is ?46 months of age; once patient ?80 months of age, administer an additional dose; once patient ?36 months of age and at least 12 weeks after the last dose, administer a final dose. Infants and Children 7 to 23 months: Note: MenACWY-CRM (Menveo) preferred in patients with HIV infection or asplenia. MenACWY-CRM (Menveo [2-vial formulation]): Infants and Children 7 to 23 months: IM: 0.5 mL per dose for a total of 2 doses; the second dose should be administered at age ?12 months and at least 12 weeks after the first dose. MenACWY-D (Menactra): Infants and Children 9 to 23 months: IM: 0.5 mL per dose for a total of 2 doses; the second dose should be administered at least 12 weeks after the first dose. May be given as early as 8 weeks apart if needed prior to travel. Note: Administer before or concomitantly with DTaP and ?4 weeks after completion of all PCV doses. Children 2 to <10 years (not previously vaccinated): Note: Administer MenACWY-D (Menactra) before or concomitantly with DTaP and ?4  weeks  after completion of all PCV doses; may be given at any time in relation to Tdap or Td. Persistent complement deficiencies (including complement inhibitor use [eg, eculizumab, ravulizumab]); functional or anatomic asplenia; HIV infection: MenACWY-CRM (Menveo [2-vial formulation]), MenACWY-D (Menactra), or MenACWY-TT (MenQuadfi): IM: 0.5 mL per dose for a total of 2 doses administered at least 8 weeks apart. At risk during an outbreak (due to vaccine serogroup) or traveling to or residing in areas where meningococcal disease is endemic/hyperendemic: MenACWY-CRM (Menveo [2-vial formulation]), MenACWY-D (Menactra), or MenACWY-TT (MenQuadfi): IM: 0.5 mL as a single dose. Children ?10 years and Adolescents (not previously vaccinated): Note: Administer MenACWY-D (Menactra) before or concomitantly with DTaP and ?4 weeks after completion of all PCV doses; may be given at any time in relation to Tdap or Td. Persistent complement deficiencies (including complement inhibitor use [eg, eculizumab, ravulizumab]); functional or anatomic asplenia; HIV infection: MenACWY-CRM (Menveo [1- or 2-vial formulation]), MenACWY-D (Menactra), or MenACWY-TT (MenQuadfi): IM: 0.5 mL per dose for a total of 2 doses administered at least 8 weeks apart. At risk during an outbreak (due to vaccine serogroup); traveling to or residing in areas where meningococcal disease is endemic/hyperendemic; Hotel manager recruits; first-year college students living in residential halls; or microbiologists routinely exposed to N. meningitidis: MenACWY-CRM (Menveo [1- or 2-vial formulation]), MenACWY-D (Menactra), or MenACWY-TT (MenQuadfi): IM: 0.5 mL as a single dose. Note: College students living in residence halls should have documentation of a vaccination not more than 5 years before entry (preferably a dose on or after their 16th birthday).

## 2023-05-21 ENCOUNTER — Other Ambulatory Visit: Payer: Self-pay | Admitting: Family Medicine

## 2023-05-21 DIAGNOSIS — F411 Generalized anxiety disorder: Secondary | ICD-10-CM

## 2023-05-21 MED ORDER — LORAZEPAM 0.5 MG PO TABS
ORAL_TABLET | ORAL | 0 refills | Status: DC
Start: 2023-05-21 — End: 2023-06-15

## 2023-05-21 NOTE — Progress Notes (Signed)
Reviewed PDMP substance reporting system prior to prescribing opiate medications. No inconsistencies noted.  Meds ordered this encounter  Medications   LORazepam (ATIVAN) 0.5 MG tablet    Sig: TAKE 1 TABLET(0.5 MG) BY MOUTH TWICE DAILY AS NEEDED FOR ANXIETY    Dispense:  30 tablet    Refill:  0    Order Specific Question:   Supervising Provider    Answer:   Quentin Angst [[id number]]   Nolon Nations  APRN, MSN, FNP-C Patient Care [facility] [address] [place], [place] [address] [phone]

## 2023-05-23 ENCOUNTER — Telehealth: Payer: Medicare HMO | Admitting: Psychiatry

## 2023-05-30 ENCOUNTER — Other Ambulatory Visit: Payer: Self-pay

## 2023-05-30 DIAGNOSIS — F411 Generalized anxiety disorder: Secondary | ICD-10-CM

## 2023-05-30 NOTE — Telephone Encounter (Signed)
Message was sent to provider. Kh

## 2023-05-30 NOTE — Telephone Encounter (Signed)
Please advise KH 

## 2023-06-13 DIAGNOSIS — M170 Bilateral primary osteoarthritis of knee: Secondary | ICD-10-CM | POA: Diagnosis not present

## 2023-06-15 ENCOUNTER — Other Ambulatory Visit: Payer: Self-pay

## 2023-06-15 DIAGNOSIS — F411 Generalized anxiety disorder: Secondary | ICD-10-CM

## 2023-06-15 MED ORDER — CYCLOBENZAPRINE HCL 10 MG PO TABS: 10.0000 mg | ORAL_TABLET | Freq: Three times a day (TID) | ORAL | 0 refills | Status: DC | PRN

## 2023-06-15 MED ORDER — LORAZEPAM 0.5 MG PO TABS: ORAL_TABLET | ORAL | 0 refills | Status: DC

## 2023-06-15 NOTE — Telephone Encounter (Signed)
Please advise Kh 

## 2023-06-18 ENCOUNTER — Other Ambulatory Visit: Payer: Self-pay | Admitting: Psychiatry

## 2023-06-18 DIAGNOSIS — G4700 Insomnia, unspecified: Secondary | ICD-10-CM

## 2023-06-18 DIAGNOSIS — F3342 Major depressive disorder, recurrent, in full remission: Secondary | ICD-10-CM

## 2023-06-18 DIAGNOSIS — F4310 Post-traumatic stress disorder, unspecified: Secondary | ICD-10-CM

## 2023-06-18 MED ORDER — HYDROXYZINE HCL 25 MG PO TABS
37.5000 mg | ORAL_TABLET | Freq: Every evening | ORAL | 0 refills | Status: DC | PRN
Start: 2023-06-18 — End: 2023-08-08

## 2023-06-18 NOTE — Telephone Encounter (Signed)
I have sent hydroxyzine-30-day supply to pharmacy.  Patient however will need an appointment for future refills.

## 2023-06-20 NOTE — Telephone Encounter (Signed)
Pt left v/m to check her my chart messages about her med's to be refilled

## 2023-06-21 NOTE — Telephone Encounter (Signed)
Called pt and she advise she has her med. KH

## 2023-06-23 ENCOUNTER — Other Ambulatory Visit: Payer: Self-pay | Admitting: Nurse Practitioner

## 2023-06-23 DIAGNOSIS — J4520 Mild intermittent asthma, uncomplicated: Secondary | ICD-10-CM

## 2023-07-06 NOTE — Progress Notes (Signed)
The ASCVD Risk score (Arnett DK, et al., 2019) failed to calculate for the following reasons:   The valid HDL cholesterol range is 20 to 100 mg/dL  Sandie Ano, RN

## 2023-07-12 ENCOUNTER — Other Ambulatory Visit: Payer: Self-pay | Admitting: Nurse Practitioner

## 2023-07-12 DIAGNOSIS — Z1231 Encounter for screening mammogram for malignant neoplasm of breast: Secondary | ICD-10-CM

## 2023-07-24 ENCOUNTER — Other Ambulatory Visit: Payer: Self-pay | Admitting: Infectious Diseases

## 2023-07-24 DIAGNOSIS — B20 Human immunodeficiency virus [HIV] disease: Secondary | ICD-10-CM

## 2023-07-31 ENCOUNTER — Other Ambulatory Visit: Payer: Self-pay

## 2023-07-31 MED ORDER — VITAMIN D (ERGOCALCIFEROL) 1.25 MG (50000 UNIT) PO CAPS: 50000.0000 [IU] | ORAL_CAPSULE | ORAL | 2 refills | Status: DC

## 2023-07-31 NOTE — Telephone Encounter (Signed)
Pt wanted to know if you will fill her Vitamin d .  Pt has been on it since May and now is out Please advise [facility]

## 2023-08-06 ENCOUNTER — Other Ambulatory Visit: Payer: Self-pay | Admitting: Psychiatry

## 2023-08-06 DIAGNOSIS — F4310 Post-traumatic stress disorder, unspecified: Secondary | ICD-10-CM

## 2023-08-06 DIAGNOSIS — G4700 Insomnia, unspecified: Secondary | ICD-10-CM

## 2023-08-06 DIAGNOSIS — F3342 Major depressive disorder, recurrent, in full remission: Secondary | ICD-10-CM

## 2023-08-07 ENCOUNTER — Other Ambulatory Visit: Payer: Self-pay | Admitting: Psychiatry

## 2023-08-07 ENCOUNTER — Telehealth: Payer: Self-pay | Admitting: Psychiatry

## 2023-08-07 DIAGNOSIS — F4310 Post-traumatic stress disorder, unspecified: Secondary | ICD-10-CM

## 2023-08-07 DIAGNOSIS — F3342 Major depressive disorder, recurrent, in full remission: Secondary | ICD-10-CM

## 2023-08-07 DIAGNOSIS — G4700 Insomnia, unspecified: Secondary | ICD-10-CM

## 2023-08-07 NOTE — Telephone Encounter (Signed)
Patient states she hasn't requested any refills, I told her the pharmacy must have. Per provider she needs to schedule an appointment for further refills. She states she is at work and will call back.

## 2023-08-07 NOTE — Telephone Encounter (Signed)
Noted  

## 2023-08-08 ENCOUNTER — Other Ambulatory Visit: Payer: Self-pay

## 2023-08-08 DIAGNOSIS — G4700 Insomnia, unspecified: Secondary | ICD-10-CM

## 2023-08-08 DIAGNOSIS — F3342 Major depressive disorder, recurrent, in full remission: Secondary | ICD-10-CM

## 2023-08-08 DIAGNOSIS — F4310 Post-traumatic stress disorder, unspecified: Secondary | ICD-10-CM

## 2023-08-08 MED ORDER — HYDROXYZINE HCL 25 MG PO TABS
37.5000 mg | ORAL_TABLET | Freq: Every evening | ORAL | 0 refills | Status: DC | PRN
Start: 2023-08-08 — End: 2023-09-17

## 2023-08-08 NOTE — Telephone Encounter (Signed)
Please advise KH 

## 2023-08-22 ENCOUNTER — Other Ambulatory Visit: Payer: Medicare HMO

## 2023-08-22 ENCOUNTER — Other Ambulatory Visit (HOSPITAL_COMMUNITY)
Admission: RE | Admit: 2023-08-22 | Discharge: 2023-08-22 | Disposition: A | Discharge status: Home / Self Care | Payer: Medicare HMO | Source: Ambulatory Visit | Attending: Infectious Diseases | Admitting: Infectious Diseases

## 2023-08-22 ENCOUNTER — Ambulatory Visit (INDEPENDENT_AMBULATORY_CARE_PROVIDER_SITE_OTHER): Payer: Medicare HMO

## 2023-08-22 ENCOUNTER — Other Ambulatory Visit: Payer: Self-pay

## 2023-08-22 DIAGNOSIS — B20 Human immunodeficiency virus [HIV] disease: Secondary | ICD-10-CM

## 2023-08-22 DIAGNOSIS — Z23 Encounter for immunization: Secondary | ICD-10-CM | POA: Diagnosis not present

## 2023-08-22 DIAGNOSIS — Z113 Encounter for screening for infections with a predominantly sexual mode of transmission: Secondary | ICD-10-CM

## 2023-08-22 DIAGNOSIS — Z79899 Other long term (current) drug therapy: Secondary | ICD-10-CM

## 2023-08-22 LAB — CBC WITH DIFFERENTIAL/PLATELET
Absolute Monocytes: 370 cells/uL (ref 200–950)
Basophils Absolute: 37 cells/uL (ref 0–200)
Basophils Relative: 0.5 %
Eosinophils Absolute: 237 cells/uL (ref 15–500)
Eosinophils Relative: 3.2 %
HCT: 40.9 % (ref 35.0–45.0)
Hemoglobin: 13.6 g/dL (ref 11.7–15.5)
Lymphs Abs: 3323 cells/uL (ref 850–3900)
MCH: 31.3 pg (ref 27.0–33.0)
MCHC: 33.3 g/dL (ref 32.0–36.0)
MCV: 94.2 fL (ref 80.0–100.0)
MPV: 10.8 fL (ref 7.5–12.5)
Monocytes Relative: 5 %
Neutro Abs: 3434 cells/uL (ref 1500–7800)
Neutrophils Relative %: 46.4 %
Platelets: 312 10*3/uL (ref 140–400)
RBC: 4.34 10*6/uL (ref 3.80–5.10)
RDW: 12.3 % (ref 11.0–15.0)
Total Lymphocyte: 44.9 %
WBC: 7.4 10*3/uL (ref 3.8–10.8)

## 2023-08-23 LAB — COMPLETE METABOLIC PANEL WITH GFR
AG Ratio: 1.7 (calc) (ref 1.0–2.5)
ALT: 22 U/L (ref 6–29)
AST: 19 U/L (ref 10–35)
Albumin: 4.2 g/dL (ref 3.6–5.1)
Alkaline phosphatase (APISO): 80 U/L (ref 37–153)
BUN: 11 mg/dL (ref 7–25)
CO2: 27 mmol/L (ref 20–32)
Calcium: 9.6 mg/dL (ref 8.6–10.4)
Chloride: 105 mmol/L (ref 98–110)
Creat: 0.68 mg/dL (ref 0.50–1.05)
Globulin: 2.5 g/dL (calc) (ref 1.9–3.7)
Glucose, Bld: 97 mg/dL (ref 65–99)
Potassium: 5 mmol/L (ref 3.5–5.3)
Sodium: 140 mmol/L (ref 135–146)
Total Bilirubin: 0.2 mg/dL (ref 0.2–1.2)
Total Protein: 6.7 g/dL (ref 6.1–8.1)
eGFR: 99 mL/min/{1.73_m2} (ref >=60)

## 2023-08-23 LAB — RPR: RPR Ser Ql: NONREACTIVE

## 2023-08-23 LAB — URINE CYTOLOGY ANCILLARY ONLY
Chlamydia: NEGATIVE
Comment: NEGATIVE
Comment: NORMAL
Neisseria Gonorrhea: NEGATIVE

## 2023-08-24 LAB — HIV-1 RNA QUANT-NO REFLEX-BLD
HIV 1 RNA Quant: <20 {copies}/mL — ABNORMAL HIGH
HIV-1 RNA Quant, Log: <1.3 {Log} — ABNORMAL HIGH

## 2023-08-24 LAB — T-HELPER CELL (CD4) - (RCID CLINIC ONLY)
CD4 % Helper T Cell: 47 % (ref 33–65)
CD4 T Cell Abs: 1380 /uL (ref 400–1790)

## 2023-08-28 ENCOUNTER — Other Ambulatory Visit: Payer: Self-pay

## 2023-08-28 NOTE — Telephone Encounter (Signed)
Please advise Kh 

## 2023-08-29 DIAGNOSIS — M170 Bilateral primary osteoarthritis of knee: Secondary | ICD-10-CM | POA: Diagnosis not present

## 2023-08-29 MED ORDER — CYCLOBENZAPRINE HCL 10 MG PO TABS: 10.0000 mg | ORAL_TABLET | Freq: Three times a day (TID) | ORAL | 0 refills | Status: DC | PRN

## 2023-08-31 ENCOUNTER — Other Ambulatory Visit: Payer: Self-pay | Admitting: Nurse Practitioner

## 2023-08-31 DIAGNOSIS — Z1212 Encounter for screening for malignant neoplasm of rectum: Secondary | ICD-10-CM

## 2023-08-31 DIAGNOSIS — F411 Generalized anxiety disorder: Secondary | ICD-10-CM

## 2023-08-31 DIAGNOSIS — I10 Essential (primary) hypertension: Secondary | ICD-10-CM

## 2023-08-31 DIAGNOSIS — Z1211 Encounter for screening for malignant neoplasm of colon: Secondary | ICD-10-CM

## 2023-08-31 MED ORDER — LORAZEPAM 0.5 MG PO TABS
ORAL_TABLET | ORAL | 0 refills | Status: DC
Start: 2023-08-31 — End: 2023-12-19

## 2023-08-31 MED ORDER — ATENOLOL 25 MG PO TABS
25.0000 mg | ORAL_TABLET | Freq: Every day | ORAL | 2 refills | Status: DC
Start: 2023-08-31 — End: 2023-10-17

## 2023-09-03 ENCOUNTER — Encounter: Payer: Self-pay | Admitting: Nurse Practitioner

## 2023-09-05 ENCOUNTER — Ambulatory Visit: Payer: Medicare HMO | Admitting: Infectious Diseases

## 2023-09-05 DIAGNOSIS — M170 Bilateral primary osteoarthritis of knee: Secondary | ICD-10-CM | POA: Diagnosis not present

## 2023-09-12 DIAGNOSIS — M170 Bilateral primary osteoarthritis of knee: Secondary | ICD-10-CM | POA: Diagnosis not present

## 2023-09-17 ENCOUNTER — Other Ambulatory Visit: Payer: Self-pay

## 2023-09-17 DIAGNOSIS — F3342 Major depressive disorder, recurrent, in full remission: Secondary | ICD-10-CM

## 2023-09-17 DIAGNOSIS — F4310 Post-traumatic stress disorder, unspecified: Secondary | ICD-10-CM

## 2023-09-17 DIAGNOSIS — G4700 Insomnia, unspecified: Secondary | ICD-10-CM

## 2023-09-17 MED ORDER — HYDROXYZINE HCL 25 MG PO TABS: 37.5000 mg | ORAL_TABLET | Freq: Every evening | ORAL | 0 refills | Status: DC | PRN

## 2023-09-17 NOTE — Telephone Encounter (Signed)
Please advise Kh

## 2023-09-19 ENCOUNTER — Encounter: Payer: Self-pay | Admitting: Infectious Diseases

## 2023-09-19 ENCOUNTER — Other Ambulatory Visit: Payer: Self-pay

## 2023-09-19 ENCOUNTER — Ambulatory Visit: Payer: Medicare HMO | Admitting: Infectious Diseases

## 2023-09-19 VITALS — BP 119/83 | HR 68 | Temp 98.0°F | Ht 65.0 in | Wt 210.0 lb

## 2023-09-19 DIAGNOSIS — Z79899 Other long term (current) drug therapy: Secondary | ICD-10-CM | POA: Insufficient documentation

## 2023-09-19 DIAGNOSIS — Z23 Encounter for immunization: Secondary | ICD-10-CM

## 2023-09-19 DIAGNOSIS — B20 Human immunodeficiency virus [HIV] disease: Secondary | ICD-10-CM

## 2023-09-19 NOTE — Progress Notes (Addendum)
[address], [place], [place], [address]                                                                  Phn. [phone]; Fax: [phone]                                                                             Date: 04/18/23 Reason for Visit: HIV fu   HPI: Sheri Davis is a 62 y.o.old female with a history of HIV acquired ? 2007 via heterosexual encounter, seems to have been on Atripla since diagnosis, following my partner Dr Ninetta Lights since 2008, Allergy/Asthma, GERD, Hyperlipidemia, Anxiety/Depression who is here for regular fu.  Last seen 04/18/23. Well controlled on atripla  Lab Results  Component Value Date   HIV1RNAQUANT <20 (H) 08/22/2023   Lab Results  Component Value Date   CD4TABS 1,380 08/22/2023   CD4TABS 1,322 02/14/2023   CD4TABS 1,234 08/02/2022   Interval hx/current visit: Taking ART as instructed however she was concerned about a detectable viral load after missing two doses of ART when recently forgot to take due to staying up late watching TV. Discussed her VL < 20 is similar to being not detected.  She reports taking taking flaxseed under the guidance of their primary care doctor for HLD. She reports receiving flu vaccine and declined further COVID-19 vaccinations due to side effects from the third dose. She has not been immunized against Hepatitis A but agree to start the two-dose vaccine series. No complaints otherwise.   ROS: As stated in above HPI; all other systems were reviewed and are otherwise negative unless noted below  No reported fever / chills, night sweats, unintentional weight loss, acute visual change, odynophagia, chest pain/pressure, new or worsened SOB or WOB, nausea, vomiting, diarrhea, dysuria, GU discharge, syncope, seizures, red/hot  swollen joints, hallucinations / delusions, rashes, new allergies, unusual / excessive bleeding, swollen lymph nodes, or new hospitalizations/ED visits/Urgent Care visits since the pt was last seen.  PMH/ PSH/ FamHx / Social Hx , medications and allergies reviewed and updated as appropriate; please see corresponding tab in EHR / prior notes                                        Current Outpatient Medications on File Prior to Visit  Medication Sig Dispense Refill   albuterol (VENTOLIN HFA) 108 (90 Base) MCG/ACT inhaler Inhale 1 puff into the lungs every 4 (four) hours as needed for shortness of breath. 18 g 11   atenolol (TENORMIN)  25 MG tablet Take 1 tablet (25 mg total) by mouth daily. 30 tablet 2   cetirizine (ZYRTEC) 10 MG tablet TAKE 1 TABLET(10 MG) BY MOUTH AT BEDTIME 30 tablet 3   cyanocobalamin (VITAMIN B12) 1000 MCG tablet Take 1,000 mcg by mouth daily.     cyclobenzaprine (FLEXERIL) 10 MG tablet Take 1 tablet (10 mg total) by mouth 3 (three) times daily as needed for muscle spasms. 30 tablet 0   efavirenz-emtricitab-tenofovir (ATRIPLA) 600-200-300 MG tablet TAKE 1 TABLET BY MOUTH AT BEDTIME 30 tablet 2   Flaxseed, Linseed, (FLAX SEED OIL) 1000 MG CAPS Take by mouth.     fluticasone (FLONASE) 50 MCG/ACT nasal spray SHAKE LIQUID AND USE 1 SPRAY IN EACH NOSTRIL EVERY DAY 16 g 5   Fluticasone Furoate (ARNUITY ELLIPTA) 100 MCG/ACT AEPB Inhale 1 Inhalation into the lungs daily. 30 each 2   hydrOXYzine (ATARAX) 25 MG tablet Take 1.5-2 tablets (37.5-50 mg total) by mouth at bedtime as needed. For sleep 60 tablet 0   LORazepam (ATIVAN) 0.5 MG tablet TAKE 1 TABLET(0.5 MG) BY MOUTH TWICE DAILY AS NEEDED FOR ANXIETY 30 tablet 0   SF 5000 PLUS 1.1 % CREA dental cream Take by mouth at bedtime.     triamcinolone cream (KENALOG) 0.1 % Apply 1 Application topically 2 (two) times daily. 30 g 0   Vitamin D, Ergocalciferol, (DRISDOL) 1.25 MG (50000 UNIT) CAPS capsule Take 1 capsule (50,000 Units total) by  mouth every 7 (seven) days. 5 capsule 2   diphenhydramine-acetaminophen (TYLENOL PM) 25-500 MG TABS tablet Take 1 tablet by mouth at bedtime as needed. (Patient not taking: Reported on 09/19/2023)     Olopatadine HCl 0.2 % SOLN Apply 1 drop to eye daily as needed. (Patient not taking: Reported on 09/19/2023) 7.5 mL 0   No current facility-administered medications on file prior to visit.     No Known Allergies  Past Medical History:  Diagnosis Date   Allergy    Asthma    Back pain    GERD (gastroesophageal reflux disease)    HIV infection (HCC)    Hyperlipidemia    Osteoarthritis of both knees    Tachycardia y-12   Pt was under stress at the time   Past Surgical History:  Procedure Laterality Date   COLONOSCOPY WITH PROPOFOL N/A 04/22/2013   Procedure: COLONOSCOPY WITH PROPOFOL;  Surgeon: Charolett Bumpers, MD;  Location: [facility];  Service: Endoscopy;  Laterality: N/A;   NO PAST SURGERIES     Social History   Socioeconomic History   Marital status: Single    Spouse name: Not on file   Number of children: Not on file   Years of education: Not on file   Highest education level: Bachelor's degree (e.g., BA, AB, BS)  Occupational History   Not on file  Tobacco Use   Smoking status: Never   Smokeless tobacco: Never  Vaping Use   Vaping status: Never Used  Substance and Sexual Activity   Alcohol use: Yes    Comment: occasional   Drug use: Not Currently    Types: Marijuana    Comment: cannabis several years ago   Sexual activity: Never    Partners: Male    Comment: declined condoms  Other Topics Concern   Not on file  Social History Narrative   Not on file   Social Determinants of Health   Financial Resource Strain: Low Risk  (11/22/2022)   Overall Financial Resource Strain (CARDIA)    Difficulty  of Paying Living Expenses: Not very hard  Food Insecurity: No Food Insecurity (11/22/2022)   Hunger Vital Sign    Worried About Running Out of Food in the Last  Year: Never true    Ran Out of Food in the Last Year: Never true  Transportation Needs: No Transportation Needs (11/22/2022)   PRAPARE - Administrator, Civil Service (Medical): No    Lack of Transportation (Non-Medical): No  Physical Activity: Sufficiently Active (11/22/2022)   Exercise Vital Sign    Days of Exercise per Week: 7 days    Minutes of Exercise per Session: 60 min  Stress: Stress Concern Present (11/22/2022)   [employer] of Occupational Health - Occupational Stress Questionnaire    Feeling of Stress : To some extent  Social Connections: Moderately Integrated (11/22/2022)   Social Connection and Isolation Panel [NHANES]    Frequency of Communication with Friends and Family: More than three times a week    Frequency of Social Gatherings with Friends and Family: More than three times a week    Attends Religious Services: More than 4 times per year    Active Member of [employer] or Organizations: Yes    Attends Banker Meetings: Never    Marital Status: Never married  Intimate Partner Violence: Not At Risk (11/22/2022)   Humiliation, Afraid, Rape, and Kick questionnaire    Fear of Current or Ex-Partner: No    Emotionally Abused: No    Physically Abused: No    Sexually Abused: No   Family History  Problem Relation Age of Onset   Alcohol abuse Mother    Heart disease Mother    Anemia Mother    Hypokalemia Mother    Schizophrenia Mother    Osteoarthritis Father    Diabetes Father    Hypertension Father    Goiter Father    Mental illness Father    Diabetes Brother    Breast cancer Neg Hx     Vitals  BP 119/83   Pulse 68   Temp 98 F (36.7 C) (Temporal)   Ht 5\' 5"  (1.651 m)   Wt 210 lb (95.3 kg)   SpO2 100%   BMI 34.95 kg/m    Examination  Gen: no acute distress, Obese  HEENT: [place]/AT, no scleral icterus, no pale conjunctivae, hearing normal, oral mucosa moist, no  Neck: Supple Cardio: Regular rate and rhythm Resp: Pulmonary  effort normal in room air GI: nondistended GU: Musc: Extremities: No pedal edema Skin: No rashes Neuro: grossly non focal , awake, alert and oriented * 3, ambulatory, follows commands.   Psych: Calm, cooperative    Lab Results HIV 1 RNA Quant (Copies/mL)  Date Value  08/22/2023 <20 (H)  02/14/2023 Not Detected  08/02/2022 Not Detected   CD4 T Cell Abs (/uL)  Date Value  08/22/2023 1,380  02/14/2023 1,322  08/02/2022 1,234   No results found for: "HIV1GENOSEQ" Lab Results  Component Value Date   WBC 7.4 08/22/2023   HGB 13.6 08/22/2023   HCT 40.9 08/22/2023   MCV 94.2 08/22/2023   PLT 312 08/22/2023    Lab Results  Component Value Date   CREATININE 0.68 08/22/2023   BUN 11 08/22/2023   NA 140 08/22/2023   K 5.0 08/22/2023   CL 105 08/22/2023   CO2 27 08/22/2023   Lab Results  Component Value Date   ALT 22 08/22/2023   AST 19 08/22/2023   ALKPHOS 84 10/11/2022   BILITOT 0.2 08/22/2023  Lab Results  Component Value Date   CHOL 271 (H) 02/14/2023   TRIG 168 (H) 02/14/2023   HDL 106 02/14/2023   LDLCALC 134 (H) 02/14/2023   Lab Results  Component Value Date   HAV NEG 09/08/2011   Lab Results  Component Value Date   HEPBSAG NEG 03/07/2007   HEPBSAB POS (A) 03/07/2007   Lab Results  Component Value Date   HCVAB NEG 03/07/2007   Lab Results  Component Value Date   CHLAMYDIAWP Negative 08/22/2023   N Negative 08/22/2023   No results found for: "GCPROBEAPT" No results found for: "QUANTGOLD"    Health Maintenance: Immunization History  Administered Date(s) Administered   Influenza Split 09/08/2011, 08/14/2012   Influenza Whole 08/28/2007, 09/09/2007, 09/14/2008, 09/24/2009, 09/12/2010   Influenza, Seasonal, Injecte, Preservative Fre 08/22/2023   Influenza,inj,Quad PF,6+ Mos 08/26/2014, 08/18/2015, 09/13/2016, 07/18/2017, 08/15/2018, 08/21/2019, 09/13/2020, 08/19/2021, 08/30/2022   Influenza-Unspecified 08/25/2013   PFIZER(Purple  Top)SARS-COV-2 Vaccination 12/03/2019, 12/24/2019, 09/13/2020   PPD Test 01/29/2015, 02/04/2016, 07/01/2016, 11/16/2017, 12/01/2017   Pneumococcal Conjugate-13 04/29/2015   Pneumococcal Polysaccharide-23 08/28/2007, 01/29/2012, 01/13/2021   Tdap 08/26/2014    Assessment/Plan: # HIV, well controlled in a heterosexual female  - Continue atripla as is, discussed about switching to [place] to get rid of TDF. She wants to think about it and information to read about Biktarvy provided  - Labs from 9/25 discussed   - Fu in 5 months   # Hyperlipidemia/HTN/Asthma/Obesity - on atenolol, on flax seed oil - ASCD 2013 risk calculator from AHA/ACC 4.2% - Defer management to PCP  # Anxiety/Depression - stable   # Immunization  - Hep A # 1 today   # Health maintenance - Mammogram 09/20/22 negative, screening mammogram in a year 08/2023 - Colonoscopy in 2014 ( cannot see reports) fu with PCP for colon ca screening as indicated  - She was told she no longer needs Pap smear due to her age by her PCP - [facility]s [facility]  Patient's labs were reviewed as well as his previous records. Patients questions were addressed and answered. Safe sex counseling done.  I have personally spent 41 minutes involved in face-to-face and non-face-to-face activities for this patient on the day of the visit. Professional time spent includes the following activities: Preparing to see the patient (review of tests), Obtaining and/or reviewing separately obtained history (admission/discharge record), Performing a medically appropriate examination and/or evaluation , Ordering medications/tests/procedures, referring and communicating with other health care professionals, Documenting clinical information in the EMR, Independently interpreting results (not separately reported), Communicating results to the patient/family/caregiver, Counseling and educating the patient/family/caregiver and Care coordination (not  separately reported).   Electronically signed by:  Odette Fraction, MD Infectious Disease Physician [facility] [address] [place], [place] [address] Phone: [phone]  Fax: [phone]

## 2023-09-21 NOTE — Addendum Note (Signed)
Addended by: Odette Fraction on: 09/21/2023 06:53 PM   Modules accepted: Orders

## 2023-09-26 ENCOUNTER — Ambulatory Visit
Admission: RE | Admit: 2023-09-26 | Discharge: 2023-09-26 | Disposition: A | Discharge status: Home / Self Care | Payer: Medicare HMO | Source: Ambulatory Visit | Attending: Nurse Practitioner | Admitting: Nurse Practitioner

## 2023-09-26 DIAGNOSIS — Z1231 Encounter for screening mammogram for malignant neoplasm of breast: Secondary | ICD-10-CM | POA: Insufficient documentation

## 2023-10-03 DIAGNOSIS — M170 Bilateral primary osteoarthritis of knee: Secondary | ICD-10-CM | POA: Diagnosis not present

## 2023-10-05 ENCOUNTER — Other Ambulatory Visit: Payer: Self-pay

## 2023-10-05 MED ORDER — ARNUITY ELLIPTA 100 MCG/ACT IN AEPB: 1.0000 | INHALATION_SPRAY | Freq: Every day | RESPIRATORY_TRACT | 2 refills | Status: DC

## 2023-10-05 NOTE — Telephone Encounter (Signed)
Please advise KH 

## 2023-10-09 ENCOUNTER — Other Ambulatory Visit: Payer: Self-pay | Admitting: Infectious Diseases

## 2023-10-09 DIAGNOSIS — Z21 Asymptomatic human immunodeficiency virus [HIV] infection status: Secondary | ICD-10-CM

## 2023-10-12 ENCOUNTER — Other Ambulatory Visit: Payer: Self-pay | Admitting: Nurse Practitioner

## 2023-10-12 DIAGNOSIS — G4700 Insomnia, unspecified: Secondary | ICD-10-CM

## 2023-10-12 DIAGNOSIS — F3342 Major depressive disorder, recurrent, in full remission: Secondary | ICD-10-CM

## 2023-10-12 DIAGNOSIS — F4310 Post-traumatic stress disorder, unspecified: Secondary | ICD-10-CM

## 2023-10-16 ENCOUNTER — Other Ambulatory Visit: Payer: Self-pay

## 2023-10-16 DIAGNOSIS — J4520 Mild intermittent asthma, uncomplicated: Secondary | ICD-10-CM

## 2023-10-16 MED ORDER — CETIRIZINE HCL 10 MG PO TABS: ORAL_TABLET | ORAL | 3 refills | Status: DC

## 2023-10-17 ENCOUNTER — Other Ambulatory Visit: Payer: Medicare HMO

## 2023-10-17 ENCOUNTER — Ambulatory Visit (INDEPENDENT_AMBULATORY_CARE_PROVIDER_SITE_OTHER): Payer: Medicare HMO | Admitting: Nurse Practitioner

## 2023-10-17 ENCOUNTER — Encounter: Payer: Self-pay | Admitting: Nurse Practitioner

## 2023-10-17 VITALS — BP 110/77 | HR 88 | Temp 98.2°F | Resp 14 | Ht 65.0 in | Wt 215.0 lb

## 2023-10-17 DIAGNOSIS — Z0000 Encounter for general adult medical examination without abnormal findings: Secondary | ICD-10-CM | POA: Diagnosis not present

## 2023-10-17 DIAGNOSIS — E559 Vitamin D deficiency, unspecified: Secondary | ICD-10-CM

## 2023-10-17 DIAGNOSIS — E538 Deficiency of other specified B group vitamins: Secondary | ICD-10-CM

## 2023-10-17 DIAGNOSIS — R7303 Prediabetes: Secondary | ICD-10-CM | POA: Diagnosis not present

## 2023-10-17 DIAGNOSIS — I10 Essential (primary) hypertension: Secondary | ICD-10-CM

## 2023-10-17 DIAGNOSIS — Z13220 Encounter for screening for lipoid disorders: Secondary | ICD-10-CM | POA: Diagnosis not present

## 2023-10-17 DIAGNOSIS — Z1329 Encounter for screening for other suspected endocrine disorder: Secondary | ICD-10-CM

## 2023-10-17 DIAGNOSIS — J4520 Mild intermittent asthma, uncomplicated: Secondary | ICD-10-CM

## 2023-10-17 LAB — POCT GLYCOSYLATED HEMOGLOBIN (HGB A1C): HbA1c, POC (prediabetic range): 6.4 % (ref 5.7–6.4)

## 2023-10-17 MED ORDER — CYCLOBENZAPRINE HCL 10 MG PO TABS: 10.0000 mg | ORAL_TABLET | Freq: Three times a day (TID) | ORAL | 0 refills | Status: DC | PRN

## 2023-10-17 MED ORDER — ATENOLOL 25 MG PO TABS: 25.0000 mg | ORAL_TABLET | Freq: Every day | ORAL | 2 refills | Status: DC

## 2023-10-17 MED ORDER — FLUTICASONE PROPIONATE 50 MCG/ACT NA SUSP: NASAL | 5 refills | Status: DC

## 2023-10-17 MED ORDER — ARNUITY ELLIPTA 100 MCG/ACT IN AEPB: 1.0000 | INHALATION_SPRAY | Freq: Every day | RESPIRATORY_TRACT | 2 refills | Status: DC

## 2023-10-17 NOTE — Progress Notes (Signed)
Subjective   Patient ID: Sheri Davis, female    DOB: 1961-02-05, 62 y.o.   MRN: [id number]  Chief Complaint  Patient presents with   Annual Exam    Referring provider: Ivonne Andrew, NP  Sheri Davis is a 62 y.o. female with Past Medical History: No date: Allergy No date: Asthma No date: Back pain No date: GERD (gastroesophageal reflux disease) No date: HIV infection (HCC) No date: Hyperlipidemia No date: Osteoarthritis of both knees y-12: Tachycardia     Comment:  Pt was under stress at the time   HPI  Patient presents today for a yearly physical.  Overall she has been doing well.  We discussed today in office that her A1c is a little more elevated at 6.4.  Patient states that she has not been following diabetic diet and she will do better about this starting today.  She will increase exercise as well.  Patient does need refills on medications for chronic issues.  We will check labs today. Denies f/c/s, n/v/d, hemoptysis, PND, leg swelling Denies chest pain or edema     No Known Allergies  Immunization History  Administered Date(s) Administered   Hepatitis A, Adult 09/19/2023   Influenza Split 09/08/2011, 08/14/2012   Influenza Whole 08/28/2007, 09/09/2007, 09/14/2008, 09/24/2009, 09/12/2010   Influenza, Seasonal, Injecte, Preservative Fre 08/22/2023   Influenza,inj,Quad PF,6+ Mos 08/26/2014, 08/18/2015, 09/13/2016, 07/18/2017, 08/15/2018, 08/21/2019, 09/13/2020, 08/19/2021, 08/30/2022   Influenza-Unspecified 08/25/2013   PFIZER(Purple Top)SARS-COV-2 Vaccination 12/03/2019, 12/24/2019, 09/13/2020   PPD Test 01/29/2015, 02/04/2016, 07/01/2016, 11/16/2017, 12/01/2017   Pneumococcal Conjugate-13 04/29/2015   Pneumococcal Polysaccharide-23 08/28/2007, 01/29/2012, 01/13/2021   Tdap 08/26/2014    Tobacco History: Social History   Tobacco Use  Smoking Status Never  Smokeless Tobacco Never   Counseling given: Not Answered   Outpatient  Encounter Medications as of 10/17/2023  Medication Sig   albuterol (VENTOLIN HFA) 108 (90 Base) MCG/ACT inhaler Inhale 1 puff into the lungs every 4 (four) hours as needed for shortness of breath.   cetirizine (ZYRTEC) 10 MG tablet TAKE 1 TABLET(10 MG) BY MOUTH AT BEDTIME   cyanocobalamin (VITAMIN B12) 1000 MCG tablet Take 1,000 mcg by mouth daily.   Flaxseed, Linseed, (FLAX SEED OIL) 1000 MG CAPS Take by mouth.   hydrOXYzine (ATARAX) 25 MG tablet TAKE 1 AND 1/2 TO 2 TABLETS(37.5 TO 50 MG) BY MOUTH AT BEDTIME AS NEEDED FOR SLEEP   LORazepam (ATIVAN) 0.5 MG tablet TAKE 1 TABLET(0.5 MG) BY MOUTH TWICE DAILY AS NEEDED FOR ANXIETY   triamcinolone cream (KENALOG) 0.1 % Apply 1 Application topically 2 (two) times daily.   Vitamin D, Ergocalciferol, (DRISDOL) 1.25 MG (50000 UNIT) CAPS capsule Take 1 capsule (50,000 Units total) by mouth every 7 (seven) days.   [DISCONTINUED] atenolol (TENORMIN) 25 MG tablet Take 1 tablet (25 mg total) by mouth daily.   [DISCONTINUED] cyclobenzaprine (FLEXERIL) 10 MG tablet Take 1 tablet (10 mg total) by mouth 3 (three) times daily as needed for muscle spasms.   [DISCONTINUED] fluticasone (FLONASE) 50 MCG/ACT nasal spray SHAKE LIQUID AND USE 1 SPRAY IN EACH NOSTRIL EVERY DAY   [DISCONTINUED] Fluticasone Furoate (ARNUITY ELLIPTA) 100 MCG/ACT AEPB Inhale 1 Inhalation into the lungs daily.   atenolol (TENORMIN) 25 MG tablet Take 1 tablet (25 mg total) by mouth daily.   cyclobenzaprine (FLEXERIL) 10 MG tablet Take 1 tablet (10 mg total) by mouth 3 (three) times daily as needed for muscle spasms.   diphenhydramine-acetaminophen (TYLENOL PM) 25-500 MG TABS tablet Take 1  tablet by mouth at bedtime as needed. (Patient not taking: Reported on 09/19/2023)   efavirenz-emtricitab-tenofovir (ATRIPLA) 600-200-300 MG tablet TAKE 1 TABLET BY MOUTH AT BEDTIME   fluticasone (FLONASE) 50 MCG/ACT nasal spray SHAKE LIQUID AND USE 1 SPRAY IN EACH NOSTRIL EVERY DAY   Fluticasone Furoate  (ARNUITY ELLIPTA) 100 MCG/ACT AEPB Inhale 1 Inhalation into the lungs daily.   Olopatadine HCl 0.2 % SOLN Apply 1 drop to eye daily as needed. (Patient not taking: Reported on 09/19/2023)   SF 5000 PLUS 1.1 % CREA dental cream Take by mouth at bedtime. (Patient not taking: Reported on 10/17/2023)   No facility-administered encounter medications on file as of 10/17/2023.    Review of Systems  Review of Systems  Constitutional: Negative.   HENT: Negative.    Cardiovascular: Negative.   Gastrointestinal: Negative.   Allergic/Immunologic: Negative.   Neurological: Negative.   Psychiatric/Behavioral: Negative.       Objective:   BP 110/77 (BP Location: Right Arm, Patient Position: Sitting, Cuff Size: Large)   Pulse 88   Temp 98.2 F (36.8 C)   Resp 14   Ht 5\' 5"  (1.651 m)   Wt 215 lb (97.5 kg)   SpO2 99%   BMI 35.78 kg/m   Wt Readings from Last 5 Encounters:  10/17/23 215 lb (97.5 kg)  09/19/23 210 lb (95.3 kg)  04/18/23 213 lb 12.8 oz (97 kg)  04/11/23 213 lb 12.8 oz (97 kg)  11/22/22 209 lb (94.8 kg)     Physical Exam Vitals and nursing note reviewed.  Constitutional:      General: She is not in acute distress.    Appearance: She is well-developed.  Cardiovascular:     Rate and Rhythm: Normal rate and regular rhythm.  Pulmonary:     Effort: Pulmonary effort is normal.     Breath sounds: Normal breath sounds.  Neurological:     Mental Status: She is alert and oriented to person, place, and time.       Assessment & Plan:   Routine adult health maintenance -     POCT glycosylated hemoglobin (Hb A1C) -     CBC -     Comprehensive metabolic panel -     Lipid panel -     VITAMIN D 25 Hydroxy (Vit-D Deficiency, Fractures) -     Vitamin B12 -     TSH  Prediabetes -     POCT glycosylated hemoglobin (Hb A1C) -     CBC -     Comprehensive metabolic panel  Vitamin B12 deficiency -     Vitamin B12  Lipid screening -     Lipid panel  Vitamin D  deficiency -     VITAMIN D 25 Hydroxy (Vit-D Deficiency, Fractures)  Essential hypertension -     Atenolol; Take 1 tablet (25 mg total) by mouth daily.  Dispense: 30 tablet; Refill: 2  Mild intermittent asthma without complication -     Fluticasone Propionate; SHAKE LIQUID AND USE 1 SPRAY IN EACH NOSTRIL EVERY DAY  Dispense: 16 g; Refill: 5  Thyroid disorder screen -     TSH  Other orders -     Cyclobenzaprine HCl; Take 1 tablet (10 mg total) by mouth 3 (three) times daily as needed for muscle spasms.  Dispense: 30 tablet; Refill: 0 -     Arnuity Ellipta; Inhale 1 Inhalation into the lungs daily.  Dispense: 30 each; Refill: 2     Return in about 3 months (  around 01/17/2024).   Ivonne Andrew, NP 10/17/2023

## 2023-10-17 NOTE — Patient Instructions (Addendum)
1. Prediabetes  - POCT glycosylated hemoglobin (Hb A1C) - CBC - Comprehensive metabolic panel  2. Vitamin B12 deficiency  - Vitamin B12  3. Lipid screening  - Lipid Panel  4. Vitamin D deficiency  - Vitamin D, 25-hydroxy  5. Essential hypertension  - atenolol (TENORMIN) 25 MG tablet; Take 1 tablet (25 mg total) by mouth daily.  Dispense: 30 tablet; Refill: 2  6. Mild intermittent asthma without complication  - fluticasone (FLONASE) 50 MCG/ACT nasal spray; SHAKE LIQUID AND USE 1 SPRAY IN EACH NOSTRIL EVERY DAY  Dispense: 16 g; Refill: 5  7. Thyroid disorder screen  - TSH   Follow up:  Follow up in 3 months

## 2023-10-18 ENCOUNTER — Other Ambulatory Visit (HOSPITAL_COMMUNITY): Payer: Self-pay | Admitting: Nurse Practitioner

## 2023-10-18 LAB — CBC
Hematocrit: 38.9 % (ref 34.0–46.6)
Hemoglobin: 13 g/dL (ref 11.1–15.9)
MCH: 31.7 pg (ref 26.6–33.0)
MCHC: 33.4 g/dL (ref 31.5–35.7)
MCV: 95 fL (ref 79–97)
Platelets: 315 10*3/uL (ref 150–450)
RBC: 4.1 x10E6/uL (ref 3.77–5.28)
RDW: 12 % (ref 11.7–15.4)
WBC: 6.9 10*3/uL (ref 3.4–10.8)

## 2023-10-18 LAB — COMPREHENSIVE METABOLIC PANEL
ALT: 26 [IU]/L (ref 0–32)
AST: 22 [IU]/L (ref 0–40)
Albumin: 4.1 g/dL (ref 3.9–4.9)
Alkaline Phosphatase: 94 [IU]/L (ref 44–121)
BUN/Creatinine Ratio: 17 (ref 12–28)
BUN: 12 mg/dL (ref 8–27)
Bilirubin Total: <0.2 mg/dL (ref 0.0–1.2)
CO2: 22 mmol/L (ref 20–29)
Calcium: 9.4 mg/dL (ref 8.7–10.3)
Chloride: 105 mmol/L (ref 96–106)
Creatinine, Ser: 0.7 mg/dL (ref 0.57–1.00)
Globulin, Total: 2.4 g/dL (ref 1.5–4.5)
Glucose: 106 mg/dL — ABNORMAL HIGH (ref 70–99)
Potassium: 5 mmol/L (ref 3.5–5.2)
Sodium: 141 mmol/L (ref 134–144)
Total Protein: 6.5 g/dL (ref 6.0–8.5)
eGFR: 98 mL/min/{1.73_m2} (ref >59)

## 2023-10-18 LAB — VITAMIN B12: Vitamin B-12: 987 pg/mL (ref 232–1245)

## 2023-10-18 LAB — LIPID PANEL
Chol/HDL Ratio: 2.8 ratio (ref 0.0–4.4)
Cholesterol, Total: 264 mg/dL — ABNORMAL HIGH (ref 100–199)
HDL: 95 mg/dL (ref >39)
LDL Chol Calc (NIH): 157 mg/dL — ABNORMAL HIGH (ref 0–99)
Triglycerides: 75 mg/dL (ref 0–149)
VLDL Cholesterol Cal: 12 mg/dL (ref 5–40)

## 2023-10-18 LAB — VITAMIN D 25 HYDROXY (VIT D DEFICIENCY, FRACTURES): Vit D, 25-Hydroxy: 34.9 ng/mL (ref 30.0–100.0)

## 2023-10-18 LAB — TSH: TSH: 0.964 u[IU]/mL (ref 0.450–4.500)

## 2023-10-18 MED ORDER — ROSUVASTATIN CALCIUM 5 MG PO TABS: 5.0000 mg | ORAL_TABLET | Freq: Every day | ORAL | 11 refills | Status: DC

## 2023-10-22 ENCOUNTER — Other Ambulatory Visit: Payer: Self-pay

## 2023-10-22 DIAGNOSIS — F4310 Post-traumatic stress disorder, unspecified: Secondary | ICD-10-CM

## 2023-10-22 DIAGNOSIS — G4700 Insomnia, unspecified: Secondary | ICD-10-CM

## 2023-10-22 DIAGNOSIS — F3342 Major depressive disorder, recurrent, in full remission: Secondary | ICD-10-CM

## 2023-10-22 MED ORDER — HYDROXYZINE HCL 25 MG PO TABS: 25.0000 mg | ORAL_TABLET | Freq: Every evening | ORAL | 0 refills | Status: DC | PRN

## 2023-10-22 MED ORDER — VITAMIN B-12 1000 MCG PO TABS: 1000.0000 ug | ORAL_TABLET | Freq: Every day | ORAL | 2 refills | Status: DC

## 2023-10-22 NOTE — Telephone Encounter (Signed)
Please advise KH 

## 2023-10-24 ENCOUNTER — Ambulatory Visit
Admission: RE | Admit: 2023-10-24 | Discharge: 2023-10-24 | Disposition: A | Discharge status: Home / Self Care | Payer: Medicare HMO | Source: Ambulatory Visit | Attending: Nurse Practitioner | Admitting: Nurse Practitioner

## 2023-10-24 DIAGNOSIS — Z780 Asymptomatic menopausal state: Secondary | ICD-10-CM | POA: Diagnosis not present

## 2023-10-24 DIAGNOSIS — N959 Unspecified menopausal and perimenopausal disorder: Secondary | ICD-10-CM

## 2023-10-24 DIAGNOSIS — N951 Menopausal and female climacteric states: Secondary | ICD-10-CM

## 2023-10-31 DIAGNOSIS — Z1211 Encounter for screening for malignant neoplasm of colon: Secondary | ICD-10-CM | POA: Diagnosis not present

## 2023-10-31 DIAGNOSIS — Z1212 Encounter for screening for malignant neoplasm of rectum: Secondary | ICD-10-CM | POA: Diagnosis not present

## 2023-11-08 LAB — COLOGUARD: COLOGUARD: NEGATIVE

## 2023-11-09 ENCOUNTER — Other Ambulatory Visit: Payer: Self-pay | Admitting: Nurse Practitioner

## 2023-11-09 DIAGNOSIS — F3342 Major depressive disorder, recurrent, in full remission: Secondary | ICD-10-CM

## 2023-11-09 DIAGNOSIS — G4700 Insomnia, unspecified: Secondary | ICD-10-CM

## 2023-11-09 DIAGNOSIS — F4310 Post-traumatic stress disorder, unspecified: Secondary | ICD-10-CM

## 2023-11-09 NOTE — Telephone Encounter (Signed)
Please advise Kh

## 2023-12-05 ENCOUNTER — Other Ambulatory Visit: Payer: Self-pay

## 2023-12-05 ENCOUNTER — Telehealth: Payer: Self-pay

## 2023-12-06 ENCOUNTER — Other Ambulatory Visit: Payer: Self-pay | Admitting: Nurse Practitioner

## 2023-12-06 DIAGNOSIS — E559 Vitamin D deficiency, unspecified: Secondary | ICD-10-CM

## 2023-12-06 DIAGNOSIS — F4310 Post-traumatic stress disorder, unspecified: Secondary | ICD-10-CM

## 2023-12-06 DIAGNOSIS — F3342 Major depressive disorder, recurrent, in full remission: Secondary | ICD-10-CM

## 2023-12-06 DIAGNOSIS — G4700 Insomnia, unspecified: Secondary | ICD-10-CM

## 2023-12-06 MED ORDER — HYDROXYZINE HCL 25 MG PO TABS: 25.0000 mg | ORAL_TABLET | Freq: Every evening | ORAL | 0 refills | Status: DC | PRN

## 2023-12-06 MED ORDER — VITAMIN D (ERGOCALCIFEROL) 1.25 MG (50000 UNIT) PO CAPS: 50000.0000 [IU] | ORAL_CAPSULE | ORAL | 2 refills | Status: DC

## 2023-12-07 NOTE — Telephone Encounter (Signed)
 Sent to [facility]

## 2023-12-10 ENCOUNTER — Telehealth: Payer: Self-pay

## 2023-12-10 ENCOUNTER — Other Ambulatory Visit: Payer: Self-pay

## 2023-12-10 DIAGNOSIS — F411 Generalized anxiety disorder: Secondary | ICD-10-CM

## 2023-12-10 NOTE — Progress Notes (Unsigned)
 This encounter was created in error - please disregard.

## 2023-12-10 NOTE — Progress Notes (Deleted)
Patient requested 

## 2023-12-11 NOTE — Telephone Encounter (Signed)
Routed to [place]

## 2023-12-18 ENCOUNTER — Telehealth: Payer: Self-pay

## 2023-12-18 ENCOUNTER — Other Ambulatory Visit: Payer: Self-pay

## 2023-12-18 NOTE — Telephone Encounter (Signed)
Sent to  [facility] .

## 2023-12-19 ENCOUNTER — Other Ambulatory Visit: Payer: Self-pay | Admitting: Nurse Practitioner

## 2023-12-19 DIAGNOSIS — F411 Generalized anxiety disorder: Secondary | ICD-10-CM

## 2023-12-19 MED ORDER — LORAZEPAM 0.5 MG PO TABS: ORAL_TABLET | ORAL | 0 refills | Status: DC

## 2024-01-02 ENCOUNTER — Other Ambulatory Visit: Payer: Self-pay | Admitting: Infectious Diseases

## 2024-01-02 DIAGNOSIS — Z21 Asymptomatic human immunodeficiency virus [HIV] infection status: Secondary | ICD-10-CM

## 2024-01-03 ENCOUNTER — Other Ambulatory Visit: Payer: Self-pay | Admitting: Nurse Practitioner

## 2024-01-03 ENCOUNTER — Other Ambulatory Visit: Payer: Self-pay | Admitting: Infectious Diseases

## 2024-01-03 DIAGNOSIS — F4310 Post-traumatic stress disorder, unspecified: Secondary | ICD-10-CM

## 2024-01-03 DIAGNOSIS — Z21 Asymptomatic human immunodeficiency virus [HIV] infection status: Secondary | ICD-10-CM

## 2024-01-03 DIAGNOSIS — I10 Essential (primary) hypertension: Secondary | ICD-10-CM

## 2024-01-03 DIAGNOSIS — J4520 Mild intermittent asthma, uncomplicated: Secondary | ICD-10-CM

## 2024-01-03 DIAGNOSIS — G4700 Insomnia, unspecified: Secondary | ICD-10-CM

## 2024-01-03 DIAGNOSIS — F3342 Major depressive disorder, recurrent, in full remission: Secondary | ICD-10-CM

## 2024-01-04 ENCOUNTER — Other Ambulatory Visit: Payer: Self-pay

## 2024-01-04 DIAGNOSIS — J4520 Mild intermittent asthma, uncomplicated: Secondary | ICD-10-CM

## 2024-01-04 DIAGNOSIS — I10 Essential (primary) hypertension: Secondary | ICD-10-CM

## 2024-01-04 MED ORDER — ARNUITY ELLIPTA 100 MCG/ACT IN AEPB: 1.0000 | INHALATION_SPRAY | Freq: Every day | RESPIRATORY_TRACT | 2 refills | Status: DC

## 2024-01-04 MED ORDER — CETIRIZINE HCL 10 MG PO TABS: ORAL_TABLET | ORAL | 3 refills | Status: DC

## 2024-01-04 MED ORDER — ROSUVASTATIN CALCIUM 5 MG PO TABS: 5.0000 mg | ORAL_TABLET | Freq: Every day | ORAL | 11 refills | Status: DC

## 2024-01-04 MED ORDER — ATENOLOL 25 MG PO TABS: 25.0000 mg | ORAL_TABLET | Freq: Every day | ORAL | 2 refills | Status: DC

## 2024-01-07 ENCOUNTER — Other Ambulatory Visit: Payer: Self-pay | Admitting: Nurse Practitioner

## 2024-01-23 ENCOUNTER — Ambulatory Visit: Payer: Self-pay | Admitting: Nurse Practitioner

## 2024-01-25 ENCOUNTER — Other Ambulatory Visit: Payer: Self-pay

## 2024-01-25 DIAGNOSIS — Z113 Encounter for screening for infections with a predominantly sexual mode of transmission: Secondary | ICD-10-CM

## 2024-01-25 DIAGNOSIS — B20 Human immunodeficiency virus [HIV] disease: Secondary | ICD-10-CM

## 2024-01-30 ENCOUNTER — Encounter: Payer: Self-pay | Admitting: Nurse Practitioner

## 2024-01-30 ENCOUNTER — Ambulatory Visit (INDEPENDENT_AMBULATORY_CARE_PROVIDER_SITE_OTHER): Payer: Self-pay | Admitting: Nurse Practitioner

## 2024-01-30 ENCOUNTER — Other Ambulatory Visit: Payer: No Typology Code available for payment source

## 2024-01-30 ENCOUNTER — Other Ambulatory Visit: Payer: Self-pay

## 2024-01-30 VITALS — BP 135/85 | HR 74 | Temp 98.2°F | Wt 213.0 lb

## 2024-01-30 DIAGNOSIS — B20 Human immunodeficiency virus [HIV] disease: Secondary | ICD-10-CM | POA: Diagnosis not present

## 2024-01-30 DIAGNOSIS — R7303 Prediabetes: Secondary | ICD-10-CM | POA: Diagnosis not present

## 2024-01-30 DIAGNOSIS — E559 Vitamin D deficiency, unspecified: Secondary | ICD-10-CM

## 2024-01-30 DIAGNOSIS — Z13220 Encounter for screening for lipoid disorders: Secondary | ICD-10-CM | POA: Diagnosis not present

## 2024-01-30 DIAGNOSIS — Z113 Encounter for screening for infections with a predominantly sexual mode of transmission: Secondary | ICD-10-CM | POA: Diagnosis not present

## 2024-01-30 DIAGNOSIS — Z1329 Encounter for screening for other suspected endocrine disorder: Secondary | ICD-10-CM | POA: Diagnosis not present

## 2024-01-30 LAB — POCT GLYCOSYLATED HEMOGLOBIN (HGB A1C): Hemoglobin A1C: 6.5 % — AB (ref 4.0–5.6)

## 2024-01-30 NOTE — Progress Notes (Signed)
 Subjective   Patient ID: Sheri Davis, female    DOB: [birth year], 62 y.o.   MRN: [id number]  Chief Complaint  Patient presents with   Follow-up    Referring provider: Ivonne Andrew, NP  Sheri Davis is a 63 y.o. female with Past Medical History: No date: Allergy No date: Asthma No date: Back pain No date: GERD (gastroesophageal reflux disease) No date: HIV infection (HCC) No date: Hyperlipidemia No date: Osteoarthritis of both knees y-12: Tachycardia     Comment:  Pt was under stress at the time  HPI  Patient presents today for a yearly physical.  Overall she has been doing well.  We discussed today in office that her A1c is a little more elevated at 6.5.  Patient states that she has not been following diabetic diet and she will do better about this starting today.  She will increase exercise as well. We will check labs today. Denies f/c/s, n/v/d, hemoptysis, PND, leg swelling Denies chest pain or edema   No Known Allergies  Immunization History  Administered Date(s) Administered   Hepatitis A, Adult 09/19/2023   Influenza Split 09/08/2011, 08/14/2012   Influenza Whole 08/28/2007, 09/09/2007, 09/14/2008, 09/24/2009, 09/12/2010   Influenza, Seasonal, Injecte, Preservative Fre 08/22/2023   Influenza,inj,Quad PF,6+ Mos 08/26/2014, 08/18/2015, 09/13/2016, 07/18/2017, 08/15/2018, 08/21/2019, 09/13/2020, 08/19/2021, 08/30/2022   Influenza-Unspecified 08/25/2013   PFIZER(Purple Top)SARS-COV-2 Vaccination 12/03/2019, 12/24/2019, 09/13/2020   PPD Test 01/29/2015, 02/04/2016, 07/01/2016, 11/16/2017, 12/01/2017   Pneumococcal Conjugate-13 04/29/2015   Pneumococcal Polysaccharide-23 08/28/2007, 01/29/2012, 01/13/2021   Tdap 08/26/2014    Tobacco History: Social History   Tobacco Use  Smoking Status Never  Smokeless Tobacco Never   Counseling given: Not Answered   Outpatient Encounter Medications as of 01/30/2024  Medication Sig   albuterol (VENTOLIN  HFA) 108 (90 Base) MCG/ACT inhaler Inhale 1 puff into the lungs every 4 (four) hours as needed for shortness of breath.   ARNUITY ELLIPTA 100 MCG/ACT AEPB INHALE 1 PUFF INTO THE LUNGS DAILY   atenolol (TENORMIN) 25 MG tablet TAKE 1 TABLET(25 MG) BY MOUTH DAILY   cetirizine (ZYRTEC) 10 MG tablet TAKE 1 TABLET(10 MG) BY MOUTH AT BEDTIME   cyanocobalamin (VITAMIN B12) 1000 MCG tablet Take 1 tablet (1,000 mcg total) by mouth daily.   cyclobenzaprine (FLEXERIL) 10 MG tablet TAKE 1 TABLET(10 MG) BY MOUTH THREE TIMES DAILY AS NEEDED FOR MUSCLE SPASMS   efavirenz-emtricitab-tenofovir (ATRIPLA) 600-200-300 MG tablet TAKE 1 TABLET BY MOUTH AT BEDTIME   Flaxseed, Linseed, (FLAX SEED OIL) 1000 MG CAPS Take by mouth.   fluticasone (FLONASE) 50 MCG/ACT nasal spray SHAKE LIQUID AND USE 1 SPRAY IN EACH NOSTRIL EVERY DAY   Fluticasone Furoate (ARNUITY ELLIPTA) 100 MCG/ACT AEPB Inhale 1 Inhalation into the lungs daily.   hydrOXYzine (ATARAX) 25 MG tablet TAKE 1 TABLET(25 MG) BY MOUTH AT BEDTIME AS NEEDED   LORazepam (ATIVAN) 0.5 MG tablet TAKE 1 TABLET(0.5 MG) BY MOUTH TWICE DAILY AS NEEDED FOR ANXIETY   rosuvastatin (CRESTOR) 5 MG tablet TAKE 1 TABLET(5 MG) BY MOUTH DAILY   SF 5000 PLUS 1.1 % CREA dental cream Take by mouth at bedtime.   triamcinolone cream (KENALOG) 0.1 % Apply 1 Application topically 2 (two) times daily.   Vitamin D, Ergocalciferol, (DRISDOL) 1.25 MG (50000 UNIT) CAPS capsule Take 1 capsule (50,000 Units total) by mouth every 7 (seven) days.   [DISCONTINUED] atenolol (TENORMIN) 25 MG tablet Take 1 tablet (25 mg total) by mouth daily.   diphenhydramine-acetaminophen (TYLENOL PM) 25-500  MG TABS tablet Take 1 tablet by mouth at bedtime as needed. (Patient not taking: Reported on 01/30/2024)   Olopatadine HCl 0.2 % SOLN Apply 1 drop to eye daily as needed. (Patient not taking: Reported on 01/30/2024)   [DISCONTINUED] cetirizine (ZYRTEC) 10 MG tablet TAKE 1 TABLET(10 MG) BY MOUTH AT BEDTIME    [DISCONTINUED] rosuvastatin (CRESTOR) 5 MG tablet Take 1 tablet (5 mg total) by mouth daily.   No facility-administered encounter medications on file as of 01/30/2024.    Review of Systems  Review of Systems  Constitutional: Negative.   HENT: Negative.    Cardiovascular: Negative.   Gastrointestinal: Negative.   Allergic/Immunologic: Negative.   Neurological: Negative.   Psychiatric/Behavioral: Negative.       Objective:   BP 135/85   Pulse 74   Temp 98.2 F (36.8 C) (Oral)   Wt 213 lb (96.6 kg)   SpO2 100%   BMI 35.45 kg/m   Wt Readings from Last 5 Encounters:  01/30/24 213 lb (96.6 kg)  10/17/23 215 lb (97.5 kg)  09/19/23 210 lb (95.3 kg)  04/18/23 213 lb 12.8 oz (97 kg)  04/11/23 213 lb 12.8 oz (97 kg)     Physical Exam Vitals and nursing note reviewed.  Constitutional:      General: She is not in acute distress.    Appearance: She is well-developed.  Cardiovascular:     Rate and Rhythm: Normal rate and regular rhythm.  Pulmonary:     Effort: Pulmonary effort is normal.     Breath sounds: Normal breath sounds.  Neurological:     Mental Status: She is alert and oriented to person, place, and time.       Assessment & Plan:   Prediabetes -     POCT glycosylated hemoglobin (Hb A1C) -     CBC -     Comprehensive metabolic panel -     Lipid panel  Vitamin D deficiency -     VITAMIN D 25 Hydroxy (Vit-D Deficiency, Fractures)  Lipid screening -     Lipid panel  Thyroid disorder screen -     TSH     Return in about 3 months (around 05/01/2024).   Ivonne Andrew, NP 01/30/2024

## 2024-01-30 NOTE — Patient Instructions (Signed)
 1. Prediabetes (Primary)  - POCT glycosylated hemoglobin (Hb A1C) - CBC - Comprehensive metabolic panel - Lipid Panel  2. Vitamin D deficiency  - Vitamin D, 25-hydroxy  3. Lipid screening  - Lipid Panel  4. Thyroid disorder screen  - TSH

## 2024-01-31 LAB — COMPREHENSIVE METABOLIC PANEL
ALT: 21 IU/L (ref 0–32)
AST: 26 IU/L (ref 0–40)
Albumin: 4.3 g/dL (ref 3.9–4.9)
Alkaline Phosphatase: 95 IU/L (ref 44–121)
BUN/Creatinine Ratio: 18 (ref 12–28)
BUN: 14 mg/dL (ref 8–27)
Bilirubin Total: <0.2 mg/dL (ref 0.0–1.2)
CO2: 22 mmol/L (ref 20–29)
Calcium: 9.6 mg/dL (ref 8.7–10.3)
Chloride: 101 mmol/L (ref 96–106)
Creatinine, Ser: 0.76 mg/dL (ref 0.57–1.00)
Globulin, Total: 2.2 g/dL (ref 1.5–4.5)
Glucose: 102 mg/dL — ABNORMAL HIGH (ref 70–99)
Potassium: 5.4 mmol/L — ABNORMAL HIGH (ref 3.5–5.2)
Sodium: 140 mmol/L (ref 134–144)
Total Protein: 6.5 g/dL (ref 6.0–8.5)
eGFR: 89 mL/min/{1.73_m2} (ref >59)

## 2024-01-31 LAB — LIPID PANEL
Chol/HDL Ratio: 2.6 ratio (ref 0.0–4.4)
Cholesterol, Total: 235 mg/dL — ABNORMAL HIGH (ref 100–199)
HDL: 92 mg/dL (ref >39)
LDL Chol Calc (NIH): 125 mg/dL — ABNORMAL HIGH (ref 0–99)
Triglycerides: 108 mg/dL (ref 0–149)
VLDL Cholesterol Cal: 18 mg/dL (ref 5–40)

## 2024-01-31 LAB — CBC
Hematocrit: 41.4 % (ref 34.0–46.6)
Hemoglobin: 13.9 g/dL (ref 11.1–15.9)
MCH: 31.5 pg (ref 26.6–33.0)
MCHC: 33.6 g/dL (ref 31.5–35.7)
MCV: 94 fL (ref 79–97)
Platelets: 309 10*3/uL (ref 150–450)
RBC: 4.41 x10E6/uL (ref 3.77–5.28)
RDW: 12 % (ref 11.7–15.4)
WBC: 7 10*3/uL (ref 3.4–10.8)

## 2024-01-31 LAB — TSH: TSH: 0.751 u[IU]/mL (ref 0.450–4.500)

## 2024-01-31 LAB — VITAMIN D 25 HYDROXY (VIT D DEFICIENCY, FRACTURES): Vit D, 25-Hydroxy: 35 ng/mL (ref 30.0–100.0)

## 2024-01-31 LAB — T-HELPER CELL (CD4) - (RCID CLINIC ONLY)
CD4 % Helper T Cell: 49 % (ref 33–65)
CD4 T Cell Abs: 1499 /uL (ref 400–1790)

## 2024-02-02 LAB — RPR: RPR Ser Ql: NONREACTIVE

## 2024-02-02 LAB — CBC WITH DIFFERENTIAL/PLATELET
Absolute Lymphocytes: 3089 {cells}/uL (ref 850–3900)
Absolute Monocytes: 362 {cells}/uL (ref 200–950)
Basophils Absolute: 27 {cells}/uL (ref 0–200)
Basophils Relative: 0.4 %
Eosinophils Absolute: 328 {cells}/uL (ref 15–500)
Eosinophils Relative: 4.9 %
HCT: 40.8 % (ref 35.0–45.0)
Hemoglobin: 13.8 g/dL (ref 11.7–15.5)
MCH: 31.8 pg (ref 27.0–33.0)
MCHC: 33.8 g/dL (ref 32.0–36.0)
MCV: 94 fL (ref 80.0–100.0)
MPV: 10.4 fL (ref 7.5–12.5)
Monocytes Relative: 5.4 %
Neutro Abs: 2894 {cells}/uL (ref 1500–7800)
Neutrophils Relative %: 43.2 %
Platelets: 321 10*3/uL (ref 140–400)
RBC: 4.34 10*6/uL (ref 3.80–5.10)
RDW: 12.1 % (ref 11.0–15.0)
Total Lymphocyte: 46.1 %
WBC: 6.7 10*3/uL (ref 3.8–10.8)

## 2024-02-02 LAB — COMPLETE METABOLIC PANEL WITH GFR
AG Ratio: 1.7 (calc) (ref 1.0–2.5)
ALT: 22 U/L (ref 6–29)
AST: 22 U/L (ref 10–35)
Albumin: 4.3 g/dL (ref 3.6–5.1)
Alkaline phosphatase (APISO): 82 U/L (ref 37–153)
BUN: 15 mg/dL (ref 7–25)
CO2: 25 mmol/L (ref 20–32)
Calcium: 9.4 mg/dL (ref 8.6–10.4)
Chloride: 105 mmol/L (ref 98–110)
Creat: 0.63 mg/dL (ref 0.50–1.05)
Globulin: 2.6 g/dL (ref 1.9–3.7)
Glucose, Bld: 97 mg/dL (ref 65–99)
Potassium: 4.7 mmol/L (ref 3.5–5.3)
Sodium: 138 mmol/L (ref 135–146)
Total Bilirubin: 0.3 mg/dL (ref 0.2–1.2)
Total Protein: 6.9 g/dL (ref 6.1–8.1)
eGFR: 101 mL/min/{1.73_m2} (ref >=60)

## 2024-02-02 LAB — HIV-1 RNA QUANT-NO REFLEX-BLD
HIV 1 RNA Quant: <20 {copies}/mL — ABNORMAL HIGH
HIV-1 RNA Quant, Log: <1.3 {Log_copies}/mL — ABNORMAL HIGH

## 2024-02-04 ENCOUNTER — Other Ambulatory Visit: Payer: Self-pay | Admitting: Nurse Practitioner

## 2024-02-05 ENCOUNTER — Other Ambulatory Visit: Payer: Self-pay | Admitting: Nurse Practitioner

## 2024-02-05 NOTE — Telephone Encounter (Signed)
Please advise if you want pt to continue. Kh

## 2024-02-06 ENCOUNTER — Other Ambulatory Visit: Payer: Self-pay

## 2024-02-06 ENCOUNTER — Encounter: Payer: Self-pay | Admitting: Infectious Diseases

## 2024-02-06 ENCOUNTER — Ambulatory Visit: Payer: No Typology Code available for payment source | Admitting: Infectious Diseases

## 2024-02-06 VITALS — BP 124/85 | HR 74 | Temp 98.0°F | Ht 65.0 in | Wt 213.0 lb

## 2024-02-06 DIAGNOSIS — Z0000 Encounter for general adult medical examination without abnormal findings: Secondary | ICD-10-CM

## 2024-02-06 DIAGNOSIS — Z5181 Encounter for therapeutic drug level monitoring: Secondary | ICD-10-CM

## 2024-02-06 DIAGNOSIS — B20 Human immunodeficiency virus [HIV] disease: Secondary | ICD-10-CM | POA: Diagnosis not present

## 2024-02-06 DIAGNOSIS — Z7185 Encounter for immunization safety counseling: Secondary | ICD-10-CM | POA: Insufficient documentation

## 2024-02-06 DIAGNOSIS — Z113 Encounter for screening for infections with a predominantly sexual mode of transmission: Secondary | ICD-10-CM

## 2024-02-06 MED ORDER — BICTEGRAVIR-EMTRICITAB-TENOFOV 50-200-25 MG PO TABS: 1.0000 | ORAL_TABLET | Freq: Every day | ORAL | 5 refills | Status: DC

## 2024-02-06 NOTE — Progress Notes (Addendum)
 [address], [place], [place], [address]                                                                  Phn. [phone]; Fax: [phone]                                                                             Date: 02/06/24 Reason for Visit: HIV fu   HPI: Sheri Davis is a 63 y.o.old female with a history of HIV acquired ? 2007 via heterosexual encounter, seems to have been on Atripla since diagnosis, following my partner Dr Ninetta Lights since 2008, Allergy/Asthma, GERD, Hyperlipidemia, Anxiety/Depression who is here for regular fu.  Last seen 09/19/23. Lab Results  Component Value Date   HIV1RNAQUANT <20 (H) 01/30/2024   Lab Results  Component Value Date   CD4TABS 1,499 01/30/2024   CD4TABS 1,380 08/22/2023   CD4TABS 1,322 02/14/2023   Interval hx/current visit: Taking atripla without missed doses or any barriers to adherence of treatment. She had bone density scan and mammogram in Nov 2024. She is closely followed by PCP and was last seen 3/5. She is willing to switch atripla ot [employer]. 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 She is willing to get shingles vaccine. No complaints.   ROS: As stated in above HPI; all other systems were reviewed and are otherwise negative unless noted below  No reported fever / chills, night sweats, unintentional weight loss, acute visual change, odynophagia, chest pain/pressure, new or worsened SOB or WOB, nausea, vomiting, diarrhea, dysuria, GU discharge, syncope, seizures, red/hot swollen joints, hallucinations / delusions, rashes, new allergies, unusual / excessive bleeding, swollen lymph nodes, or new hospitalizations/ED visits/Urgent Care visits since the pt was  last seen.  PMH/ PSH/ FamHx / Social Hx , medications and allergies reviewed and updated as appropriate; please see corresponding tab in EHR / prior notes                                        Current Outpatient Medications on File Prior to Visit  Medication Sig Dispense Refill   albuterol (VENTOLIN HFA) 108 (90 Base) MCG/ACT inhaler Inhale 1 puff into the lungs every 4 (four) hours as needed for shortness of breath. 18 g 11   ARNUITY ELLIPTA 100 MCG/ACT AEPB INHALE 1 PUFF INTO THE LUNGS DAILY 30 each 2   atenolol (TENORMIN) 25 MG tablet TAKE 1 TABLET(25 MG) BY MOUTH DAILY  30 tablet 2   cetirizine (ZYRTEC) 10 MG tablet TAKE 1 TABLET(10 MG) BY MOUTH AT BEDTIME 30 tablet 3   cyanocobalamin (VITAMIN B12) 1000 MCG tablet TAKE 1 TABLET(1,000 MCG) BY MOUTH DAILY 30 tablet 2   cyclobenzaprine (FLEXERIL) 10 MG tablet TAKE 1 TABLET(10 MG) BY MOUTH THREE TIMES DAILY AS NEEDED FOR MUSCLE SPASMS 30 tablet 0   efavirenz-emtricitab-tenofovir (ATRIPLA) 600-200-300 MG tablet TAKE 1 TABLET BY MOUTH AT BEDTIME 30 tablet 1   Flaxseed, Linseed, (FLAX SEED OIL) 1000 MG CAPS Take by mouth.     fluticasone (FLONASE) 50 MCG/ACT nasal spray SHAKE LIQUID AND USE 1 SPRAY IN EACH NOSTRIL EVERY DAY 16 g 5   Fluticasone Furoate (ARNUITY ELLIPTA) 100 MCG/ACT AEPB Inhale 1 Inhalation into the lungs daily. 30 each 2   hydrOXYzine (ATARAX) 25 MG tablet TAKE 1 TABLET(25 MG) BY MOUTH AT BEDTIME AS NEEDED 60 tablet 0   LORazepam (ATIVAN) 0.5 MG tablet TAKE 1 TABLET(0.5 MG) BY MOUTH TWICE DAILY AS NEEDED FOR ANXIETY 30 tablet 0   rosuvastatin (CRESTOR) 5 MG tablet TAKE 1 TABLET(5 MG) BY MOUTH DAILY 30 tablet 11   SF 5000 PLUS 1.1 % CREA dental cream Take by mouth at bedtime.     triamcinolone cream (KENALOG) 0.1 % Apply 1 Application topically 2 (two) times daily. 30 g 0   Vitamin D, Ergocalciferol, (DRISDOL) 1.25 MG (50000 UNIT) CAPS capsule Take 1 capsule (50,000 Units total) by mouth every 7 (seven) days. 5 capsule 2    diphenhydramine-acetaminophen (TYLENOL PM) 25-500 MG TABS tablet Take 1 tablet by mouth at bedtime as needed. (Patient not taking: Reported on 09/19/2023)     Olopatadine HCl 0.2 % SOLN Apply 1 drop to eye daily as needed. (Patient not taking: Reported on 09/19/2023) 7.5 mL 0   No current facility-administered medications on file prior to visit.   No Known Allergies  Past Medical History:  Diagnosis Date   Allergy    Asthma    Back pain    GERD (gastroesophageal reflux disease)    HIV infection (HCC)    Hyperlipidemia    Osteoarthritis of both knees    Tachycardia y-12   Pt was under stress at the time   Past Surgical History:  Procedure Laterality Date   COLONOSCOPY WITH PROPOFOL N/A 04/22/2013   Procedure: COLONOSCOPY WITH PROPOFOL;  Surgeon: Charolett Bumpers, MD;  Location: [facility];  Service: Endoscopy;  Laterality: N/A;   NO PAST SURGERIES     Social History   Socioeconomic History   Marital status: Single    Spouse name: Not on file   Number of children: Not on file   Years of education: Not on file   Highest education level: Bachelor's degree (e.g., BA, AB, BS)  Occupational History   Not on file  Tobacco Use   Smoking status: Never   Smokeless tobacco: Never  Vaping Use   Vaping status: Never Used  Substance and Sexual Activity   Alcohol use: Yes    Comment: occasional   Drug use: Not Currently    Types: Marijuana    Comment: cannabis several years ago   Sexual activity: Never    Partners: Male    Comment: declined condoms  Other Topics Concern   Not on file  Social History Narrative   Not on file   Social Drivers of Health   Financial Resource Strain: Low Risk  (11/22/2022)   Overall Financial Resource Strain (CARDIA)    Difficulty of Paying  Living Expenses: Not very hard  Food Insecurity: No Food Insecurity (11/22/2022)   Hunger Vital Sign    Worried About Running Out of Food in the Last Year: Never true    Ran Out of Food in the Last Year:  Never true  Transportation Needs: No Transportation Needs (11/22/2022)   PRAPARE - Administrator, Civil Service (Medical): No    Lack of Transportation (Non-Medical): No  Physical Activity: Sufficiently Active (11/22/2022)   Exercise Vital Sign    Days of Exercise per Week: 7 days    Minutes of Exercise per Session: 60 min  Stress: Stress Concern Present (11/22/2022)   [employer] of Occupational Health - Occupational Stress Questionnaire    Feeling of Stress : To some extent  Social Connections: Moderately Integrated (11/22/2022)   Social Connection and Isolation Panel [NHANES]    Frequency of Communication with Friends and Family: More than three times a week    Frequency of Social Gatherings with Friends and Family: More than three times a week    Attends Religious Services: More than 4 times per year    Active Member of [employer] or Organizations: Yes    Attends Banker Meetings: Never    Marital Status: Never married  Intimate Partner Violence: Not At Risk (11/22/2022)   Humiliation, Afraid, Rape, and Kick questionnaire    Fear of Current or Ex-Partner: No    Emotionally Abused: No    Physically Abused: No    Sexually Abused: No   Family History  Problem Relation Age of Onset   Alcohol abuse Mother    Heart disease Mother    Anemia Mother    Hypokalemia Mother    Schizophrenia Mother    Osteoarthritis Father    Diabetes Father    Hypertension Father    Goiter Father    Mental illness Father    Diabetes Brother    Breast cancer Neg Hx     Vitals  BP 124/85   Pulse 74   Temp 98 F (36.7 C) (Oral)   Ht 5\' 5"  (1.651 m)   Wt 213 lb (96.6 kg)   SpO2 98%   BMI 35.45 kg/m   Examination  Gen: no acute distress, Obese  HEENT: [place]/AT, no scleral icterus, no pale conjunctivae, hearing normal, oral mucosa moist  Neck: Supple Cardio: Regular rate and rhythm Resp: Pulmonary effort normal in room air GI: nondistended GU: Musc: Extremities:  No pedal edema Skin: No rashes Neuro: grossly non focal , awake, alert and oriented * 3, ambulatory, follows commands.   Psych: Calm, cooperative  Lab Results HIV 1 RNA Quant (Copies/mL)  Date Value  01/30/2024 <20 (H)  08/22/2023 <20 (H)  02/14/2023 Not Detected   CD4 T Cell Abs (/uL)  Date Value  01/30/2024 1,499  08/22/2023 1,380  02/14/2023 1,322   No results found for: "HIV1GENOSEQ" Lab Results  Component Value Date   WBC 6.7 01/30/2024   HGB 13.8 01/30/2024   HCT 40.8 01/30/2024   MCV 94.0 01/30/2024   PLT 321 01/30/2024    Lab Results  Component Value Date   CREATININE 0.63 01/30/2024   BUN 15 01/30/2024   NA 138 01/30/2024   K 4.7 01/30/2024   CL 105 01/30/2024   CO2 25 01/30/2024   Lab Results  Component Value Date   ALT 22 01/30/2024   AST 22 01/30/2024   ALKPHOS 95 01/30/2024   BILITOT 0.3 01/30/2024    Lab Results  Component  Value Date   CHOL 235 (H) 01/30/2024   TRIG 108 01/30/2024   HDL 92 01/30/2024   LDLCALC 125 (H) 01/30/2024   Lab Results  Component Value Date   HAV NEG 09/08/2011   Lab Results  Component Value Date   HEPBSAG NEG 03/07/2007   HEPBSAB POS (A) 03/07/2007   Lab Results  Component Value Date   HCVAB NEG 03/07/2007   Lab Results  Component Value Date   CHLAMYDIAWP Negative 08/22/2023   N Negative 08/22/2023   No results found for: "GCPROBEAPT" No results found for: "QUANTGOLD"  Health Maintenance: Immunization History  Administered Date(s) Administered   Hepatitis A, Adult 09/19/2023   Influenza Split 09/08/2011, 08/14/2012   Influenza Whole 08/28/2007, 09/09/2007, 09/14/2008, 09/24/2009, 09/12/2010   Influenza, Seasonal, Injecte, Preservative Fre 08/22/2023   Influenza,inj,Quad PF,6+ Mos 08/26/2014, 08/18/2015, 09/13/2016, 07/18/2017, 08/15/2018, 08/21/2019, 09/13/2020, 08/19/2021, 08/30/2022   Influenza-Unspecified 08/25/2013   PFIZER(Purple Top)SARS-COV-2 Vaccination 12/03/2019, 12/24/2019, 09/13/2020    PPD Test 01/29/2015, 02/04/2016, 07/01/2016, 11/16/2017, 12/01/2017   Pneumococcal Conjugate-13 04/29/2015   Pneumococcal Polysaccharide-23 08/28/2007, 01/29/2012, 01/13/2021   Tdap 08/26/2014    Assessment/Plan: # HIV, well controlled in a heterosexual female  - will switch atripla to [employer] from 3/5 discussed    - Fu in 8 weeks due to switch of ART   # Hyperlipidemia/HTN/Asthma/Obesity/Prediabetes - on atenolol, on flax seed oil, rosuvastatin - Fu with PCP  # Anxiety/Depression - stable   # Immunization  - Hep A # 2 next visit, discussed about getting shingles vaccine at outside pharmacy   # Health maintenance - Mammogram 09/2023 negative, screening mammogram in a year 08/2023 - Colonoscopy in 2014 ( cannot see reports) fu with PCP for colon ca screening as indicated  - She was told she no longer needs Pap smear due to her age by her PCP - [facility]  Patient's labs were reviewed as well as his previous records. Patients questions were addressed and answered. Safe sex counseling done.  I have personally spent 31 minutes involved in face-to-face and non-face-to-face activities for this patient on the day of the visit. Professional time spent includes the following activities: Preparing to see the patient (review of tests), Obtaining and/or reviewing separately obtained history (admission/discharge record), Performing a medically appropriate examination and/or evaluation , Ordering medications/tests/procedures, referring and communicating with other health care professionals, Documenting clinical information in the EMR, Independently interpreting results (not separately reported), Communicating results to the patient/family/caregiver, Counseling and educating the patient/family/caregiver and Care coordination (not separately reported).   Electronically signed by:  Odette Fraction, MD Infectious Disease Physician [facility] for  Infectious Disease [address] [place], [place] [address] Phone: [phone]  Fax: [phone]

## 2024-02-13 ENCOUNTER — Ambulatory Visit: Payer: Self-pay | Admitting: Infectious Diseases

## 2024-02-13 ENCOUNTER — Other Ambulatory Visit: Payer: Self-pay | Admitting: Nurse Practitioner

## 2024-02-13 ENCOUNTER — Other Ambulatory Visit: Payer: Self-pay

## 2024-02-13 DIAGNOSIS — M7541 Impingement syndrome of right shoulder: Secondary | ICD-10-CM | POA: Diagnosis not present

## 2024-02-13 DIAGNOSIS — M7542 Impingement syndrome of left shoulder: Secondary | ICD-10-CM | POA: Diagnosis not present

## 2024-02-13 DIAGNOSIS — J4520 Mild intermittent asthma, uncomplicated: Secondary | ICD-10-CM

## 2024-02-13 MED ORDER — ALBUTEROL SULFATE HFA 108 (90 BASE) MCG/ACT IN AERS
1.0000 | INHALATION_SPRAY | RESPIRATORY_TRACT | 11 refills | Status: DC | PRN
Start: 2024-02-13 — End: 2024-09-03

## 2024-02-13 NOTE — Telephone Encounter (Signed)
 Copied from CRM [id number]. Topic: Clinical - Medication Refill >> Feb 13, 2024  9:52 AM Carlatta H wrote: Most Recent Primary Care Visit:  Provider: Ivonne Andrew  Department: [facility]  Visit Type: OFFICE VISIT  Date: 01/30/2024  Medication: albuterol (VENTOLIN HFA) 108 (90 Base) MCG/ACT inhaler [[id number]]  Has the patient contacted their pharmacy? No (Agent: If no, request that the patient contact the pharmacy for the refill. If patient does not wish to contact the pharmacy document the reason why and proceed with request.) (Agent: If yes, when and what did the pharmacy advise?)  Is this the correct pharmacy for this prescription? Yes If no, delete pharmacy and type the correct one.  This is the patient's preferred pharmacy:    [id number] - [place], [place] - [address] AT [facility] [address] [place] [address] Phone: [phone] Fax: [phone]   Has the prescription been filled recently? No  Is the patient out of the medication? Yes  Has the patient been seen for an appointment in the last year OR does the patient have an upcoming appointment? Yes  Can we respond through MyChart? No  Agent: Please be advised that Rx refills may take up to 3 business days. We ask that you follow-up with your pharmacy.

## 2024-03-03 ENCOUNTER — Other Ambulatory Visit: Payer: Self-pay | Admitting: Nurse Practitioner

## 2024-03-03 DIAGNOSIS — F411 Generalized anxiety disorder: Secondary | ICD-10-CM

## 2024-03-03 MED ORDER — LORAZEPAM 0.5 MG PO TABS: ORAL_TABLET | ORAL | 0 refills | Status: DC

## 2024-03-12 DIAGNOSIS — M3501 Sicca syndrome with keratoconjunctivitis: Secondary | ICD-10-CM | POA: Diagnosis not present

## 2024-03-12 DIAGNOSIS — H2513 Age-related nuclear cataract, bilateral: Secondary | ICD-10-CM | POA: Diagnosis not present

## 2024-03-12 DIAGNOSIS — E119 Type 2 diabetes mellitus without complications: Secondary | ICD-10-CM | POA: Diagnosis not present

## 2024-03-20 ENCOUNTER — Other Ambulatory Visit (HOSPITAL_COMMUNITY): Payer: Self-pay

## 2024-03-20 ENCOUNTER — Other Ambulatory Visit: Payer: Self-pay | Admitting: Nurse Practitioner

## 2024-03-20 DIAGNOSIS — G4700 Insomnia, unspecified: Secondary | ICD-10-CM

## 2024-03-20 DIAGNOSIS — F3342 Major depressive disorder, recurrent, in full remission: Secondary | ICD-10-CM

## 2024-03-20 DIAGNOSIS — F4310 Post-traumatic stress disorder, unspecified: Secondary | ICD-10-CM

## 2024-03-20 MED ORDER — HYDROXYZINE HCL 25 MG PO TABS
25.0000 mg | ORAL_TABLET | Freq: Three times a day (TID) | ORAL | 0 refills | Status: DC | PRN
  Filled 2024-03-20: qty 60, 20d supply, fill #0

## 2024-03-28 ENCOUNTER — Other Ambulatory Visit: Payer: Self-pay | Admitting: Nurse Practitioner

## 2024-04-09 DIAGNOSIS — M170 Bilateral primary osteoarthritis of knee: Secondary | ICD-10-CM | POA: Diagnosis not present

## 2024-04-23 ENCOUNTER — Encounter: Payer: Self-pay | Admitting: Infectious Diseases

## 2024-04-23 ENCOUNTER — Ambulatory Visit (INDEPENDENT_AMBULATORY_CARE_PROVIDER_SITE_OTHER): Admitting: Infectious Diseases

## 2024-04-23 ENCOUNTER — Other Ambulatory Visit: Payer: Self-pay

## 2024-04-23 VITALS — BP 121/89 | HR 64 | Resp 16 | Ht 65.0 in | Wt 213.0 lb

## 2024-04-23 DIAGNOSIS — B20 Human immunodeficiency virus [HIV] disease: Secondary | ICD-10-CM

## 2024-04-23 DIAGNOSIS — Z23 Encounter for immunization: Secondary | ICD-10-CM

## 2024-04-23 DIAGNOSIS — Z79899 Other long term (current) drug therapy: Secondary | ICD-10-CM | POA: Insufficient documentation

## 2024-04-23 NOTE — Progress Notes (Unsigned)
 [address], [place], [place], [address]                                                                  Phn. 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  Last seen 02/06/24 and was switched from Atripla to [place]. Reports compliance with Biktarvy without missed doses or any concerns. She is happy about the switch to biktarvy as it is smaller pill. No new changes since last visit. She is planning to go for a cruise in June. Willing to get Hep A # 2 nd dose. No other concerns.   ROS: As stated in above HPI; all other systems were reviewed and are otherwise negative unless noted below  No reported fever / chills, night sweats, unintentional weight loss, acute visual change, odynophagia, chest pain/pressure, new or worsened SOB or WOB, nausea, vomiting, diarrhea, dysuria, GU discharge, syncope, seizures, red/hot swollen joints, hallucinations / delusions, rashes, new allergies, unusual / excessive bleeding, swollen lymph nodes, or new hospitalizations/ED visits/Urgent Care visits since the pt was last seen.  PMH/ PSH/ FamHx / Social Hx , medications and allergies reviewed and updated as appropriate; please see corresponding tab in EHR / prior notes                                        Current Outpatient Medications on File Prior to Visit  Medication Sig Dispense Refill   albuterol  (VENTOLIN  HFA) 108 (90  Base) MCG/ACT inhaler Inhale 1 puff into the lungs every 4 (four) hours as needed for shortness of breath. 18 g 11   ARNUITY ELLIPTA  100 MCG/ACT AEPB INHALE 1 PUFF INTO THE LUNGS DAILY 30 each 2   atenolol  (TENORMIN ) 25 MG tablet TAKE 1 TABLET(25 MG) BY MOUTH DAILY 30 tablet 2   bictegravir-emtricitabine-tenofovir  AF (BIKTARVY) 50-200-25 MG TABS tablet Take 1 tablet by mouth daily. 30 tablet 5   cetirizine  (ZYRTEC ) 10 MG tablet TAKE 1 TABLET(10 MG) BY MOUTH AT BEDTIME 30 tablet 3   cyanocobalamin  (VITAMIN B12) 1000 MCG tablet TAKE 1 TABLET(1,000 MCG) BY MOUTH DAILY 30  tablet 2   cyclobenzaprine  (FLEXERIL ) 10 MG tablet TAKE 1 TABLET(10 MG) BY MOUTH THREE TIMES DAILY AS NEEDED FOR MUSCLE SPASMS 30 tablet 0   Flaxseed, Linseed, (FLAX SEED OIL) 1000 MG CAPS Take by mouth.     fluticasone  (FLONASE ) 50 MCG/ACT nasal spray SHAKE LIQUID AND USE 1 SPRAY IN EACH NOSTRIL EVERY DAY 16 g 5   Fluticasone  Furoate (ARNUITY ELLIPTA ) 100 MCG/ACT AEPB Inhale 1 Inhalation into the lungs daily. 30 each 2   hydrOXYzine  (ATARAX ) 25 MG tablet Take 1 tablet (25 mg total) by mouth every 8 (eight) hours as needed. 60 tablet 0   LORazepam  (ATIVAN ) 0.5 MG tablet TAKE 1 TABLET(0.5 MG) BY MOUTH TWICE DAILY AS NEEDED FOR ANXIETY 30 tablet 0   rosuvastatin  (CRESTOR ) 5 MG tablet TAKE 1 TABLET(5 MG) BY MOUTH DAILY 30 tablet 11   SF 5000 PLUS 1.1 % CREA dental cream Take by mouth at bedtime.     triamcinolone  cream (KENALOG ) 0.1 % Apply 1 Application topically 2 (two) times daily. 30 g 0   Vitamin D , Ergocalciferol , (DRISDOL ) 1.25 MG (50000 UNIT) CAPS capsule Take 1 capsule (50,000 Units total) by mouth every 7 (seven) days. (Patient not taking: Reported on 04/23/2024) 5 capsule 2   No current facility-administered medications on file prior to visit.     No Known Allergies  Past Medical History:  Diagnosis Date   Allergy    Asthma    Back pain    GERD (gastroesophageal reflux disease)    HIV infection (HCC)     Hyperlipidemia    Osteoarthritis of both knees    Tachycardia y-12   Pt was under stress at the time   Past Surgical History:  Procedure Laterality Date   COLONOSCOPY WITH PROPOFOL  N/A 04/22/2013   Procedure: COLONOSCOPY WITH PROPOFOL ;  Surgeon: Garrett Kallman, MD;  Location: [facility];  Service: Endoscopy;  Laterality: N/A;   NO PAST SURGERIES     Social History   Socioeconomic History   Marital status: Single    Spouse name: Not on file   Number of children: Not on file   Years of education: Not on file   Highest education level: Bachelor's degree (e.g., BA, AB, BS)  Occupational History   Not on file  Tobacco Use   Smoking status: Never    Passive exposure: Never   Smokeless tobacco: Never  Vaping Use   Vaping status: Never Used  Substance and Sexual Activity   Alcohol use: Yes    Comment: occasional   Drug use: Not Currently    Types: Marijuana    Comment: cannabis several years ago   Sexual activity: Never    Partners: Male    Comment: declined condoms  Other Topics Concern   Not on file  Social History Narrative   Not on file   Social Drivers of Health   Financial Resource Strain: Low Risk  (11/22/2022)   Overall Financial Resource Strain (CARDIA)    Difficulty of Paying Living Expenses: Not very hard  Food Insecurity: No Food Insecurity (11/22/2022)   Hunger Vital Sign    Worried About Running Out of Food in the Last Year: Never true    Ran Out of Food in the Last Year: Never true  Transportation Needs: No Transportation Needs (11/22/2022)   PRAPARE - Administrator, Civil Service (Medical): No    Lack of Transportation (Non-Medical): No  Physical Activity: Sufficiently Active (11/22/2022)   Exercise Vital Sign  Days of Exercise per Week: 7 days    Minutes of Exercise per Session: 60 min  Stress: Stress Concern Present (11/22/2022)   [employer] of Occupational Health - Occupational Stress Questionnaire    Feeling of Stress :  To some extent  Social Connections: Moderately Integrated (11/22/2022)   Social Connection and Isolation Panel [NHANES]    Frequency of Communication with Friends and Family: More than three times a week    Frequency of Social Gatherings with Friends and Family: More than three times a week    Attends Religious Services: More than 4 times per year    Active Member of [employer] or Organizations: Yes    Attends Banker Meetings: Never    Marital Status: Never married  Intimate Partner Violence: Not At Risk (11/22/2022)   Humiliation, Afraid, Rape, and Kick questionnaire    Fear of Current or Ex-Partner: No    Emotionally Abused: No    Physically Abused: No    Sexually Abused: No   Family History  Problem Relation Age of Onset   Alcohol abuse Mother    Heart disease Mother    Anemia Mother    Hypokalemia Mother    Schizophrenia Mother    Osteoarthritis Father    Diabetes Father    Hypertension Father    Goiter Father    Mental illness Father    Diabetes Brother    Breast cancer Neg Hx     Vitals  Resp 16   Ht 5\' 5"  (1.651 m)   BMI 35.45 kg/m   Examination  Gen: no acute distress, Obese  HEENT: [place]/AT, no scleral icterus, no pale conjunctivae, hearing normal, oral mucosa moist  Neck: Supple Cardio: Regular rate and rhythm Resp: Pulmonary effort normal in room air GI: nondistended GU: Musc: Extremities: No pedal edema Skin: No rashes Neuro: grossly non focal , awake, alert and oriented * 3, ambulatory, follows commands.   Psych: Calm, cooperative  Lab Results HIV 1 RNA Quant (Copies/mL)  Date Value  01/30/2024 <20 (H)  08/22/2023 <20 (H)  02/14/2023 Not Detected   CD4 T Cell Abs (/uL)  Date Value  01/30/2024 1,499  08/22/2023 1,380  02/14/2023 1,322   No results found for: "HIV1GENOSEQ" Lab Results  Component Value Date   WBC 6.7 01/30/2024   HGB 13.8 01/30/2024   HCT 40.8 01/30/2024   MCV 94.0 01/30/2024   PLT 321 01/30/2024    Lab Results   Component Value Date   CREATININE 0.63 01/30/2024   BUN 15 01/30/2024   NA 138 01/30/2024   K 4.7 01/30/2024   CL 105 01/30/2024   CO2 25 01/30/2024   Lab Results  Component Value Date   ALT 22 01/30/2024   AST 22 01/30/2024   ALKPHOS 95 01/30/2024   BILITOT 0.3 01/30/2024    Lab Results  Component Value Date   CHOL 235 (H) 01/30/2024   TRIG 108 01/30/2024   HDL 92 01/30/2024   LDLCALC 125 (H) 01/30/2024   Lab Results  Component Value Date   HAV NEG 09/08/2011   Lab Results  Component Value Date   HEPBSAG NEG 03/07/2007   HEPBSAB POS (A) 03/07/2007   Lab Results  Component Value Date   HCVAB NEG 03/07/2007   Lab Results  Component Value Date   CHLAMYDIAWP Negative 08/22/2023   N Negative 08/22/2023   No results found for: "GCPROBEAPT" No results found for: "QUANTGOLD"  Health Maintenance: Immunization History  Administered Date(s) Administered  Hepatitis A, Adult 09/19/2023   Influenza Split 09/08/2011, 08/14/2012   Influenza Whole 08/28/2007, 09/09/2007, 09/14/2008, 09/24/2009, 09/12/2010   Influenza, Seasonal, Injecte, Preservative Fre 08/22/2023   Influenza,inj,Quad PF,6+ Mos 08/26/2014, 08/18/2015, 09/13/2016, 07/18/2017, 08/15/2018, 08/21/2019, 09/13/2020, 08/19/2021, 08/30/2022   Influenza-Unspecified 08/25/2013   PFIZER(Purple Top)SARS-COV-2 Vaccination 12/03/2019, 12/24/2019, 09/13/2020   PPD Test 01/29/2015, 02/04/2016, 07/01/2016, 11/16/2017, 12/01/2017   Pneumococcal Conjugate-13 04/29/2015   Pneumococcal Polysaccharide-23 08/28/2007, 01/29/2012, 01/13/2021   Tdap 08/26/2014    Assessment/Plan: # HIV, well controlled in a heterosexual female  - continue Biktarvy  - HIV RNA today  - Fu in 5 months   # Hyperlipidemia/HTN/Asthma/Obesity/Prediabetes - on atenolol , on flax seed oil, rosuvastatin  - Stable, Fu with PCP  # Anxiety/Depression - stable   # Immunization  - Hep A # 2 today   # Health maintenance - Mammogram 09/2023  negative, screening mammogram in a year 08/2023 - Colonoscopy in 2014 ( cannot see reports) fu with PCP for colon ca screening as indicated  - She was told she no longer needs Pap smear due to her age by her PCP - Follows with dental care at [facility]  Patient's labs were reviewed as well as his previous records. Patients questions were addressed and answered. Safe sex counseling done.  I have personally spent 20 minutes involved in face-to-face and non-face-to-face activities for this patient on the day of the visit. Professional time spent includes the following activities: Preparing to see the patient (review of tests), Performing a medically appropriate examination and/or evaluation , Ordering labs, Documenting clinical information in the EMR,  Communicating results to the patient, Counseling and educating the patient and Care coordination (not separately reported).   Electronically signed by:  Terre Ferri, MD Infectious Disease Physician [facility] for Infectious Disease [address] [place], [place] [address] Phone: [phone]  Fax: [phone]

## 2024-04-25 ENCOUNTER — Other Ambulatory Visit: Payer: Self-pay | Admitting: Nurse Practitioner

## 2024-04-25 DIAGNOSIS — Z23 Encounter for immunization: Secondary | ICD-10-CM | POA: Insufficient documentation

## 2024-04-25 DIAGNOSIS — E559 Vitamin D deficiency, unspecified: Secondary | ICD-10-CM

## 2024-04-25 DIAGNOSIS — F411 Generalized anxiety disorder: Secondary | ICD-10-CM

## 2024-04-25 MED ORDER — VITAMIN D (ERGOCALCIFEROL) 1.25 MG (50000 UNIT) PO CAPS: 50000.0000 [IU] | ORAL_CAPSULE | ORAL | 2 refills | Status: DC

## 2024-04-25 NOTE — Telephone Encounter (Signed)
 Copied from CRM [id number]. Topic: Clinical - Medication Refill >> Apr 25, 2024 12:56 PM Lizabeth Riggs wrote: Medication: Vitamin D , Ergocalciferol , (DRISDOL ) 1.25 MG (50000 UNIT) CAPS capsule [[id number]]  Has the patient contacted their pharmacy? Yes (Agent: If no, request that the patient contact the pharmacy for the refill. If patient does not wish to contact the pharmacy document the reason why and proceed with request.) (Agent: If yes, when and what did the pharmacy advise?) Pharmacy needs order to refill  This is the patient's preferred pharmacy:   [id number] - [place], [place] - [address] AT [facility] [address] [facility] [address] [place] [address] Phone: [phone] Fax: [phone]  Is this the correct pharmacy for this prescription? Yes If no, delete pharmacy and type the correct one.   Has the prescription been filled recently? No  Is the patient out of the medication? Yes - She has been out for about 2 weeks  Has the patient been seen for an appointment in the last year OR does the patient have an upcoming appointment? Yes  Can we respond through MyChart? Yes  Agent: Please be advised that Rx refills may take up to 3 business days. We ask that you follow-up with your pharmacy.

## 2024-04-26 LAB — HIV RNA, RTPCR W/R GT (RTI, PI,INT)
HIV 1 RNA Quant: 25 {copies}/mL — ABNORMAL HIGH
HIV-1 RNA Quant, Log: 1.4 {Log_copies}/mL — ABNORMAL HIGH

## 2024-04-28 ENCOUNTER — Ambulatory Visit: Payer: Self-pay | Admitting: Infectious Diseases

## 2024-04-30 ENCOUNTER — Other Ambulatory Visit: Payer: Self-pay

## 2024-04-30 ENCOUNTER — Ambulatory Visit

## 2024-04-30 DIAGNOSIS — M7542 Impingement syndrome of left shoulder: Secondary | ICD-10-CM | POA: Diagnosis not present

## 2024-04-30 DIAGNOSIS — M7541 Impingement syndrome of right shoulder: Secondary | ICD-10-CM | POA: Diagnosis not present

## 2024-05-20 ENCOUNTER — Other Ambulatory Visit: Payer: Self-pay | Admitting: Nurse Practitioner

## 2024-05-20 DIAGNOSIS — F3342 Major depressive disorder, recurrent, in full remission: Secondary | ICD-10-CM

## 2024-05-20 DIAGNOSIS — F4310 Post-traumatic stress disorder, unspecified: Secondary | ICD-10-CM

## 2024-05-20 DIAGNOSIS — G4700 Insomnia, unspecified: Secondary | ICD-10-CM

## 2024-05-21 ENCOUNTER — Ambulatory Visit: Payer: Self-pay | Admitting: Nurse Practitioner

## 2024-05-23 ENCOUNTER — Other Ambulatory Visit: Payer: Self-pay | Admitting: Nurse Practitioner

## 2024-06-18 ENCOUNTER — Ambulatory Visit: Payer: Self-pay | Admitting: Nurse Practitioner

## 2024-06-18 DIAGNOSIS — M1990 Unspecified osteoarthritis, unspecified site: Secondary | ICD-10-CM | POA: Diagnosis not present

## 2024-06-18 DIAGNOSIS — E1169 Type 2 diabetes mellitus with other specified complication: Secondary | ICD-10-CM | POA: Diagnosis not present

## 2024-06-18 DIAGNOSIS — I10 Essential (primary) hypertension: Secondary | ICD-10-CM | POA: Diagnosis not present

## 2024-06-18 DIAGNOSIS — E785 Hyperlipidemia, unspecified: Secondary | ICD-10-CM | POA: Diagnosis not present

## 2024-06-18 DIAGNOSIS — F411 Generalized anxiety disorder: Secondary | ICD-10-CM | POA: Diagnosis not present

## 2024-06-18 DIAGNOSIS — J4540 Moderate persistent asthma, uncomplicated: Secondary | ICD-10-CM | POA: Diagnosis not present

## 2024-06-18 DIAGNOSIS — Z008 Encounter for other general examination: Secondary | ICD-10-CM | POA: Diagnosis not present

## 2024-06-18 DIAGNOSIS — F3342 Major depressive disorder, recurrent, in full remission: Secondary | ICD-10-CM | POA: Diagnosis not present

## 2024-06-18 DIAGNOSIS — Z6836 Body mass index (BMI) 36.0-36.9, adult: Secondary | ICD-10-CM | POA: Diagnosis not present

## 2024-07-01 ENCOUNTER — Other Ambulatory Visit: Payer: Self-pay | Admitting: Nurse Practitioner

## 2024-07-01 DIAGNOSIS — Z1231 Encounter for screening mammogram for malignant neoplasm of breast: Secondary | ICD-10-CM

## 2024-07-09 ENCOUNTER — Other Ambulatory Visit: Payer: Self-pay | Admitting: Nurse Practitioner

## 2024-07-09 ENCOUNTER — Ambulatory Visit: Payer: Self-pay | Admitting: Nurse Practitioner

## 2024-07-09 DIAGNOSIS — G4700 Insomnia, unspecified: Secondary | ICD-10-CM

## 2024-07-09 DIAGNOSIS — F4310 Post-traumatic stress disorder, unspecified: Secondary | ICD-10-CM

## 2024-07-09 DIAGNOSIS — F3342 Major depressive disorder, recurrent, in full remission: Secondary | ICD-10-CM

## 2024-07-10 ENCOUNTER — Telehealth: Payer: Self-pay | Admitting: Nurse Practitioner

## 2024-07-10 NOTE — Telephone Encounter (Signed)
 Caller & Relationship to patient:   MRN #  [id number]   Call Back Number:   Date of Last Office Visit: 07/09/2024     Date of Next Office Visit: 07/17/2024    Medication(s) to be Refilled: Hydroxyzine    Preferred Pharmacy: [facility] [address] [place]   ** Please notify patient to allow 48-72 hours to process** **Let patient know to contact pharmacy at the end of the day to make sure medication is ready. ** **If patient has not been seen in a year or longer, book an appointment **Advise to use MyChart for refill requests OR to contact their pharmacy

## 2024-07-13 ENCOUNTER — Other Ambulatory Visit: Payer: Self-pay | Admitting: Infectious Diseases

## 2024-07-16 ENCOUNTER — Encounter: Payer: Self-pay | Admitting: Nurse Practitioner

## 2024-07-16 ENCOUNTER — Other Ambulatory Visit: Payer: Self-pay | Admitting: Nurse Practitioner

## 2024-07-16 DIAGNOSIS — G4700 Insomnia, unspecified: Secondary | ICD-10-CM

## 2024-07-16 DIAGNOSIS — F4310 Post-traumatic stress disorder, unspecified: Secondary | ICD-10-CM

## 2024-07-16 DIAGNOSIS — F3342 Major depressive disorder, recurrent, in full remission: Secondary | ICD-10-CM

## 2024-07-16 MED ORDER — HYDROXYZINE HCL 25 MG PO TABS: 25.0000 mg | ORAL_TABLET | Freq: Every evening | ORAL | 0 refills | Status: DC | PRN

## 2024-07-17 ENCOUNTER — Ambulatory Visit: Payer: Self-pay

## 2024-07-17 VITALS — Ht 65.0 in | Wt 213.0 lb

## 2024-07-17 DIAGNOSIS — Z0000 Encounter for general adult medical examination without abnormal findings: Secondary | ICD-10-CM | POA: Diagnosis not present

## 2024-07-17 NOTE — Progress Notes (Signed)
 Subjective:   Sheri Davis is a 63 y.o. female who presents for Medicare Annual (Subsequent) preventive examination.  Visit Complete: Virtual I connected with  Sheri Davis on 07/17/24 by a audio enabled telemedicine application and verified that I am speaking with the correct person using two identifiers.  Patient Location: Home  Provider Location: Office/Clinic  I discussed the limitations of evaluation and management by telemedicine. The patient expressed understanding and agreed to proceed.  Vital Signs: Because this visit was a virtual/telehealth visit, some criteria may be missing or patient reported. Any vitals not documented were not able to be obtained and vitals that have been documented are patient reported.  Patient Medicare AWV questionnaire was completed by the patient on 07/17/24; I have confirmed that all information answered by patient is correct and no changes since this date.  Cardiac Risk Factors include: hypertension     Objective:    Today's Vitals   07/17/24 1552  Weight: 213 lb (96.6 kg)  Height: 5' 5 (1.651 m)   Body mass index is 35.45 kg/m.     07/17/2024    4:06 PM 11/22/2022    8:41 AM 09/06/2022    1:30 PM 01/30/2017    8:35 AM 09/13/2016    8:35 AM 05/04/2016   11:33 AM 04/11/2016    9:38 AM  Advanced Directives  Does Patient Have a Medical Advance Directive? Yes Yes Yes Yes   No  No   Type of Advance Directive Living will Healthcare Power of [place];Living will Living will Healthcare Power of [place];Living will     Does patient want to make changes to medical advance directive?  No - Patient declined       Copy of Healthcare Power of Attorney in Chart?  No - copy requested  Yes      Would patient like information on creating a medical advance directive?     Yes - Educational materials given  --  --      Data saved with a previous flowsheet row definition    Current Medications (verified) Outpatient Encounter  Medications as of 07/17/2024  Medication Sig   albuterol  (VENTOLIN  HFA) 108 (90 Base) MCG/ACT inhaler Inhale 1 puff into the lungs every 4 (four) hours as needed for shortness of breath.   ARNUITY ELLIPTA  100 MCG/ACT AEPB INHALE 1 PUFF INTO THE LUNGS DAILY   atenolol  (TENORMIN ) 25 MG tablet TAKE 1 TABLET(25 MG) BY MOUTH DAILY   BIKTARVY 50-200-25 MG TABS tablet TAKE 1 TABLET BY MOUTH DAILY   cetirizine  (ZYRTEC ) 10 MG tablet TAKE 1 TABLET(10 MG) BY MOUTH AT BEDTIME   cyanocobalamin  (VITAMIN B12) 1000 MCG tablet TAKE 1 TABLET(1,000 MCG) BY MOUTH DAILY   cyclobenzaprine  (FLEXERIL ) 10 MG tablet TAKE 1 TABLET(10 MG) BY MOUTH THREE TIMES DAILY AS NEEDED FOR MUSCLE SPASMS   Flaxseed, Linseed, (FLAX SEED OIL) 1000 MG CAPS Take by mouth.   fluticasone  (FLONASE ) 50 MCG/ACT nasal spray SHAKE LIQUID AND USE 1 SPRAY IN EACH NOSTRIL EVERY DAY   Fluticasone  Furoate (ARNUITY ELLIPTA ) 100 MCG/ACT AEPB Inhale 1 Inhalation into the lungs daily.   hydrOXYzine  (ATARAX ) 25 MG tablet Take 1 tablet (25 mg total) by mouth at bedtime as needed.   LORazepam  (ATIVAN ) 0.5 MG tablet TAKE 1 TABLET(0.5 MG) BY MOUTH TWICE DAILY AS NEEDED FOR ANXIETY   rosuvastatin  (CRESTOR ) 5 MG tablet TAKE 1 TABLET(5 MG) BY MOUTH DAILY   triamcinolone  cream (KENALOG ) 0.1 % Apply 1 Application topically 2 (two) times daily.  SF 5000 PLUS 1.1 % CREA dental cream Take by mouth at bedtime.   Vitamin D , Ergocalciferol , (DRISDOL ) 1.25 MG (50000 UNIT) CAPS capsule Take 1 capsule (50,000 Units total) by mouth every 7 (seven) days. (Patient not taking: Reported on 07/17/2024)   No facility-administered encounter medications on file as of 07/17/2024.    Allergies (verified) Patient has no known allergies.   History: Past Medical History:  Diagnosis Date   Allergy    Anxiety 2004   Asthma 2004   Back pain    Depression Entire life   Sporadic   GERD (gastroesophageal reflux disease) 2004   HIV infection (HCC) 2006   Hyperlipidemia 2006    Hypertension 2004   Osteoarthritis of both knees    Tachycardia y-12   Pt was under stress at the time   Past Surgical History:  Procedure Laterality Date   COLONOSCOPY WITH PROPOFOL  N/A 04/22/2013   Procedure: COLONOSCOPY WITH PROPOFOL ;  Surgeon: Gladis MARLA Louder, MD;  Location: [facility];  Service: Endoscopy;  Laterality: N/A;   NO PAST SURGERIES     Family History  Problem Relation Age of Onset   Alcohol abuse Mother    Heart disease Mother    Anemia Mother    Hypokalemia Mother    Schizophrenia Mother    Depression Mother    Osteoarthritis Father    Diabetes Father    Hypertension Father    Goiter Father    Mental illness Father    Diabetes Brother    Breast cancer Neg Hx    Social History   Socioeconomic History   Marital status: Single    Spouse name: Not on file   Number of children: Not on file   Years of education: Not on file   Highest education level: Bachelor's degree (e.g., BA, AB, BS)  Occupational History   Not on file  Tobacco Use   Smoking status: Never    Passive exposure: Never   Smokeless tobacco: Never  Vaping Use   Vaping status: Never Used  Substance and Sexual Activity   Alcohol use: Yes    Comment: occasional   Drug use: Not Currently    Types: Marijuana    Comment: cannabis several years ago   Sexual activity: Never    Partners: Male    Comment: declined condoms  Other Topics Concern   Not on file  Social History Narrative   Not on file   Social Drivers of Health   Financial Resource Strain: Low Risk  (07/17/2024)   Overall Financial Resource Strain (CARDIA)    Difficulty of Paying Living Expenses: Not hard at all  Food Insecurity: No Food Insecurity (07/17/2024)   Hunger Vital Sign    Worried About Running Out of Food in the Last Year: Never true    Ran Out of Food in the Last Year: Never true  Transportation Needs: No Transportation Needs (07/17/2024)   PRAPARE - Administrator, Civil Service (Medical): No     Lack of Transportation (Non-Medical): No  Physical Activity: Sufficiently Active (07/17/2024)   Exercise Vital Sign    Days of Exercise per Week: 7 days    Minutes of Exercise per Session: 60 min  Stress: No Stress Concern Present (07/17/2024)   [employer] of Occupational Health - Occupational Stress Questionnaire    Feeling of Stress: Only a little  Social Connections: Moderately Integrated (07/17/2024)   Social Connection and Isolation Panel    Frequency of Communication with Friends and  Family: More than three times a week    Frequency of Social Gatherings with Friends and Family: More than three times a week    Attends Religious Services: More than 4 times per year    Active Member of [employer] or Organizations: Yes    Attends Banker Meetings: 1 to 4 times per year    Marital Status: Never married    Tobacco Counseling Counseling given: Not Answered   Clinical Intake:  Pre-visit preparation completed: No  Pain : No/denies pain     BMI - recorded: 35.45 Nutritional Status: BMI > 30  Obese Nutritional Risks: None Diabetes: No  How often do you need to have someone help you when you read instructions, pamphlets, or other written materials from your doctor or pharmacy?: 1 - Never What is the last grade level you completed in school?: college  Interpreter Needed?: No  Information entered by :: Suzen Shove   Activities of Daily Living    07/17/2024    3:55 PM  In your present state of health, do you have any difficulty performing the following activities:  Hearing? 0  Vision? 0  Difficulty concentrating or making decisions? 0  Walking or climbing stairs? 0  Dressing or bathing? 0  Doing errands, shopping? 0  Preparing Food and eating ? N  Using the Toilet? N  In the past six months, have you accidently leaked urine? N  Do you have problems with loss of bowel control? N  Managing your Medications? N  Managing your Finances? N  Housekeeping or  managing your Housekeeping? N    Patient Care Team: Oley Bascom RAMAN, NP as PCP - General (Pulmonary Disease) Eben Reyes BROCKS, MD as Consulting Physician (Infectious Diseases) Cindie Jesusa HERO, RN as Registered Nurse Dannielle Arlean FALCON, RN (Inactive) as Registered Nurse  Indicate any recent Medical Services you may have received from other than [facility] providers in the past year (date may be approximate).     Assessment:   This is a routine wellness examination for Sheri Davis.  Hearing/Vision screen No results found.   Goals Addressed             This Visit's Progress    Weight (lb) < 200 lb (90.7 kg)   213 lb (96.6 kg)      Depression Screen    07/17/2024    4:03 PM 04/23/2024    8:36 AM 02/06/2024    9:54 AM 01/30/2024    9:12 AM 10/17/2023    9:13 AM 09/19/2023    2:07 PM 03/21/2023    1:02 PM  PHQ 2/9 Scores  PHQ - 2 Score 1 0 0 0 0 0   PHQ- 9 Score    0 0       Information is confidential and restricted. Go to Review Flowsheets to unlock data.    Fall Risk    07/17/2024    4:07 PM 04/23/2024    8:36 AM 02/06/2024    9:54 AM 01/30/2024    9:14 AM 10/17/2023    9:14 AM  Fall Risk   Falls in the past year? 0 1 0 0   Number falls in past yr: 0 1 0 0 0  Injury with Fall? 0 1 0 0 0  Risk for fall due to : No Fall Risks  No Fall Risks No Fall Risks   Follow up Falls evaluation completed  Falls evaluation completed Falls evaluation completed     MEDICARE RISK AT HOME: Medicare  Risk at Home Any stairs in or around the home?: Yes If so, are there any without handrails?: Yes Home free of loose throw rugs in walkways, pet beds, electrical cords, etc?: Yes Adequate lighting in your home to reduce risk of falls?: Yes Life alert?: No Use of a cane, walker or w/c?: No Grab bars in the bathroom?: No Shower chair or bench in shower?: No Elevated toilet seat or a handicapped toilet?: Yes  TIMED UP AND GO:  Was the test performed?  No    Cognitive Function:         07/17/2024    4:09 PM 11/22/2022    8:42 AM  6CIT Screen  What Year? 0 points 0 points  What month? 0 points 0 points  What time? 0 points 0 points  Count back from 20 0 points 0 points  Months in reverse 0 points 0 points  Repeat phrase 0 points 0 points  Total Score 0 points 0 points    Immunizations Immunization History  Administered Date(s) Administered   Hepatitis A, Adult 09/19/2023, 04/23/2024   Influenza Split 09/08/2011, 08/14/2012   Influenza Whole 08/28/2007, 09/09/2007, 09/14/2008, 09/24/2009, 09/12/2010   Influenza, Seasonal, Injecte, Preservative Fre 08/22/2023   Influenza,inj,Quad PF,6+ Mos 08/26/2014, 08/18/2015, 09/13/2016, 07/18/2017, 08/15/2018, 08/21/2019, 09/13/2020, 08/19/2021, 08/30/2022   Influenza-Unspecified 08/25/2013   PFIZER(Purple Top)SARS-COV-2 Vaccination 12/03/2019, 12/24/2019, 09/13/2020   PPD Test 01/29/2015, 02/04/2016, 07/01/2016, 11/16/2017, 12/01/2017   Pneumococcal Conjugate-13 04/29/2015   Pneumococcal Polysaccharide-23 08/28/2007, 01/29/2012, 01/13/2021   Tdap 08/26/2014    TDAP status: Up to date  Flu Vaccine status: Up to date  Pneumococcal vaccine status: Up to date  Covid-19 vaccine status: Declined, Education has been provided regarding the importance of this vaccine but patient still declined. Advised may receive this vaccine at local pharmacy or Health Dept.or vaccine clinic. Aware to provide a copy of the vaccination record if obtained from local pharmacy or Health Dept. Verbalized acceptance and understanding.  Qualifies for Shingles Vaccine? Yes   Zostavax completed No   Shingrix Completed?: No.    Education has been provided regarding the importance of this vaccine. Patient has been advised to call insurance company to determine out of pocket expense if they have not yet received this vaccine. Advised may also receive vaccine at local pharmacy or Health Dept. Verbalized acceptance and understanding.  Screening Tests Health  Maintenance  Topic Date Due   Diabetic kidney evaluation - Urine ACR  02/26/2016   Hepatitis B Vaccines 19-59 Average Risk (1 of 3 - Risk 3-dose series) Never done   COVID-19 Vaccine (4 - 2024-25 season) 07/29/2023   Cervical Cancer Screening (HPV/Pap Cotest)  08/20/2024   Zoster Vaccines- Shingrix (1 of 2) 07/24/2024 (Originally 02/02/1980)   DTaP/Tdap/Td (2 - Td or Tdap) 08/26/2024   MAMMOGRAM  09/25/2024   Diabetic kidney evaluation - eGFR measurement  01/29/2025   LIPID PANEL  01/29/2025   Medicare Annual Wellness (AWV)  07/17/2025   Pneumococcal Vaccine: 50+ Years (4 of 4 - PCV20 or PCV21) 01/13/2026   Fecal DNA (Cologuard)  10/30/2026   Hepatitis C Screening  Completed   HIV Screening  Completed   HPV VACCINES  Aged Out   Meningococcal B Vaccine  Aged Out   FOOT EXAM  Discontinued   HEMOGLOBIN A1C  Discontinued   OPHTHALMOLOGY EXAM  Discontinued   Colonoscopy  Discontinued    Health Maintenance  Health Maintenance Due  Topic Date Due   Diabetic kidney evaluation - Urine ACR  02/26/2016  Hepatitis B Vaccines 19-59 Average Risk (1 of 3 - Risk 3-dose series) Never done   COVID-19 Vaccine (4 - 2024-25 season) 07/29/2023   Cervical Cancer Screening (HPV/Pap Cotest)  08/20/2024    Colorectal cancer screening: Type of screening: Cologuard. Completed 10/31/23. Repeat every 3 years  Mammogram status: Completed 09/26/23. Repeat every year    Lung Cancer Screening: (Low Dose CT Chest recommended if Age 44-80 years, 20 pack-year currently smoking OR have quit w/in 15years.) does not qualify.   Lung Cancer Screening Referral: N/A  Additional Screening:  Hepatitis C Screening: does not qualify; Completed 03/07/2007  Vision Screening: Recommended annual ophthalmology exams for early detection of glaucoma and other disorders of the eye. Is the patient up to date with their annual eye exam?  Yes  Who is the provider or what is the name of the office in which the patient attends  annual eye exams? [facility] If pt is not established with a provider, would they like to be referred to a provider to establish care? No .   Dental Screening: Recommended annual dental exams for proper oral hygiene  Diabetic Foot Exam: N/A  Community Resource Referral / Chronic Care Management: CRR required this visit?  No   CCM required this visit?  No     Plan:     I have personally reviewed and noted the following in the patient's chart:   Medical and social history Use of alcohol, tobacco or illicit drugs  Current medications and supplements including opioid prescriptions. Patient is not currently taking opioid prescriptions. Functional ability and status Nutritional status Physical activity Advanced directives List of other physicians Hospitalizations, surgeries, and ER visits in previous 12 months Vitals Screenings to include cognitive, depression, and falls Referrals and appointments  In addition, I have reviewed and discussed with patient certain preventive protocols, quality metrics, and best practice recommendations. A written personalized care plan for preventive services as well as general preventive health recommendations were provided to patient.     Suzen Shove, RMA   07/17/2024   After Visit Summary: (MyChart) Due to this being a telephonic visit, the after visit summary with patients personalized plan was offered to patient via MyChart   Nurse Notes: Thank you for your time.  Suzen Shove   CMA II

## 2024-08-13 ENCOUNTER — Encounter: Payer: Self-pay | Admitting: Nurse Practitioner

## 2024-08-21 ENCOUNTER — Other Ambulatory Visit: Payer: Self-pay | Admitting: Nurse Practitioner

## 2024-09-03 ENCOUNTER — Ambulatory Visit (INDEPENDENT_AMBULATORY_CARE_PROVIDER_SITE_OTHER): Payer: Self-pay | Admitting: Nurse Practitioner

## 2024-09-03 ENCOUNTER — Other Ambulatory Visit: Payer: Self-pay

## 2024-09-03 ENCOUNTER — Encounter: Payer: Self-pay | Admitting: Nurse Practitioner

## 2024-09-03 VITALS — BP 135/89 | HR 60 | Resp 16 | Ht 65.0 in | Wt 223.2 lb

## 2024-09-03 DIAGNOSIS — J4520 Mild intermittent asthma, uncomplicated: Secondary | ICD-10-CM | POA: Diagnosis not present

## 2024-09-03 DIAGNOSIS — F4310 Post-traumatic stress disorder, unspecified: Secondary | ICD-10-CM | POA: Diagnosis not present

## 2024-09-03 DIAGNOSIS — I10 Essential (primary) hypertension: Secondary | ICD-10-CM

## 2024-09-03 DIAGNOSIS — Z23 Encounter for immunization: Secondary | ICD-10-CM | POA: Diagnosis not present

## 2024-09-03 DIAGNOSIS — F3342 Major depressive disorder, recurrent, in full remission: Secondary | ICD-10-CM

## 2024-09-03 DIAGNOSIS — E66812 Obesity, class 2: Secondary | ICD-10-CM | POA: Diagnosis not present

## 2024-09-03 DIAGNOSIS — Z6837 Body mass index (BMI) 37.0-37.9, adult: Secondary | ICD-10-CM

## 2024-09-03 DIAGNOSIS — E559 Vitamin D deficiency, unspecified: Secondary | ICD-10-CM | POA: Diagnosis not present

## 2024-09-03 DIAGNOSIS — G4700 Insomnia, unspecified: Secondary | ICD-10-CM

## 2024-09-03 DIAGNOSIS — E119 Type 2 diabetes mellitus without complications: Secondary | ICD-10-CM | POA: Diagnosis not present

## 2024-09-03 DIAGNOSIS — E6609 Other obesity due to excess calories: Secondary | ICD-10-CM

## 2024-09-03 DIAGNOSIS — M170 Bilateral primary osteoarthritis of knee: Secondary | ICD-10-CM | POA: Diagnosis not present

## 2024-09-03 DIAGNOSIS — M25561 Pain in right knee: Secondary | ICD-10-CM | POA: Diagnosis not present

## 2024-09-03 DIAGNOSIS — M25562 Pain in left knee: Secondary | ICD-10-CM | POA: Diagnosis not present

## 2024-09-03 LAB — POCT GLYCOSYLATED HEMOGLOBIN (HGB A1C): HbA1c, POC (controlled diabetic range): 7.2 % — AB (ref 0.0–7.0)

## 2024-09-03 MED ORDER — ARNUITY ELLIPTA 100 MCG/ACT IN AEPB: 1.0000 | INHALATION_SPRAY | Freq: Every day | RESPIRATORY_TRACT | 2 refills | Status: AC

## 2024-09-03 MED ORDER — ROSUVASTATIN CALCIUM 5 MG PO TABS: 5.0000 mg | ORAL_TABLET | Freq: Every day | ORAL | 11 refills | Status: DC

## 2024-09-03 MED ORDER — ATENOLOL 25 MG PO TABS: 25.0000 mg | ORAL_TABLET | Freq: Every day | ORAL | 2 refills | Status: DC

## 2024-09-03 MED ORDER — CYCLOBENZAPRINE HCL 10 MG PO TABS: 10.0000 mg | ORAL_TABLET | Freq: Three times a day (TID) | ORAL | 0 refills | Status: AC | PRN

## 2024-09-03 MED ORDER — ARNUITY ELLIPTA 100 MCG/ACT IN AEPB
1.0000 | INHALATION_SPRAY | Freq: Every day | RESPIRATORY_TRACT | 2 refills | Status: AC
Start: 2024-09-03 — End: ?

## 2024-09-03 MED ORDER — CETIRIZINE HCL 10 MG PO TABS: ORAL_TABLET | ORAL | 3 refills | Status: DC

## 2024-09-03 MED ORDER — FLUTICASONE PROPIONATE 50 MCG/ACT NA SUSP: NASAL | 5 refills | Status: AC

## 2024-09-03 MED ORDER — HYDROXYZINE HCL 25 MG PO TABS: 25.0000 mg | ORAL_TABLET | Freq: Every evening | ORAL | 0 refills | Status: DC | PRN

## 2024-09-03 MED ORDER — ALBUTEROL SULFATE HFA 108 (90 BASE) MCG/ACT IN AERS: 1.0000 | INHALATION_SPRAY | RESPIRATORY_TRACT | 11 refills | Status: AC | PRN

## 2024-09-03 MED ORDER — TRIAMCINOLONE ACETONIDE 0.1 % EX CREA: 1.0000 | TOPICAL_CREAM | Freq: Two times a day (BID) | CUTANEOUS | 0 refills | Status: AC

## 2024-09-03 MED ORDER — WEGOVY 0.25 MG/0.5ML ~~LOC~~ SOAJ: 0.2500 mg | SUBCUTANEOUS | 2 refills | Status: DC

## 2024-09-03 NOTE — Patient Instructions (Signed)

## 2024-09-03 NOTE — Progress Notes (Signed)
 Subjective   Patient ID: Sheri Davis, female    DOB: 05-18-1961, 63 y.o.   MRN: [id number]  Chief Complaint  Patient presents with   Prediabetes   Weight Management Screening    Pt is wanting to speak with provider in regards to weight loss. Pt states     Referring provider: Oley Bascom RAMAN, NP  Lekita TISA WEISEL is a 63 y.o. female with Past Medical History: No date: Allergy 2004: Anxiety 2004: Asthma No date: Back pain Entire life: Depression     Comment:  Sporadic 2004: GERD (gastroesophageal reflux disease) 2006: HIV infection (HCC) 2006: Hyperlipidemia 2004: Hypertension No date: Osteoarthritis of both knees y-12: Tachycardia     Comment:  Pt was under stress at the time   HPI  Patient presents today physical.  Overall she is doing well but she does want help with losing weight.  She is interested in starting [place].  We will place order for this for patient today.  A1c was 7.1 in office today.  We will also place a referral for patient to medical weight management.  Patient does need refills on medications today.  She is getting flu shot in office today. Denies f/c/s, n/v/d, hemoptysis, PND, leg swelling Denies chest pain or edema      No Known Allergies  Immunization History  Administered Date(s) Administered   Hepatitis A, Adult 09/19/2023, 04/23/2024   Influenza Split 09/08/2011, 08/14/2012   Influenza Whole 08/28/2007, 09/09/2007, 09/14/2008, 09/24/2009, 09/12/2010   Influenza, Seasonal, Injecte, Preservative Fre 08/22/2023, 09/03/2024   Influenza,inj,Quad PF,6+ Mos 08/26/2014, 08/18/2015, 09/13/2016, 07/18/2017, 08/15/2018, 08/21/2019, 09/13/2020, 08/19/2021, 08/30/2022   Influenza-Unspecified 08/25/2013   PFIZER(Purple Top)SARS-COV-2 Vaccination 12/03/2019, 12/24/2019, 09/13/2020   PPD Test 01/29/2015, 02/04/2016, 07/01/2016, 11/16/2017, 12/01/2017   Pneumococcal Conjugate-13 04/29/2015   Pneumococcal Polysaccharide-23 08/28/2007,  01/29/2012, 01/13/2021   Tdap 08/26/2014    Tobacco History: Social History   Tobacco Use  Smoking Status Never   Passive exposure: Never  Smokeless Tobacco Never   Counseling given: Not Answered   Outpatient Encounter Medications as of 09/03/2024  Medication Sig   semaglutide-weight management (WEGOVY) 0.25 MG/0.5ML SOAJ SQ injection Inject 0.25 mg into the skin once a week.   albuterol  (VENTOLIN  HFA) 108 (90 Base) MCG/ACT inhaler Inhale 1 puff into the lungs every 4 (four) hours as needed for shortness of breath.   atenolol  (TENORMIN ) 25 MG tablet Take 1 tablet (25 mg total) by mouth daily.   BIKTARVY 50-200-25 MG TABS tablet TAKE 1 TABLET BY MOUTH DAILY   cetirizine  (ZYRTEC ) 10 MG tablet TAKE 1 TABLET(10 MG) BY MOUTH AT BEDTIME   cyanocobalamin  (VITAMIN B12) 1000 MCG tablet TAKE 1 TABLET(1,000 MCG) BY MOUTH DAILY   cyclobenzaprine  (FLEXERIL ) 10 MG tablet Take 1 tablet (10 mg total) by mouth 3 (three) times daily as needed for muscle spasms.   Flaxseed, Linseed, (FLAX SEED OIL) 1000 MG CAPS Take by mouth.   fluticasone  (FLONASE ) 50 MCG/ACT nasal spray SHAKE LIQUID AND USE 1 SPRAY IN EACH NOSTRIL EVERY DAY   Fluticasone  Furoate (ARNUITY ELLIPTA ) 100 MCG/ACT AEPB Inhale 1 puff into the lungs daily.   Fluticasone  Furoate (ARNUITY ELLIPTA ) 100 MCG/ACT AEPB Inhale 1 Inhalation into the lungs daily.   hydrOXYzine  (ATARAX ) 25 MG tablet Take 1 tablet (25 mg total) by mouth at bedtime as needed.   LORazepam  (ATIVAN ) 0.5 MG tablet TAKE 1 TABLET(0.5 MG) BY MOUTH TWICE DAILY AS NEEDED FOR ANXIETY   rosuvastatin  (CRESTOR ) 5 MG tablet Take 1 tablet (5 mg  total) by mouth daily.   SF 5000 PLUS 1.1 % CREA dental cream Take by mouth at bedtime.   triamcinolone  cream (KENALOG ) 0.1 % Apply 1 Application topically 2 (two) times daily.   Vitamin D , Ergocalciferol , (DRISDOL ) 1.25 MG (50000 UNIT) CAPS capsule Take 1 capsule (50,000 Units total) by mouth every 7 (seven) days. (Patient not taking: Reported  on 09/03/2024)   [DISCONTINUED] albuterol  (VENTOLIN  HFA) 108 (90 Base) MCG/ACT inhaler Inhale 1 puff into the lungs every 4 (four) hours as needed for shortness of breath.   [DISCONTINUED] ARNUITY ELLIPTA  100 MCG/ACT AEPB INHALE 1 PUFF INTO THE LUNGS DAILY   [DISCONTINUED] atenolol  (TENORMIN ) 25 MG tablet TAKE 1 TABLET(25 MG) BY MOUTH DAILY   [DISCONTINUED] cetirizine  (ZYRTEC ) 10 MG tablet TAKE 1 TABLET(10 MG) BY MOUTH AT BEDTIME   [DISCONTINUED] cyclobenzaprine  (FLEXERIL ) 10 MG tablet TAKE 1 TABLET(10 MG) BY MOUTH THREE TIMES DAILY AS NEEDED FOR MUSCLE SPASMS   [DISCONTINUED] fluticasone  (FLONASE ) 50 MCG/ACT nasal spray SHAKE LIQUID AND USE 1 SPRAY IN EACH NOSTRIL EVERY DAY   [DISCONTINUED] Fluticasone  Furoate (ARNUITY ELLIPTA ) 100 MCG/ACT AEPB Inhale 1 Inhalation into the lungs daily.   [DISCONTINUED] hydrOXYzine  (ATARAX ) 25 MG tablet Take 1 tablet (25 mg total) by mouth at bedtime as needed.   [DISCONTINUED] rosuvastatin  (CRESTOR ) 5 MG tablet TAKE 1 TABLET(5 MG) BY MOUTH DAILY   [DISCONTINUED] triamcinolone  cream (KENALOG ) 0.1 % Apply 1 Application topically 2 (two) times daily.   No facility-administered encounter medications on file as of 09/03/2024.    Review of Systems  Review of Systems  Constitutional: Negative.   HENT: Negative.    Cardiovascular: Negative.   Gastrointestinal: Negative.   Allergic/Immunologic: Negative.   Neurological: Negative.   Psychiatric/Behavioral: Negative.       Objective:   BP 135/89   Pulse 60   Resp 16   Ht 5' 5 (1.651 m)   Wt 223 lb 3.2 oz (101.2 kg)   SpO2 96%   BMI 37.14 kg/m   Wt Readings from Last 5 Encounters:  09/03/24 223 lb 3.2 oz (101.2 kg)  07/17/24 213 lb (96.6 kg)  04/23/24 213 lb (96.6 kg)  02/06/24 213 lb (96.6 kg)  01/30/24 213 lb (96.6 kg)     Physical Exam Vitals and nursing note reviewed.  Constitutional:      General: She is not in acute distress.    Appearance: She is well-developed.  Cardiovascular:      Rate and Rhythm: Normal rate and regular rhythm.  Pulmonary:     Effort: Pulmonary effort is normal.     Breath sounds: Normal breath sounds.  Neurological:     Mental Status: She is alert and oriented to person, place, and time.       Assessment & Plan:   Mild intermittent asthma without complication -     Albuterol  Sulfate HFA; Inhale 1 puff into the lungs every 4 (four) hours as needed for shortness of breath.  Dispense: 18 g; Refill: 11 -     Cetirizine  HCl; TAKE 1 TABLET(10 MG) BY MOUTH AT BEDTIME  Dispense: 30 tablet; Refill: 3 -     Fluticasone  Propionate; SHAKE LIQUID AND USE 1 SPRAY IN EACH NOSTRIL EVERY DAY  Dispense: 16 g; Refill: 5  Essential hypertension -     Atenolol ; Take 1 tablet (25 mg total) by mouth daily.  Dispense: 30 tablet; Refill: 2 -     CBC -     Comprehensive metabolic panel with GFR  PTSD (post-traumatic  stress disorder) -     hydrOXYzine  HCl; Take 1 tablet (25 mg total) by mouth at bedtime as needed.  Dispense: 60 tablet; Refill: 0  Recurrent major depressive disorder, in full remission -     hydrOXYzine  HCl; Take 1 tablet (25 mg total) by mouth at bedtime as needed.  Dispense: 60 tablet; Refill: 0  Insomnia, unspecified type -     hydrOXYzine  HCl; Take 1 tablet (25 mg total) by mouth at bedtime as needed.  Dispense: 60 tablet; Refill: 0  Need for influenza vaccination -     Flu vaccine trivalent PF, 6mos and older(Flulaval,Afluria,Fluarix,Fluzone)  Class 2 obesity due to excess calories without serious comorbidity with body mass index (BMI) of 37.0 to 37.9 in adult -     Tzhncb; Inject 0.25 mg into the skin once a week.  Dispense: 2 mL; Refill: 2 -     Amb Ref to Medical Weight Management  Vitamin D  deficiency -     VITAMIN D  25 Hydroxy (Vit-D Deficiency, Fractures)  Type 2 diabetes mellitus without complication, without long-term current use of insulin (HCC) -     POCT glycosylated hemoglobin (Hb A1C) -     Microalbumin / creatinine urine  ratio -     Wegovy; Inject 0.25 mg into the skin once a week.  Dispense: 2 mL; Refill: 2 -     CBC -     Comprehensive metabolic panel with GFR -     Amb Ref to Medical Weight Management  Other orders -     Arnuity Ellipta ; Inhale 1 puff into the lungs daily.  Dispense: 30 each; Refill: 2 -     Cyclobenzaprine  HCl; Take 1 tablet (10 mg total) by mouth 3 (three) times daily as needed for muscle spasms.  Dispense: 30 tablet; Refill: 0 -     Arnuity Ellipta ; Inhale 1 Inhalation into the lungs daily.  Dispense: 30 each; Refill: 2 -     Rosuvastatin  Calcium ; Take 1 tablet (5 mg total) by mouth daily.  Dispense: 30 tablet; Refill: 11 -     Triamcinolone  Acetonide; Apply 1 Application topically 2 (two) times daily.  Dispense: 30 g; Refill: 0     Return in about 3 months (around 12/04/2024).     Bascom GORMAN Borer, NP 09/03/2024

## 2024-09-04 ENCOUNTER — Telehealth: Payer: Self-pay

## 2024-09-04 ENCOUNTER — Ambulatory Visit: Payer: Self-pay | Admitting: Nurse Practitioner

## 2024-09-04 ENCOUNTER — Encounter (INDEPENDENT_AMBULATORY_CARE_PROVIDER_SITE_OTHER): Payer: Self-pay

## 2024-09-04 ENCOUNTER — Other Ambulatory Visit: Payer: Self-pay

## 2024-09-04 DIAGNOSIS — E559 Vitamin D deficiency, unspecified: Secondary | ICD-10-CM

## 2024-09-04 LAB — CBC
Hematocrit: 45.2 % (ref 34.0–46.6)
Hemoglobin: 14.9 g/dL (ref 11.1–15.9)
MCH: 32 pg (ref 26.6–33.0)
MCHC: 33 g/dL (ref 31.5–35.7)
MCV: 97 fL (ref 79–97)
Platelets: 286 x10E3/uL (ref 150–450)
RBC: 4.66 x10E6/uL (ref 3.77–5.28)
RDW: 12.2 % (ref 11.7–15.4)
WBC: 5.9 x10E3/uL (ref 3.4–10.8)

## 2024-09-04 LAB — MICROALBUMIN / CREATININE URINE RATIO
Creatinine, Urine: 97.3 mg/dL
Microalb/Creat Ratio: 5 mg/g{creat} (ref 0–29)
Microalbumin, Urine: 5.1 ug/mL

## 2024-09-04 LAB — COMPREHENSIVE METABOLIC PANEL WITH GFR
ALT: 20 IU/L (ref 0–32)
AST: 19 IU/L (ref 0–40)
Albumin: 4.4 g/dL (ref 3.9–4.9)
Alkaline Phosphatase: 75 IU/L (ref 49–135)
BUN/Creatinine Ratio: 18 (ref 12–28)
BUN: 14 mg/dL (ref 8–27)
Bilirubin Total: 0.2 mg/dL (ref 0.0–1.2)
CO2: 22 mmol/L (ref 20–29)
Calcium: 9.6 mg/dL (ref 8.7–10.3)
Chloride: 102 mmol/L (ref 96–106)
Creatinine, Ser: 0.8 mg/dL (ref 0.57–1.00)
Globulin, Total: 2.5 g/dL (ref 1.5–4.5)
Glucose: 128 mg/dL — ABNORMAL HIGH (ref 70–99)
Potassium: 4.9 mmol/L (ref 3.5–5.2)
Sodium: 138 mmol/L (ref 134–144)
Total Protein: 6.9 g/dL (ref 6.0–8.5)
eGFR: 83 mL/min/1.73 (ref >59)

## 2024-09-04 LAB — VITAMIN D 25 HYDROXY (VIT D DEFICIENCY, FRACTURES): Vit D, 25-Hydroxy: 21.4 ng/mL — ABNORMAL LOW (ref 30.0–100.0)

## 2024-09-04 MED ORDER — VITAMIN D (ERGOCALCIFEROL) 1.25 MG (50000 UNIT) PO CAPS: 50000.0000 [IU] | ORAL_CAPSULE | ORAL | 2 refills | Status: AC

## 2024-09-04 NOTE — Telephone Encounter (Signed)
 Pt is requesting a refill on Vit D

## 2024-09-04 NOTE — Telephone Encounter (Signed)
 Source  Sheri Davis, Sheri Davis (Patient)   Subject  Sheri Davis, Sheri Davis (Patient)   Topic  Clinical - Prescription Issue    Communication  Reason for CRM: Received call from patient, states she was seen in office today for an appointment with provider.        Per patient states medication, Georjean, for pre-diabetes, was denied by insurance, [employer].        However per patient states per insurance verified that they would cover either Ozempic or Mounjaro, prescribed at the lowest dose.        Patient states her preference in medication would be Ozempic, but she is willing to try what provider recommends from the two.        Patient can be reached if need to discuss further at [phone].        [id number] - [place], [place] - [address] AT [facility] [address] [facility] [facility] [address]    [address] [place] [address]    Phone: [phone] Fax: [phone]

## 2024-09-05 ENCOUNTER — Other Ambulatory Visit: Payer: Self-pay

## 2024-09-05 DIAGNOSIS — E1165 Type 2 diabetes mellitus with hyperglycemia: Secondary | ICD-10-CM

## 2024-09-05 MED ORDER — OZEMPIC (0.25 OR 0.5 MG/DOSE) 2 MG/3ML ~~LOC~~ SOPN: PEN_INJECTOR | SUBCUTANEOUS | 2 refills | Status: DC

## 2024-09-10 ENCOUNTER — Other Ambulatory Visit (HOSPITAL_COMMUNITY)
Admission: RE | Admit: 2024-09-10 | Discharge: 2024-09-10 | Disposition: A | Discharge status: Home / Self Care | Source: Ambulatory Visit | Attending: Infectious Diseases | Admitting: Infectious Diseases

## 2024-09-10 ENCOUNTER — Other Ambulatory Visit

## 2024-09-10 ENCOUNTER — Ambulatory Visit

## 2024-09-10 ENCOUNTER — Encounter: Payer: Self-pay | Admitting: Infectious Diseases

## 2024-09-10 ENCOUNTER — Ambulatory Visit (INDEPENDENT_AMBULATORY_CARE_PROVIDER_SITE_OTHER): Admitting: Infectious Diseases

## 2024-09-10 ENCOUNTER — Other Ambulatory Visit: Payer: Self-pay

## 2024-09-10 VITALS — HR 58 | Temp 98.0°F | Resp 16

## 2024-09-10 DIAGNOSIS — Z79899 Other long term (current) drug therapy: Secondary | ICD-10-CM

## 2024-09-10 DIAGNOSIS — Z113 Encounter for screening for infections with a predominantly sexual mode of transmission: Secondary | ICD-10-CM | POA: Insufficient documentation

## 2024-09-10 DIAGNOSIS — Z124 Encounter for screening for malignant neoplasm of cervix: Secondary | ICD-10-CM | POA: Insufficient documentation

## 2024-09-10 DIAGNOSIS — N907 Vulvar cyst: Secondary | ICD-10-CM | POA: Diagnosis not present

## 2024-09-10 DIAGNOSIS — B20 Human immunodeficiency virus [HIV] disease: Secondary | ICD-10-CM | POA: Diagnosis not present

## 2024-09-10 DIAGNOSIS — Z780 Asymptomatic menopausal state: Secondary | ICD-10-CM | POA: Insufficient documentation

## 2024-09-10 NOTE — Progress Notes (Signed)
 urine      SUBJECTIVE    Sheri Davis is a 63 y.o. female here for an annual pelvic exam and pap smear.    Discussed the use of AI scribe software for clinical note transcription with the patient, who gave verbal consent to proceed.  History of Present Illness   Sheri Davis is a 64 year old female who presents for cervical cancer screening and Pap smear.  She last underwent a Pap smear in September 2025, which showed normal cytology and was HPV negative. She has a history of normal Pap smear results dating back to 2011. She is not sexually active with any partners and has not for years.  She has a mammogram scheduled for November 5th.      Review of Systems: Current GYN complaints or concerns: none Patient denies any abdominal/pelvic pain, problems with bowel movements, urination, vaginal discharge or intercourse.   Past Medical History:  Diagnosis Date   Allergy    Anxiety 2004   Asthma 2004   Back pain    Depression Entire life   Sporadic   GERD (gastroesophageal reflux disease) 2004   HIV infection (HCC) 2006   Hyperlipidemia 2006   Hypertension 2004   Osteoarthritis of both knees    Tachycardia y-12   Pt was under stress at the time    Gynecologic History: No obstetric history on file.  No LMP recorded. Patient is postmenopausal. Contraception: post menopausal status Last Pap: 07-2019. Results were: normal Last Mammogram: Scheduled Nov 2025    Objective  Physical Exam - chaperone present  Constitutional: Well developed, well nourished, no acute distress. She is alert and oriented x3.  Pelvic: External genitalia is normal in appearance. The vagina is normal in appearance. The cervix is bulbous and easily visualized. No CMT, normal expected cervical mucus present. Bimanual exam reveals uterus that is felt to be normal size, shape, and contour. No adnexal masses or tenderness noted. Breasts: symmetrical in contour, shape and texture. No  palpable masses/nodules. No nipple discharge.  Psych: She has a normal mood and affect.      Assessment & Plan:   HIV infection -  Well controlled on current regimen of   Nodule on lower vulva/upper thigh -  Cyst vs lipoma -  Will refer to gyn to see if they can remove it given size may need more complex procedure and possible OR for it vs in office.     Cervical cancer screening  - Cervical cancer screening is current with normal cytology and HPV negative results from the last Pap smear in September 2025. Historical results since 2011 have been normal. We discussed that currently recommendations are to continue with lifelong screenings for HIV+ women. I feel like she is very low risk with a robustly recovered CD4 > 1000, all normal pap smear screenings and no risk for new HPV exposures at this time she would be a good candidate to defer after 65 - however I recommend she follow up with Dr. Dea on this so they can have a joint discussion about best approach as they are uncomfortable for her to go through.     Orders Placed This Encounter  Procedures   HIV 1 RNA quant-no reflex-bld   T-helper cells (CD4) count   Ambulatory referral to Obstetrics / Gynecology    Referral Priority:   Routine    Referral Type:   Consultation    Referral Reason:   Specialty Services Required  Requested Specialty:   Obstetrics and Gynecology    Number of Visits Requested:   1    No orders of the defined types were placed in this encounter.   No follow-ups on file.   Corean Fireman, MSN, NP-C [facility]  [email or web address] Pager: [phone] Office: [phone] [facility] Main Line: [phone] *Secure Chat Communication Welcome

## 2024-09-10 NOTE — Patient Instructions (Signed)
   For GYN referral:   [facility]  Address: [address], [place], [place] [address] Phone: [phone]

## 2024-09-11 LAB — URINE CYTOLOGY ANCILLARY ONLY
Chlamydia: NEGATIVE
Comment: NEGATIVE
Comment: NEGATIVE
Comment: NORMAL
Neisseria Gonorrhea: NEGATIVE
Trichomonas: NEGATIVE

## 2024-09-11 LAB — T-HELPER CELLS (CD4) COUNT (NOT AT ARMC)
CD4 % Helper T Cell: 45 % (ref 33–65)
CD4 T Cell Abs: 1586 /uL (ref 400–1790)

## 2024-09-12 LAB — HIV-1 RNA QUANT-NO REFLEX-BLD
HIV 1 RNA Quant: NOT DETECTED {copies}/mL
HIV-1 RNA Quant, Log: NOT DETECTED {Log_copies}/mL

## 2024-09-12 LAB — CYTOLOGY - PAP
Adequacy: ABSENT
Diagnosis: NEGATIVE

## 2024-09-15 ENCOUNTER — Ambulatory Visit: Payer: Self-pay | Admitting: Infectious Diseases

## 2024-09-24 ENCOUNTER — Ambulatory Visit: Payer: Self-pay | Admitting: Infectious Diseases

## 2024-10-01 ENCOUNTER — Encounter

## 2024-10-15 ENCOUNTER — Ambulatory Visit: Admitting: Infectious Diseases

## 2024-10-22 ENCOUNTER — Ambulatory Visit: Admitting: Infectious Diseases

## 2024-10-26 ENCOUNTER — Emergency Department
Admission: EM | Admit: 2024-10-26 | Discharge: 2024-10-26 | Disposition: A | Discharge status: Home / Self Care | Attending: Emergency Medicine | Admitting: Emergency Medicine

## 2024-10-26 ENCOUNTER — Other Ambulatory Visit: Payer: Self-pay

## 2024-10-26 DIAGNOSIS — Z21 Asymptomatic human immunodeficiency virus [HIV] infection status: Secondary | ICD-10-CM | POA: Insufficient documentation

## 2024-10-26 DIAGNOSIS — Z794 Long term (current) use of insulin: Secondary | ICD-10-CM | POA: Insufficient documentation

## 2024-10-26 DIAGNOSIS — R197 Diarrhea, unspecified: Secondary | ICD-10-CM | POA: Diagnosis not present

## 2024-10-26 DIAGNOSIS — R55 Syncope and collapse: Secondary | ICD-10-CM | POA: Insufficient documentation

## 2024-10-26 DIAGNOSIS — R112 Nausea with vomiting, unspecified: Secondary | ICD-10-CM | POA: Diagnosis present

## 2024-10-26 DIAGNOSIS — I10 Essential (primary) hypertension: Secondary | ICD-10-CM | POA: Diagnosis not present

## 2024-10-26 DIAGNOSIS — E860 Dehydration: Secondary | ICD-10-CM | POA: Diagnosis not present

## 2024-10-26 LAB — URINALYSIS, COMPLETE (UACMP) WITH MICROSCOPIC
Bacteria, UA: NONE SEEN
Bilirubin Urine: NEGATIVE
Glucose, UA: 50 mg/dL — AB
Hgb urine dipstick: NEGATIVE
Ketones, ur: 5 mg/dL — AB
Leukocytes,Ua: NEGATIVE
Nitrite: NEGATIVE
Protein, ur: 30 mg/dL — AB
RBC / HPF: 0 RBC/hpf (ref 0–5)
Specific Gravity, Urine: 1.025 (ref 1.005–1.030)
pH: 5 (ref 5.0–8.0)

## 2024-10-26 LAB — COMPREHENSIVE METABOLIC PANEL WITH GFR
ALT: 27 U/L (ref 0–44)
AST: 34 U/L (ref 15–41)
Albumin: 4.3 g/dL (ref 3.5–5.0)
Alkaline Phosphatase: 64 U/L (ref 38–126)
Anion gap: 16 — ABNORMAL HIGH (ref 5–15)
BUN: 22 mg/dL (ref 8–23)
CO2: 23 mmol/L (ref 22–32)
Calcium: 9.8 mg/dL (ref 8.9–10.3)
Chloride: 95 mmol/L — ABNORMAL LOW (ref 98–111)
Creatinine, Ser: 1.18 mg/dL — ABNORMAL HIGH (ref 0.44–1.00)
GFR, Estimated: 52 mL/min — ABNORMAL LOW (ref >60)
Glucose, Bld: 165 mg/dL — ABNORMAL HIGH (ref 70–99)
Potassium: 3.5 mmol/L (ref 3.5–5.1)
Sodium: 134 mmol/L — ABNORMAL LOW (ref 135–145)
Total Bilirubin: 0.5 mg/dL (ref 0.0–1.2)
Total Protein: 7.7 g/dL (ref 6.5–8.1)

## 2024-10-26 LAB — CBC
HCT: 44.7 % (ref 36.0–46.0)
Hemoglobin: 14.9 g/dL (ref 12.0–15.0)
MCH: 31.5 pg (ref 26.0–34.0)
MCHC: 33.3 g/dL (ref 30.0–36.0)
MCV: 94.5 fL (ref 80.0–100.0)
Platelets: 319 K/uL (ref 150–400)
RBC: 4.73 MIL/uL (ref 3.87–5.11)
RDW: 11.9 % (ref 11.5–15.5)
WBC: 10.7 K/uL — ABNORMAL HIGH (ref 4.0–10.5)
nRBC: 0 % (ref 0.0–0.2)

## 2024-10-26 LAB — LIPASE, BLOOD: Lipase: 24 U/L (ref 11–51)

## 2024-10-26 MED ORDER — METOCLOPRAMIDE HCL 5 MG/ML IJ SOLN
10.0000 mg | Freq: Once | INTRAMUSCULAR | Status: AC
  Administered 2024-10-26: 10 mg via INTRAVENOUS
  Filled 2024-10-26: qty 2

## 2024-10-26 MED ORDER — ONDANSETRON HCL 4 MG/2ML IJ SOLN: 4.0000 mg | Freq: Once | INTRAMUSCULAR | Status: DC

## 2024-10-26 MED ORDER — SODIUM CHLORIDE 0.9 % IV BOLUS
1000.0000 mL | Freq: Once | INTRAVENOUS | Status: AC
  Administered 2024-10-26: 1000 mL via INTRAVENOUS

## 2024-10-26 MED ORDER — METOCLOPRAMIDE HCL 10 MG PO TABS: 10.0000 mg | ORAL_TABLET | Freq: Three times a day (TID) | ORAL | 0 refills | Status: AC | PRN

## 2024-10-26 NOTE — Discharge Instructions (Signed)
 Please take your nausea medication as needed, as written.  Please drink plenty of fluids.  Please follow-up with your doctor in the next couple days for recheck/reevaluation.  Return to the emergency department for any return of/worsening abdominal pain if you are unable to tolerate fluids due to vomiting or any other symptom per se concerning to yourself.

## 2024-10-26 NOTE — ED Triage Notes (Signed)
 Pt comes with belly pain ,nausea and vomiting. Pt states she recently went on cruise and has been on lots of pressure.   Pt just got back on the 16th. Pt states hot and cold flashes. Pt states mid lower belly pain.   Pt states she just started on ozempic  and recently had it increased.

## 2024-10-26 NOTE — ED Notes (Signed)
 Pt given DC instructions. Pt verbalized understanding of medication and follow up care. Pt taken from ED in wheelchair by this RN.

## 2024-10-26 NOTE — ED Notes (Signed)
 Attempted IV access. This RN was unsuccessful. Was able to get blood work. IV team consult placed at this time.

## 2024-10-26 NOTE — ED Notes (Signed)
IV team to bedside at this time 

## 2024-10-26 NOTE — ED Provider Notes (Signed)
 [facility] Provider Note    Event Date/Time   First MD Initiated Contact with Patient 10/26/24 0740     (approximate)  History   Chief Complaint: Nausea vomiting  HPI  Sheri Davis is a 63 y.o. female with a past medical history of anxiety, gastric reflux, hyperlipidemia, hypertension, HIV, presents to the emergency department for nausea and vomiting.  According to the patient she got back from a cruise approximately 2 weeks ago had been doing well until 4 days ago when she developed nausea and vomiting.  Patient states she is on Ozempic  and recently increased her dose about 1 week ago or so from 0.25-0.5.  Patient states she has been having occasional loose stool.  Patient has not been able to tolerate fluids and last night had a brief syncopal versus near syncopal episode.  States she believes she did hit her head she has a slight scrape to her forehead but patient is not on anticoagulation no headache weakness or numbness.  Physical Exam   Triage Vital Signs: ED Triage Vitals  Encounter Vitals Group     BP 10/26/24 0737 (!) 155/96     Girls Systolic BP Percentile --      Girls Diastolic BP Percentile --      Boys Systolic BP Percentile --      Boys Diastolic BP Percentile --      Pulse Rate 10/26/24 0737 64     Resp 10/26/24 0737 18     Temp 10/26/24 0737 98 F (36.7 C)     Temp src --      SpO2 10/26/24 0737 100 %     Weight 10/26/24 0736 204 lb (92.5 kg)     Height 10/26/24 0736 5' 6 (1.676 m)     Head Circumference --      Peak Flow --      Pain Score 10/26/24 0735 6     Pain Loc --      Pain Education --      Exclude from Growth Chart --     Most recent vital signs: Vitals:   10/26/24 0737  BP: (!) 155/96  Pulse: 64  Resp: 18  Temp: 98 F (36.7 C)  SpO2: 100%    General: Awake, no distress.  CV:  Good peripheral perfusion.  Regular rate and rhythm  Resp:  Normal effort.  Equal breath sounds bilaterally.  Abd:  No  distention.  Soft, nontender.  No rebound or guarding.  ED Results / Procedures / Treatments   EKG  EKG viewed and interpreted by myself shows sinus rhythm at 59 bpm with a narrow QRS, normal axis, normal intervals, lateral T wave inversions unchanged from prior EKG in 2016.  MEDICATIONS ORDERED IN ED: Medications  sodium chloride  0.9 % bolus 1,000 mL (has no administration in time range)  metoCLOPramide (REGLAN) injection 10 mg (has no administration in time range)     IMPRESSION / MDM / ASSESSMENT AND PLAN / ED COURSE  I reviewed the triage vital signs and the nursing notes.  Patient's presentation is most consistent with acute presentation with potential threat to life or bodily function.  Patient presents emergency department for nausea vomiting and a syncopal versus near syncopal episode yesterday.  Patient recently increased her Ozempic  from 0.25-0.5 which could very likely be the cause of her symptoms.  Differential would also include gastroenteritis or other intra-abdominal pathology such as SBO, partial SBO, ileus.  We will IV hydrate we  will treat with Reglan we will check labs including a CBC chemistry lipase and urine.  We will continue to closely monitor while awaiting further results.  Patient's lab work is overall reassuring.  CBC shows no concerning findings, chemistry shows mild dehydration with anion gap of 16.  LFTs and lipase are normal.  Urinalysis does not show any concerning findings there is 5 ketones after IV hydration.  Given the patient's anion gap elevated we have dosed a liter of fluids as well as Reglan.  Patient states she is now feeling much better.  I believe we could discharge patient with Reglan oral fluids have the patient follow-up with her doctor.  I discussed with the patient should her abdominal pain return or if she is unable to tolerate fluids she should return to the emergency department.  Patient is agreeable to this plan.  FINAL CLINICAL  IMPRESSION(S) / ED DIAGNOSES   Nausea vomiting Near syncope   Note:  This document was prepared using Dragon voice recognition software and may include unintentional dictation errors.   Dorothyann Drivers, MD 10/26/24 1355

## 2024-10-26 NOTE — ED Notes (Signed)
 Pt called out wanting to know why she has not gotten an IV. This RN made another attempt per pt request and still unsuccessful. Per Unit secretary the [facility] supervisor was made aware that IV had not arrived at this time and was told that contact has been made and IV team should arrive to pt's room shortly. Pt made aware.

## 2024-11-05 ENCOUNTER — Ambulatory Visit
Admission: RE | Admit: 2024-11-05 | Discharge: 2024-11-05 | Disposition: A | Discharge status: Home / Self Care | Source: Ambulatory Visit | Attending: Nurse Practitioner | Admitting: Nurse Practitioner

## 2024-11-05 DIAGNOSIS — Z1231 Encounter for screening mammogram for malignant neoplasm of breast: Secondary | ICD-10-CM | POA: Diagnosis present

## 2024-11-12 ENCOUNTER — Ambulatory Visit: Admitting: Infectious Diseases

## 2024-11-20 ENCOUNTER — Other Ambulatory Visit: Payer: Self-pay | Admitting: Nurse Practitioner

## 2024-12-01 ENCOUNTER — Other Ambulatory Visit: Payer: Self-pay

## 2024-12-01 DIAGNOSIS — Z113 Encounter for screening for infections with a predominantly sexual mode of transmission: Secondary | ICD-10-CM

## 2024-12-01 DIAGNOSIS — B20 Human immunodeficiency virus [HIV] disease: Secondary | ICD-10-CM

## 2024-12-03 ENCOUNTER — Encounter: Payer: Self-pay | Admitting: Nurse Practitioner

## 2024-12-03 ENCOUNTER — Other Ambulatory Visit

## 2024-12-03 ENCOUNTER — Ambulatory Visit (INDEPENDENT_AMBULATORY_CARE_PROVIDER_SITE_OTHER): Payer: Self-pay | Admitting: Nurse Practitioner

## 2024-12-03 ENCOUNTER — Other Ambulatory Visit: Payer: Self-pay

## 2024-12-03 VITALS — BP 128/85 | HR 71 | Temp 96.8°F | Wt 205.0 lb

## 2024-12-03 DIAGNOSIS — E538 Deficiency of other specified B group vitamins: Secondary | ICD-10-CM | POA: Diagnosis not present

## 2024-12-03 DIAGNOSIS — E1165 Type 2 diabetes mellitus with hyperglycemia: Secondary | ICD-10-CM

## 2024-12-03 DIAGNOSIS — E559 Vitamin D deficiency, unspecified: Secondary | ICD-10-CM

## 2024-12-03 DIAGNOSIS — J4520 Mild intermittent asthma, uncomplicated: Secondary | ICD-10-CM

## 2024-12-03 DIAGNOSIS — Z113 Encounter for screening for infections with a predominantly sexual mode of transmission: Secondary | ICD-10-CM

## 2024-12-03 DIAGNOSIS — B20 Human immunodeficiency virus [HIV] disease: Secondary | ICD-10-CM

## 2024-12-03 MED ORDER — CETIRIZINE HCL 10 MG PO TABS: ORAL_TABLET | ORAL | 3 refills | Status: AC

## 2024-12-03 MED ORDER — ONDANSETRON 4 MG PO TBDP: 4.0000 mg | ORAL_TABLET | Freq: Three times a day (TID) | ORAL | 0 refills | Status: AC | PRN

## 2024-12-03 MED ORDER — OZEMPIC (0.25 OR 0.5 MG/DOSE) 2 MG/3ML ~~LOC~~ SOPN: PEN_INJECTOR | SUBCUTANEOUS | 2 refills | Status: AC

## 2024-12-03 MED ORDER — VITAMIN B-12 1000 MCG PO TABS: 1000.0000 ug | ORAL_TABLET | Freq: Every day | ORAL | 2 refills | Status: AC

## 2024-12-03 NOTE — Progress Notes (Signed)
 "  Subjective   Patient ID: Sheri Davis, female    DOB: January 29, 1961, 64 y.o.   MRN: [id number]  Chief Complaint  Patient presents with   Prediabetes    3 month f/u.     Referring provider: Oley Bascom RAMAN, NP  Sheri Davis is a 64 y.o. female with Past Medical History: No date: Allergy 2004: Anxiety 2004: Asthma No date: Back pain Entire life: Depression     Comment:  Sporadic 2004: GERD (gastroesophageal reflux disease) 2006: HIV infection (HCC) 2006: Hyperlipidemia 2004: Hypertension No date: Osteoarthritis of both knees y-12: Tachycardia     Comment:  Pt was under stress at the time   HPI  Patient presents today physical.  Overall she is doing well.  She has started ozempic . We will place order for this for patient today.  A1c was 7.1 at last visit.  Has lost 16 pounds.Denies f/c/s, n/v/d, hemoptysis, PND, leg swelling. Denies chest pain or edema.    Allergies[1]  Immunization History  Administered Date(s) Administered   Hepatitis A, Adult 09/19/2023, 04/23/2024   Influenza Split 09/08/2011, 08/14/2012   Influenza Whole 08/28/2007, 09/09/2007, 09/14/2008, 09/24/2009, 09/12/2010   Influenza, Seasonal, Injecte, Preservative Fre 08/22/2023, 09/03/2024   Influenza,inj,Quad PF,6+ Mos 08/26/2014, 08/18/2015, 09/13/2016, 07/18/2017, 08/15/2018, 08/21/2019, 09/13/2020, 08/19/2021, 08/30/2022   Influenza-Unspecified 08/25/2013   PFIZER(Purple Top)SARS-COV-2 Vaccination 12/03/2019, 12/24/2019, 09/13/2020   PPD Test 01/29/2015, 02/04/2016, 07/01/2016, 11/16/2017, 12/01/2017   Pneumococcal Conjugate-13 04/29/2015   Pneumococcal Polysaccharide-23 08/28/2007, 01/29/2012, 01/13/2021   Tdap 08/26/2014    Tobacco History: Tobacco Use History[2] Counseling given: Not Answered   Outpatient Encounter Medications as of 12/03/2024  Medication Sig   albuterol  (VENTOLIN  HFA) 108 (90 Base) MCG/ACT inhaler Inhale 1 puff into the lungs every 4 (four) hours as  needed for shortness of breath.   atenolol  (TENORMIN ) 25 MG tablet Take 1 tablet (25 mg total) by mouth daily.   BIKTARVY 50-200-25 MG TABS tablet TAKE 1 TABLET BY MOUTH DAILY   celecoxib (CELEBREX) 200 MG capsule Take 200 mg by mouth daily as needed.   cyclobenzaprine  (FLEXERIL ) 10 MG tablet Take 1 tablet (10 mg total) by mouth 3 (three) times daily as needed for muscle spasms.   Flaxseed, Linseed, (FLAX SEED OIL) 1000 MG CAPS Take by mouth.   fluticasone  (FLONASE ) 50 MCG/ACT nasal spray SHAKE LIQUID AND USE 1 SPRAY IN EACH NOSTRIL EVERY DAY   Fluticasone  Furoate (ARNUITY ELLIPTA ) 100 MCG/ACT AEPB Inhale 1 puff into the lungs daily.   Fluticasone  Furoate (ARNUITY ELLIPTA ) 100 MCG/ACT AEPB Inhale 1 Inhalation into the lungs daily.   hydrOXYzine  (ATARAX ) 25 MG tablet Take 1 tablet (25 mg total) by mouth at bedtime as needed.   LORazepam  (ATIVAN ) 0.5 MG tablet TAKE 1 TABLET(0.5 MG) BY MOUTH TWICE DAILY AS NEEDED FOR ANXIETY   ondansetron  (ZOFRAN -ODT) 4 MG disintegrating tablet Take 1 tablet (4 mg total) by mouth every 8 (eight) hours as needed.   rosuvastatin  (CRESTOR ) 5 MG tablet TAKE 1 TABLET(5 MG) BY MOUTH DAILY   SF 5000 PLUS 1.1 % CREA dental cream Take by mouth at bedtime.   triamcinolone  cream (KENALOG ) 0.1 % Apply 1 Application topically 2 (two) times daily.   Vitamin D , Ergocalciferol , (DRISDOL ) 1.25 MG (50000 UNIT) CAPS capsule Take 1 capsule (50,000 Units total) by mouth every 7 (seven) days.   [DISCONTINUED] cetirizine  (ZYRTEC ) 10 MG tablet TAKE 1 TABLET(10 MG) BY MOUTH AT BEDTIME   [DISCONTINUED] cyanocobalamin  (VITAMIN B12) 1000 MCG tablet TAKE 1 TABLET(1,000 MCG) BY  MOUTH DAILY   [DISCONTINUED] Semaglutide ,0.25 or 0.5MG /DOS, (OZEMPIC , 0.25 OR 0.5 MG/DOSE,) 2 MG/3ML SOPN Inject 0.25 mg into the skin once weekly for 4 weeks, then increase to 0.5 mg weekly thereafter   cetirizine  (ZYRTEC ) 10 MG tablet TAKE 1 TABLET(10 MG) BY MOUTH AT BEDTIME   cyanocobalamin  (VITAMIN B12) 1000 MCG  tablet Take 1 tablet (1,000 mcg total) by mouth daily.   metoCLOPramide  (REGLAN ) 10 MG tablet Take 1 tablet (10 mg total) by mouth every 8 (eight) hours as needed for nausea. (Patient not taking: Reported on 12/03/2024)   Semaglutide ,0.25 or 0.5MG /DOS, (OZEMPIC , 0.25 OR 0.5 MG/DOSE,) 2 MG/3ML SOPN Inject 0.25 mg into the skin once weekly for 4 weeks, then increase to 0.5 mg weekly thereafter   No facility-administered encounter medications on file as of 12/03/2024.    Review of Systems  Review of Systems  Constitutional: Negative.   HENT: Negative.    Cardiovascular: Negative.   Gastrointestinal: Negative.   Allergic/Immunologic: Negative.   Neurological: Negative.   Psychiatric/Behavioral: Negative.       Objective:   BP 128/85 (BP Location: Left Arm, Patient Position: Sitting, Cuff Size: Normal)   Pulse 71   Temp (!) 96.8 F (36 C) (Temporal)   Wt 205 lb (93 kg)   SpO2 100%   BMI 33.09 kg/m   Wt Readings from Last 5 Encounters:  12/03/24 205 lb (93 kg)  10/26/24 204 lb (92.5 kg)  09/03/24 223 lb 3.2 oz (101.2 kg)  07/17/24 213 lb (96.6 kg)  04/23/24 213 lb (96.6 kg)     Physical Exam Vitals and nursing note reviewed.  Constitutional:      General: She is not in acute distress.    Appearance: She is well-developed.  Cardiovascular:     Rate and Rhythm: Normal rate and regular rhythm.  Pulmonary:     Effort: Pulmonary effort is normal.     Breath sounds: Normal breath sounds.  Neurological:     Mental Status: She is alert and oriented to person, place, and time.       Assessment & Plan:   Vitamin D  deficiency -     VITAMIN D  25 Hydroxy (Vit-D Deficiency, Fractures)  Mild intermittent asthma without complication -     Cetirizine  HCl; TAKE 1 TABLET(10 MG) BY MOUTH AT BEDTIME  Dispense: 30 tablet; Refill: 3  Type 2 diabetes mellitus with hyperglycemia, without long-term current use of insulin (HCC) -     Ozempic  (0.25 or 0.5 MG/DOSE); Inject 0.25 mg into the  skin once weekly for 4 weeks, then increase to 0.5 mg weekly thereafter  Dispense: 3 mL; Refill: 2 -     Hemoglobin A1c  Vitamin B12 deficiency -     Vitamin B12  Other orders -     Vitamin B-12; Take 1 tablet (1,000 mcg total) by mouth daily.  Dispense: 30 tablet; Refill: 2 -     Ondansetron ; Take 1 tablet (4 mg total) by mouth every 8 (eight) hours as needed.  Dispense: 20 tablet; Refill: 0     Return in about 3 months (around 03/03/2025).   Bascom GORMAN Borer, NP 12/03/2024     [1] No Known Allergies [2]  Social History Tobacco Use  Smoking Status Never   Passive exposure: Never  Smokeless Tobacco Never   "

## 2024-12-04 ENCOUNTER — Ambulatory Visit: Payer: Self-pay | Admitting: Nurse Practitioner

## 2024-12-04 LAB — COMPLETE METABOLIC PANEL WITHOUT GFR
AG Ratio: 1.6 (calc) (ref 1.0–2.5)
ALT: 16 U/L (ref 6–29)
AST: 19 U/L (ref 10–35)
Albumin: 4.1 g/dL (ref 3.6–5.1)
Alkaline phosphatase (APISO): 50 U/L (ref 37–153)
BUN: 13 mg/dL (ref 7–25)
CO2: 28 mmol/L (ref 20–32)
Calcium: 9.6 mg/dL (ref 8.6–10.4)
Chloride: 104 mmol/L (ref 98–110)
Creat: 0.77 mg/dL (ref 0.50–1.05)
Globulin: 2.6 g/dL (ref 1.9–3.7)
Glucose, Bld: 97 mg/dL (ref 65–99)
Potassium: 4.7 mmol/L (ref 3.5–5.3)
Sodium: 139 mmol/L (ref 135–146)
Total Bilirubin: 0.4 mg/dL (ref 0.2–1.2)
Total Protein: 6.7 g/dL (ref 6.1–8.1)

## 2024-12-04 LAB — TEST AUTHORIZATION: TEST NAME:: 19110

## 2024-12-04 LAB — HEMOGLOBIN A1C
Est. average glucose Bld gHb Est-mCnc: 134 mg/dL
Hgb A1c MFr Bld: 6.3 % — ABNORMAL HIGH (ref 4.8–5.6)

## 2024-12-04 LAB — VITAMIN D 25 HYDROXY (VIT D DEFICIENCY, FRACTURES): Vit D, 25-Hydroxy: 53.6 ng/mL (ref 30.0–100.0)

## 2024-12-04 LAB — SYPHILIS: RPR W/REFLEX TO RPR TITER AND TREPONEMAL ANTIBODIES, TRADITIONAL SCREENING AND DIAGNOSIS ALGORITHM: RPR Ser Ql: NONREACTIVE

## 2024-12-04 LAB — VITAMIN B12: Vitamin B-12: >2000 pg/mL — ABNORMAL HIGH (ref 232–1245)

## 2024-12-06 ENCOUNTER — Ambulatory Visit: Payer: Self-pay | Admitting: Infectious Diseases

## 2024-12-09 NOTE — Progress Notes (Signed)
 The 10-year ASCVD risk score (Arnett DK, et al., 2019) is: 16.2%   Values used to calculate the score:     Age: 64 years     Clinically relevant sex: Female     Is Non-Hispanic African American: Yes     Diabetic: Yes     Tobacco smoker: No     Systolic Blood Pressure: 128 mmHg     Is BP treated: Yes     HDL Cholesterol: 92 mg/dL     Total Cholesterol: 235 mg/dL  Currently prescribed rosuvastatin  5 mg.  Brisa Auth, BSN, RN

## 2024-12-17 ENCOUNTER — Encounter: Payer: Self-pay | Admitting: Infectious Diseases

## 2024-12-17 ENCOUNTER — Other Ambulatory Visit: Payer: Self-pay

## 2024-12-17 ENCOUNTER — Other Ambulatory Visit (HOSPITAL_COMMUNITY)
Admission: RE | Admit: 2024-12-17 | Discharge: 2024-12-17 | Disposition: A | Discharge status: Home / Self Care | Source: Ambulatory Visit | Attending: Infectious Diseases | Admitting: Infectious Diseases

## 2024-12-17 ENCOUNTER — Ambulatory Visit: Payer: Self-pay | Admitting: Infectious Diseases

## 2024-12-17 ENCOUNTER — Other Ambulatory Visit: Payer: Self-pay | Admitting: Nurse Practitioner

## 2024-12-17 VITALS — BP 114/81 | HR 70 | Temp 97.6°F | Ht 66.0 in | Wt 203.0 lb

## 2024-12-17 DIAGNOSIS — Z0000 Encounter for general adult medical examination without abnormal findings: Secondary | ICD-10-CM

## 2024-12-17 DIAGNOSIS — Z113 Encounter for screening for infections with a predominantly sexual mode of transmission: Secondary | ICD-10-CM | POA: Diagnosis present

## 2024-12-17 DIAGNOSIS — B20 Human immunodeficiency virus [HIV] disease: Secondary | ICD-10-CM

## 2024-12-17 DIAGNOSIS — Z23 Encounter for immunization: Secondary | ICD-10-CM | POA: Diagnosis not present

## 2024-12-17 DIAGNOSIS — Z79899 Other long term (current) drug therapy: Secondary | ICD-10-CM | POA: Diagnosis not present

## 2024-12-17 DIAGNOSIS — Z7185 Encounter for immunization safety counseling: Secondary | ICD-10-CM

## 2024-12-17 DIAGNOSIS — I10 Essential (primary) hypertension: Secondary | ICD-10-CM

## 2024-12-17 MED ORDER — BIKTARVY 50-200-25 MG PO TABS: 1.0000 | ORAL_TABLET | Freq: Every day | ORAL | 3 refills | Status: AC

## 2024-12-17 NOTE — Progress Notes (Addendum)
 "                                                                                                                                                                                                      [address], [place], [place], [address]                                                                  Phn. [phone]; Fax: [phone]                                                                             Date: 12/17/24  Reason for Visit: HIV fu   HPI: Sheri Davis is a 64 y.o.old female with a history of HIV acquired ? 2007 via heterosexual encounter, seems to have been on Atripla since diagnosis, switched to Biktarvy  on 02/06/24 as a better alternative, following my partner Dr Eben since 2008, Allergy/Asthma, GERD, Hyperlipidemia, Anxiety/Depression who is here for regular fu.  1/21 Reports late November/early December she had norovirus and she had a fall hitting her head on a glass door due to extreme weakness, passing out.  Was seen at ED 11/30, unremarkable workup.  Has some soreness in her scalp although improving.  Compliant with Biktarvy  with no missed doses or concern.  She has been started on Ozempic  by her PCP for 3 months and has lost 18 pounds weight.  She is willing to get Tdap vaccine.  She is closely following with her PCP.  No other concerns.   ROS: As stated in above HPI; all other systems were reviewed and are otherwise negative unless noted below  No reported fever / chills, night sweats, unintentional weight loss, acute visual change, odynophagia, chest pain/pressure, new or worsened SOB or WOB, nausea, vomiting, diarrhea, dysuria, GU discharge, syncope, seizures, red/hot swollen joints, hallucinations / delusions, rashes, new allergies, unusual / excessive bleeding, swollen lymph nodes, or new hospitalizations since the pt was last seen.  PMH/ PSH/ FamHx / Social Hx , medications and allergies reviewed and updated as appropriate;  please see corresponding  tab in EHR / prior notes                                        Current Outpatient Medications on File Prior to Visit  Medication Sig Dispense Refill   albuterol  (VENTOLIN  HFA) 108 (90 Base) MCG/ACT inhaler Inhale 1 puff into the lungs every 4 (four) hours as needed for shortness of breath. 18 g 11   atenolol  (TENORMIN ) 25 MG tablet Take 1 tablet (25 mg total) by mouth daily. 30 tablet 2   BIKTARVY  50-200-25 MG TABS tablet TAKE 1 TABLET BY MOUTH DAILY 30 tablet 5   celecoxib (CELEBREX) 200 MG capsule Take 200 mg by mouth daily as needed.     cetirizine  (ZYRTEC ) 10 MG tablet TAKE 1 TABLET(10 MG) BY MOUTH AT BEDTIME 30 tablet 3   cyanocobalamin  (VITAMIN B12) 1000 MCG tablet Take 1 tablet (1,000 mcg total) by mouth daily. 30 tablet 2   cyclobenzaprine  (FLEXERIL ) 10 MG tablet Take 1 tablet (10 mg total) by mouth 3 (three) times daily as needed for muscle spasms. 30 tablet 0   Flaxseed, Linseed, (FLAX SEED OIL) 1000 MG CAPS Take by mouth.     fluticasone  (FLONASE ) 50 MCG/ACT nasal spray SHAKE LIQUID AND USE 1 SPRAY IN EACH NOSTRIL EVERY DAY 16 g 5   Fluticasone  Furoate (ARNUITY ELLIPTA ) 100 MCG/ACT AEPB Inhale 1 puff into the lungs daily. 30 each 2   Fluticasone  Furoate (ARNUITY ELLIPTA ) 100 MCG/ACT AEPB Inhale 1 Inhalation into the lungs daily. 30 each 2   hydrOXYzine  (ATARAX ) 25 MG tablet Take 1 tablet (25 mg total) by mouth at bedtime as needed. 60 tablet 0   LORazepam  (ATIVAN ) 0.5 MG tablet TAKE 1 TABLET(0.5 MG) BY MOUTH TWICE DAILY AS NEEDED FOR ANXIETY 30 tablet 0   ondansetron  (ZOFRAN -ODT) 4 MG disintegrating tablet Take 1 tablet (4 mg total) by mouth every 8 (eight) hours as needed. 20 tablet 0   rosuvastatin  (CRESTOR ) 5 MG tablet TAKE 1 TABLET(5 MG) BY MOUTH DAILY 90 tablet 1   Semaglutide ,0.25 or 0.5MG /DOS, (OZEMPIC , 0.25 OR 0.5 MG/DOSE,) 2 MG/3ML SOPN Inject 0.25 mg into the skin once weekly for 4 weeks, then increase to 0.5 mg weekly thereafter 3 mL 2   SF 5000 PLUS 1.1 % CREA dental cream  Take by mouth at bedtime.     triamcinolone  cream (KENALOG ) 0.1 % Apply 1 Application topically 2 (two) times daily. 30 g 0   Vitamin D , Ergocalciferol , (DRISDOL ) 1.25 MG (50000 UNIT) CAPS capsule Take 1 capsule (50,000 Units total) by mouth every 7 (seven) days. 5 capsule 2   metoCLOPramide  (REGLAN ) 10 MG tablet Take 1 tablet (10 mg total) by mouth every 8 (eight) hours as needed for nausea. (Patient not taking: Reported on 12/17/2024) 30 tablet 0   No current facility-administered medications on file prior to visit.   No Known Allergies  Past Medical History:  Diagnosis Date   Allergy    Anxiety 2004   Asthma 2004   Back pain    Depression Entire life   Sporadic   GERD (gastroesophageal reflux disease) 2004   HIV infection (HCC) 2006   Hyperlipidemia 2006   Hypertension 2004   Osteoarthritis of both knees    Tachycardia y-12   Pt was under stress at the time   Past Surgical History:  Procedure Laterality Date   COLONOSCOPY WITH  PROPOFOL  N/A 04/22/2013   Procedure: COLONOSCOPY WITH PROPOFOL ;  Surgeon: Gladis MARLA Louder, MD;  Location: [facility];  Service: Endoscopy;  Laterality: N/A;   NO PAST SURGERIES     Social History   Socioeconomic History   Marital status: Single    Spouse name: Not on file   Number of children: Not on file   Years of education: Not on file   Highest education level: Bachelor's degree (e.g., BA, AB, BS)  Occupational History   Not on file  Tobacco Use   Smoking status: Never    Passive exposure: Never   Smokeless tobacco: Never  Vaping Use   Vaping status: Never Used  Substance and Sexual Activity   Alcohol use: Yes    Comment: occasional   Drug use: Not Currently    Types: Marijuana    Comment: cannabis several years ago   Sexual activity: Never    Partners: Male    Comment: declined condoms  Other Topics Concern   Not on file  Social History Narrative   Not on file   Social Drivers of Health   Tobacco Use: Low Risk (12/17/2024)    Patient History    Smoking Tobacco Use: Never    Smokeless Tobacco Use: Never    Passive Exposure: Never  Financial Resource Strain: Low Risk (08/27/2024)   Overall Financial Resource Strain (CARDIA)    Difficulty of Paying Living Expenses: Not very hard  Food Insecurity: No Food Insecurity (12/03/2024)   Epic    Worried About Programme Researcher, Broadcasting/film/video in the Last Year: Never true    Ran Out of Food in the Last Year: Never true  Transportation Needs: No Transportation Needs (12/03/2024)   Epic    Lack of Transportation (Medical): No    Lack of Transportation (Non-Medical): No  Physical Activity: Sufficiently Active (08/27/2024)   Exercise Vital Sign    Days of Exercise per Week: 4 days    Minutes of Exercise per Session: 60 min  Stress: Stress Concern Present (08/27/2024)   [employer] of Occupational Health - Occupational Stress Questionnaire    Feeling of Stress: To some extent  Social Connections: Moderately Integrated (08/27/2024)   Social Connection and Isolation Panel    Frequency of Communication with Friends and Family: Three times a week    Frequency of Social Gatherings with Friends and Family: Once a week    Attends Religious Services: More than 4 times per year    Active Member of Clubs or Organizations: Yes    Attends Banker Meetings: Never    Marital Status: Never married  Intimate Partner Violence: Not At Risk (12/03/2024)   Epic    Fear of Current or Ex-Partner: No    Emotionally Abused: No    Physically Abused: No    Sexually Abused: No  Depression (PHQ2-9): Low Risk (12/03/2024)   Depression (PHQ2-9)    PHQ-2 Score: 0  Alcohol Screen: Low Risk (08/27/2024)   Alcohol Screen    Last Alcohol Screening Score (AUDIT): 1  Housing: Low Risk (12/03/2024)   Epic    Unable to Pay for Housing in the Last Year: No    Number of Times Moved in the Last Year: 0    Homeless in the Last Year: No  Utilities: Not At Risk (12/03/2024)   Epic    Threatened with loss of  utilities: No  Health Literacy: Adequate Health Literacy (07/17/2024)   B1300 Health Literacy    Frequency of need for  help with medical instructions: Never   Family History  Problem Relation Age of Onset   Alcohol abuse Mother    Heart disease Mother    Anemia Mother    Hypokalemia Mother    Schizophrenia Mother    Depression Mother    Osteoarthritis Father    Diabetes Father    Hypertension Father    Goiter Father    Mental illness Father    Diabetes Brother    Breast cancer Neg Hx     Vitals  BP 114/81   Pulse 70   Temp 97.6 F (36.4 C) (Temporal)   Ht 5' 6 (1.676 m)   Wt 203 lb (92.1 kg)   SpO2 99%   BMI 32.77 kg/m   Examination  Gen: no acute distress HEENT: [place]/AT, no scleral icterus, no pale conjunctivae, hearing normal, oral mucosa moist  Neck: Supple Cardio: Normal HR Resp: Pulmonary effort normal in room air GI: nondistended GU: Musc: Extremities: No pedal edema Skin: No rashes, scalp with no concerns Neuro: grossly non focal, awake, alert, ambulatory, ambulatory   Psych: Calm, cooperative  Lab Results HIV 1 RNA Quant  Date Value  09/10/2024 NOT DETECTED copies/mL  04/23/2024 25 copies/mL (H)  01/30/2024 <20 Copies/mL (H)   CD4 T Cell Abs (/uL)  Date Value  09/10/2024 1,586  01/30/2024 1,499  08/22/2023 1,380   No results found for: HIV1GENOSEQ Lab Results  Component Value Date   WBC 10.7 (H) 10/26/2024   HGB 14.9 10/26/2024   HCT 44.7 10/26/2024   MCV 94.5 10/26/2024   PLT 319 10/26/2024    Lab Results  Component Value Date   CREATININE 0.77 12/03/2024   BUN 13 12/03/2024   NA 139 12/03/2024   K 4.7 12/03/2024   CL 104 12/03/2024   CO2 28 12/03/2024   Lab Results  Component Value Date   ALT 16 12/03/2024   AST 19 12/03/2024   ALKPHOS 64 10/26/2024   BILITOT 0.4 12/03/2024    Lab Results  Component Value Date   CHOL 235 (H) 01/30/2024   TRIG 108 01/30/2024   HDL 92 01/30/2024   LDLCALC 125 (H) 01/30/2024   Lab  Results  Component Value Date   HAV NEG 09/08/2011   Lab Results  Component Value Date   HEPBSAG NEG 03/07/2007   HEPBSAB POS (A) 03/07/2007   Lab Results  Component Value Date   HCVAB NEG 03/07/2007   Lab Results  Component Value Date   CHLAMYDIAWP Negative 09/10/2024   N Negative 09/10/2024   No results found for: GCPROBEAPT No results found for: QUANTGOLD  Health Maintenance: Immunization History  Administered Date(s) Administered   Hepatitis A, Adult 09/19/2023, 04/23/2024   Influenza Split 09/08/2011, 08/14/2012   Influenza Whole 08/28/2007, 09/09/2007, 09/14/2008, 09/24/2009, 09/12/2010   Influenza, Seasonal, Injecte, Preservative Fre 08/22/2023, 09/03/2024   Influenza,inj,Quad PF,6+ Mos 08/26/2014, 08/18/2015, 09/13/2016, 07/18/2017, 08/15/2018, 08/21/2019, 09/13/2020, 08/19/2021, 08/30/2022   Influenza-Unspecified 08/25/2013   PFIZER(Purple Top)SARS-COV-2 Vaccination 12/03/2019, 12/24/2019, 09/13/2020   PPD Test 01/29/2015, 02/04/2016, 07/01/2016, 11/16/2017, 12/01/2017   Pneumococcal Conjugate-13 04/29/2015   Pneumococcal Polysaccharide-23 08/28/2007, 01/29/2012, 01/13/2021   Tdap 08/26/2014    Assessment/Plan: # HIV, well controlled in a heterosexual female  - Adherence assessed, side effects reviewed/discussed and DDIs reviewed  - Continue Biktarvy , refill sent - Labs today. 1/7 CMP reviewed and unremarkable  - Fu in 5 months   # Hyperlipidemia/HTN/Asthma/Obesity/Prediabetes - on atenolol , on flax seed oil, rosuvastatin , Semaglutide  - Stable, Fu with PCP, last seen 12/03/24  #  Anxiety/Depression - no concerns, stable   # Immunization  - Tdap today   # Health maintenance - Lipid panel today  - Mammogram 11/10/24 negative, screening mammogram in a year - Colonoscopy in 2014 ( cannot see reports) fu with PCP for colon ca screening as indicated  - She was told she no longer needs Pap smear due to her age by her PCP - Follows with dental care at  [facility]  I personally spent a total of 30 minutes in the care of the patient today including preparing to see the patient, getting/reviewing separately obtained history, performing a medically appropriate exam/evaluation, counseling and educating, placing orders, documenting clinical information in the EHR, independently interpreting results, and communicating results.    Electronically signed by:  Annalee Orem, MD Infectious Disease Physician [facility] [address] [place], [place] [address] Phone: [phone]  Fax: [phone] "

## 2024-12-18 LAB — URINE CYTOLOGY ANCILLARY ONLY
Chlamydia: NEGATIVE
Comment: NEGATIVE
Comment: NORMAL
Neisseria Gonorrhea: NEGATIVE

## 2024-12-18 LAB — T-HELPER CELLS (CD4) COUNT (NOT AT ARMC)
CD4 % Helper T Cell: 47 % (ref 33–65)
CD4 T Cell Abs: 1381 /uL (ref 400–1790)

## 2024-12-18 NOTE — Telephone Encounter (Signed)
 atenolol  (TENORMIN ) 25 MG tablet [Pharmacy Med Name: ATENOLOL  25MG  TABLETS]

## 2024-12-20 LAB — LIPID PANEL
Cholesterol: 198 mg/dL
HDL: 74 mg/dL
LDL Cholesterol (Calc): 104 mg/dL — ABNORMAL HIGH
Non-HDL Cholesterol (Calc): 124 mg/dL
Total CHOL/HDL Ratio: 2.7 (calc)
Triglycerides: 106 mg/dL

## 2024-12-20 LAB — CBC
HCT: 41 % (ref 35.9–46.0)
Hemoglobin: 13.8 g/dL (ref 11.7–15.5)
MCH: 31.9 pg (ref 27.0–33.0)
MCHC: 33.7 g/dL (ref 31.6–35.4)
MCV: 94.7 fL (ref 81.4–101.7)
MPV: 11.3 fL (ref 7.5–12.5)
Platelets: 299 10*3/uL (ref 140–400)
RBC: 4.33 Million/uL (ref 3.80–5.10)
RDW: 12.6 % (ref 11.0–15.0)
WBC: 5.7 10*3/uL (ref 3.8–10.8)

## 2024-12-20 LAB — TEST AUTHORIZATION: TEST NAME:: 7600

## 2024-12-20 LAB — HIV RNA, RTPCR W/R GT (RTI, PI,INT)
HIV 1 RNA Quant: NOT DETECTED {copies}/mL
HIV-1 RNA Quant, Log: NOT DETECTED {Log_copies}/mL

## 2024-12-20 LAB — SYPHILIS: RPR W/REFLEX TO RPR TITER AND TREPONEMAL ANTIBODIES, TRADITIONAL SCREENING AND DIAGNOSIS ALGORITHM: RPR Ser Ql: NONREACTIVE

## 2024-12-21 ENCOUNTER — Ambulatory Visit: Payer: Self-pay | Admitting: Infectious Diseases

## 2024-12-25 ENCOUNTER — Other Ambulatory Visit: Payer: Self-pay | Admitting: Nurse Practitioner

## 2024-12-25 DIAGNOSIS — F4310 Post-traumatic stress disorder, unspecified: Secondary | ICD-10-CM

## 2024-12-25 DIAGNOSIS — G4700 Insomnia, unspecified: Secondary | ICD-10-CM

## 2024-12-25 DIAGNOSIS — F3342 Major depressive disorder, recurrent, in full remission: Secondary | ICD-10-CM

## 2024-12-25 NOTE — Telephone Encounter (Signed)
 Please advise [facility]

## 2025-03-04 ENCOUNTER — Ambulatory Visit: Payer: Self-pay | Admitting: Nurse Practitioner

## 2025-05-06 ENCOUNTER — Ambulatory Visit: Payer: Self-pay | Admitting: Infectious Diseases

## 2025-07-20 ENCOUNTER — Ambulatory Visit: Payer: Self-pay
